# Patient Record
Sex: Female | Born: 1935
Health system: Southern US, Community
[De-identification: ages and names within clinical notes are randomized; demographics above are authoritative.]

## PROBLEM LIST (undated history)

## (undated) DIAGNOSIS — K219 Gastro-esophageal reflux disease without esophagitis: Secondary | ICD-10-CM

## (undated) DIAGNOSIS — E785 Hyperlipidemia, unspecified: Secondary | ICD-10-CM

## (undated) DIAGNOSIS — I10 Essential (primary) hypertension: Secondary | ICD-10-CM

## (undated) DIAGNOSIS — E119 Type 2 diabetes mellitus without complications: Secondary | ICD-10-CM

## (undated) DIAGNOSIS — I340 Nonrheumatic mitral (valve) insufficiency: Secondary | ICD-10-CM

## (undated) DIAGNOSIS — M1990 Unspecified osteoarthritis, unspecified site: Secondary | ICD-10-CM

## (undated) HISTORY — DX: Essential (primary) hypertension: I10

## (undated) HISTORY — PX: SPINE SURGERY: SHX786

## (undated) HISTORY — DX: Type 2 diabetes mellitus without complications: E11.9

## (undated) HISTORY — PX: ABDOMINAL HYSTERECTOMY: SHX81

## (undated) HISTORY — DX: Unspecified osteoarthritis, unspecified site: M19.90

## (undated) HISTORY — DX: Hyperlipidemia, unspecified: E78.5

## (undated) HISTORY — PX: TOTAL HIP ARTHROPLASTY: SHX124

## (undated) HISTORY — DX: Gastro-esophageal reflux disease without esophagitis: K21.9

## (undated) HISTORY — PX: BACK SURGERY: SHX140

## (undated) HISTORY — PX: JOINT REPLACEMENT: SHX530

## (undated) HISTORY — PX: FRACTURE SURGERY: SHX138

## (undated) HISTORY — PX: PARTIAL HYSTERECTOMY: SHX80

## (undated) HISTORY — DX: Nonrheumatic mitral (valve) insufficiency: I34.0

---

## 1998-10-26 ENCOUNTER — Ambulatory Visit (HOSPITAL_COMMUNITY): Admission: RE | Admit: 1998-10-26 | Discharge: 1998-10-26 | Payer: Self-pay | Admitting: Family Medicine

## 2000-03-19 ENCOUNTER — Encounter: Payer: Self-pay | Admitting: Family Medicine

## 2000-03-19 ENCOUNTER — Ambulatory Visit (HOSPITAL_COMMUNITY): Admission: RE | Admit: 2000-03-19 | Discharge: 2000-03-19 | Payer: Self-pay | Admitting: Family Medicine

## 2000-03-25 ENCOUNTER — Other Ambulatory Visit: Admission: RE | Admit: 2000-03-25 | Discharge: 2000-03-25 | Payer: Self-pay | Admitting: Obstetrics and Gynecology

## 2000-04-01 ENCOUNTER — Encounter (INDEPENDENT_AMBULATORY_CARE_PROVIDER_SITE_OTHER): Payer: Self-pay

## 2000-04-01 ENCOUNTER — Ambulatory Visit (HOSPITAL_COMMUNITY): Admission: RE | Admit: 2000-04-01 | Discharge: 2000-04-01 | Payer: Self-pay | Admitting: Obstetrics and Gynecology

## 2000-09-10 ENCOUNTER — Ambulatory Visit (HOSPITAL_COMMUNITY): Admission: RE | Admit: 2000-09-10 | Discharge: 2000-09-10 | Payer: Self-pay | Admitting: Neurosurgery

## 2000-09-10 ENCOUNTER — Encounter: Payer: Self-pay | Admitting: Neurosurgery

## 2000-10-02 ENCOUNTER — Encounter: Payer: Self-pay | Admitting: Neurosurgery

## 2000-10-02 ENCOUNTER — Ambulatory Visit (HOSPITAL_COMMUNITY): Admission: RE | Admit: 2000-10-02 | Discharge: 2000-10-02 | Payer: Self-pay | Admitting: Neurosurgery

## 2000-10-16 ENCOUNTER — Ambulatory Visit (HOSPITAL_COMMUNITY): Admission: RE | Admit: 2000-10-16 | Discharge: 2000-10-16 | Payer: Self-pay | Admitting: Neurosurgery

## 2000-10-16 ENCOUNTER — Encounter: Payer: Self-pay | Admitting: Neurosurgery

## 2000-10-30 ENCOUNTER — Encounter: Payer: Self-pay | Admitting: Neurosurgery

## 2000-10-30 ENCOUNTER — Encounter: Admission: RE | Admit: 2000-10-30 | Discharge: 2000-10-30 | Payer: Self-pay | Admitting: Neurosurgery

## 2000-11-30 ENCOUNTER — Encounter (HOSPITAL_COMMUNITY): Admission: RE | Admit: 2000-11-30 | Discharge: 2000-12-30 | Payer: Self-pay | Admitting: Neurosurgery

## 2001-01-01 ENCOUNTER — Encounter (HOSPITAL_COMMUNITY): Admission: RE | Admit: 2001-01-01 | Discharge: 2001-01-31 | Payer: Self-pay | Admitting: Neurosurgery

## 2001-02-04 ENCOUNTER — Encounter (HOSPITAL_COMMUNITY): Admission: RE | Admit: 2001-02-04 | Discharge: 2001-03-06 | Payer: Self-pay | Admitting: Neurosurgery

## 2001-03-09 ENCOUNTER — Encounter (HOSPITAL_COMMUNITY): Admission: RE | Admit: 2001-03-09 | Discharge: 2001-04-08 | Payer: Self-pay | Admitting: Neurosurgery

## 2001-07-16 ENCOUNTER — Ambulatory Visit (HOSPITAL_COMMUNITY): Admission: RE | Admit: 2001-07-16 | Discharge: 2001-07-16 | Payer: Self-pay | Admitting: Family Medicine

## 2001-07-16 ENCOUNTER — Encounter: Payer: Self-pay | Admitting: Family Medicine

## 2001-08-26 ENCOUNTER — Other Ambulatory Visit: Admission: RE | Admit: 2001-08-26 | Discharge: 2001-08-26 | Payer: Self-pay | Admitting: Specialist

## 2001-10-27 ENCOUNTER — Ambulatory Visit (HOSPITAL_COMMUNITY): Admission: RE | Admit: 2001-10-27 | Discharge: 2001-10-27 | Payer: Self-pay | Admitting: General Surgery

## 2001-11-15 ENCOUNTER — Encounter: Payer: Self-pay | Admitting: Neurosurgery

## 2001-11-15 ENCOUNTER — Ambulatory Visit (HOSPITAL_COMMUNITY): Admission: RE | Admit: 2001-11-15 | Discharge: 2001-11-15 | Payer: Self-pay | Admitting: Neurosurgery

## 2001-12-24 ENCOUNTER — Encounter: Payer: Self-pay | Admitting: Neurosurgery

## 2001-12-28 ENCOUNTER — Encounter: Payer: Self-pay | Admitting: Neurosurgery

## 2001-12-28 ENCOUNTER — Inpatient Hospital Stay (HOSPITAL_COMMUNITY): Admission: RE | Admit: 2001-12-28 | Discharge: 2002-01-03 | Payer: Self-pay | Admitting: Neurosurgery

## 2002-06-14 ENCOUNTER — Encounter (HOSPITAL_COMMUNITY): Admission: RE | Admit: 2002-06-14 | Discharge: 2002-07-14 | Payer: Self-pay | Admitting: Neurosurgery

## 2002-07-19 ENCOUNTER — Encounter (HOSPITAL_COMMUNITY): Admission: RE | Admit: 2002-07-19 | Discharge: 2002-08-18 | Payer: Self-pay | Admitting: Neurosurgery

## 2002-08-30 ENCOUNTER — Encounter (HOSPITAL_COMMUNITY): Admission: RE | Admit: 2002-08-30 | Discharge: 2002-09-29 | Payer: Self-pay | Admitting: Neurosurgery

## 2003-02-08 ENCOUNTER — Encounter: Payer: Self-pay | Admitting: Orthopedic Surgery

## 2003-02-14 ENCOUNTER — Inpatient Hospital Stay (HOSPITAL_COMMUNITY): Admission: RE | Admit: 2003-02-14 | Discharge: 2003-02-17 | Payer: Self-pay | Admitting: Orthopedic Surgery

## 2003-02-14 ENCOUNTER — Encounter: Payer: Self-pay | Admitting: Orthopedic Surgery

## 2003-02-16 ENCOUNTER — Encounter: Payer: Self-pay | Admitting: Orthopedic Surgery

## 2003-02-17 ENCOUNTER — Inpatient Hospital Stay (HOSPITAL_COMMUNITY)
Admission: RE | Admit: 2003-02-17 | Discharge: 2003-03-03 | Payer: Self-pay | Admitting: Physical Medicine & Rehabilitation

## 2003-05-22 ENCOUNTER — Encounter: Payer: Self-pay | Admitting: Orthopedic Surgery

## 2003-05-22 ENCOUNTER — Encounter: Admission: RE | Admit: 2003-05-22 | Discharge: 2003-05-22 | Payer: Self-pay | Admitting: Orthopedic Surgery

## 2003-06-13 ENCOUNTER — Inpatient Hospital Stay (HOSPITAL_COMMUNITY): Admission: RE | Admit: 2003-06-13 | Discharge: 2003-06-16 | Payer: Self-pay | Admitting: Orthopedic Surgery

## 2003-06-16 ENCOUNTER — Inpatient Hospital Stay (HOSPITAL_COMMUNITY)
Admission: RE | Admit: 2003-06-16 | Discharge: 2003-06-23 | Payer: Self-pay | Admitting: Physical Medicine & Rehabilitation

## 2003-09-04 ENCOUNTER — Encounter: Admission: RE | Admit: 2003-09-04 | Discharge: 2003-09-04 | Payer: Self-pay | Admitting: Family Medicine

## 2005-01-07 ENCOUNTER — Encounter: Admission: RE | Admit: 2005-01-07 | Discharge: 2005-01-07 | Payer: Self-pay | Admitting: Family Medicine

## 2005-10-14 ENCOUNTER — Ambulatory Visit (HOSPITAL_COMMUNITY): Admission: RE | Admit: 2005-10-14 | Discharge: 2005-10-14 | Payer: Self-pay | Admitting: Family Medicine

## 2006-03-17 ENCOUNTER — Ambulatory Visit (HOSPITAL_COMMUNITY): Admission: RE | Admit: 2006-03-17 | Discharge: 2006-03-17 | Payer: Self-pay | Admitting: Internal Medicine

## 2007-03-25 ENCOUNTER — Ambulatory Visit (HOSPITAL_COMMUNITY): Admission: RE | Admit: 2007-03-25 | Discharge: 2007-03-25 | Payer: Self-pay | Admitting: Internal Medicine

## 2007-10-01 ENCOUNTER — Ambulatory Visit (HOSPITAL_COMMUNITY): Admission: RE | Admit: 2007-10-01 | Discharge: 2007-10-01 | Payer: Self-pay | Admitting: Internal Medicine

## 2007-10-14 ENCOUNTER — Other Ambulatory Visit: Admission: RE | Admit: 2007-10-14 | Discharge: 2007-10-14 | Payer: Self-pay | Admitting: Obstetrics and Gynecology

## 2007-11-17 ENCOUNTER — Encounter (INDEPENDENT_AMBULATORY_CARE_PROVIDER_SITE_OTHER): Payer: Self-pay | Admitting: Internal Medicine

## 2007-11-17 ENCOUNTER — Ambulatory Visit (HOSPITAL_COMMUNITY): Admission: RE | Admit: 2007-11-17 | Discharge: 2007-11-17 | Payer: Self-pay | Admitting: Internal Medicine

## 2008-08-16 ENCOUNTER — Ambulatory Visit: Payer: Self-pay | Admitting: Cardiology

## 2008-08-16 DIAGNOSIS — R0602 Shortness of breath: Secondary | ICD-10-CM | POA: Insufficient documentation

## 2008-08-21 ENCOUNTER — Ambulatory Visit: Payer: Self-pay | Admitting: Cardiology

## 2008-08-21 ENCOUNTER — Encounter (HOSPITAL_COMMUNITY): Admission: RE | Admit: 2008-08-21 | Discharge: 2008-09-20 | Payer: Self-pay | Admitting: Cardiology

## 2008-08-22 ENCOUNTER — Encounter: Payer: Self-pay | Admitting: Cardiology

## 2008-09-04 ENCOUNTER — Ambulatory Visit: Payer: Self-pay | Admitting: Cardiology

## 2010-03-28 ENCOUNTER — Other Ambulatory Visit: Admission: RE | Admit: 2010-03-28 | Discharge: 2010-03-28 | Payer: Self-pay | Admitting: Obstetrics and Gynecology

## 2010-04-09 ENCOUNTER — Ambulatory Visit (HOSPITAL_COMMUNITY): Admission: RE | Admit: 2010-04-09 | Discharge: 2010-04-09 | Payer: Self-pay | Admitting: Ophthalmology

## 2010-05-21 ENCOUNTER — Emergency Department (HOSPITAL_COMMUNITY): Admission: EM | Admit: 2010-05-21 | Discharge: 2010-05-21 | Payer: Self-pay | Admitting: Emergency Medicine

## 2010-08-17 ENCOUNTER — Encounter: Payer: Self-pay | Admitting: Family Medicine

## 2010-08-18 ENCOUNTER — Encounter: Payer: Self-pay | Admitting: Nephrology

## 2010-08-18 ENCOUNTER — Encounter: Payer: Self-pay | Admitting: Internal Medicine

## 2010-09-19 ENCOUNTER — Other Ambulatory Visit: Payer: Self-pay | Admitting: Obstetrics and Gynecology

## 2010-09-19 ENCOUNTER — Encounter (HOSPITAL_COMMUNITY): Payer: Medicare Other | Attending: Obstetrics and Gynecology

## 2010-09-19 DIAGNOSIS — Z01812 Encounter for preprocedural laboratory examination: Secondary | ICD-10-CM | POA: Insufficient documentation

## 2010-09-19 LAB — CBC
MCH: 29.2 pg (ref 26.0–34.0)
MCHC: 34 g/dL (ref 30.0–36.0)
MCV: 86 fL (ref 78.0–100.0)
Platelets: 216 10*3/uL (ref 150–400)
RBC: 4.35 MIL/uL (ref 3.87–5.11)
RDW: 14.5 % (ref 11.5–15.5)

## 2010-09-19 LAB — DIFFERENTIAL
Eosinophils Absolute: 0.1 10*3/uL (ref 0.0–0.7)
Eosinophils Relative: 2 % (ref 0–5)
Lymphs Abs: 2.2 10*3/uL (ref 0.7–4.0)
Monocytes Absolute: 0.4 10*3/uL (ref 0.1–1.0)
Monocytes Relative: 7 % (ref 3–12)

## 2010-09-19 LAB — COMPREHENSIVE METABOLIC PANEL
ALT: 16 U/L (ref 0–35)
AST: 20 U/L (ref 0–37)
CO2: 27 mEq/L (ref 19–32)
Chloride: 103 mEq/L (ref 96–112)
Creatinine, Ser: 1.02 mg/dL (ref 0.4–1.2)
GFR calc Af Amer: >60 mL/min (ref >60)
GFR calc non Af Amer: 53 mL/min — ABNORMAL LOW (ref >60)
Total Bilirubin: 0.7 mg/dL (ref 0.3–1.2)

## 2010-09-24 ENCOUNTER — Ambulatory Visit (HOSPITAL_COMMUNITY)
Admission: RE | Admit: 2010-09-24 | Discharge: 2010-09-24 | Disposition: A | Discharge status: Home / Self Care | Payer: Medicare Other | Source: Ambulatory Visit | Attending: Obstetrics and Gynecology | Admitting: Obstetrics and Gynecology

## 2010-09-24 DIAGNOSIS — Z538 Procedure and treatment not carried out for other reasons: Secondary | ICD-10-CM | POA: Insufficient documentation

## 2010-09-24 DIAGNOSIS — N816 Rectocele: Secondary | ICD-10-CM | POA: Insufficient documentation

## 2010-09-24 DIAGNOSIS — N8110 Cystocele, unspecified: Secondary | ICD-10-CM | POA: Insufficient documentation

## 2010-10-04 ENCOUNTER — Encounter (HOSPITAL_COMMUNITY): Payer: Medicare Other | Attending: Obstetrics and Gynecology

## 2010-10-04 ENCOUNTER — Other Ambulatory Visit: Payer: Self-pay | Admitting: Obstetrics and Gynecology

## 2010-10-04 DIAGNOSIS — Z01812 Encounter for preprocedural laboratory examination: Secondary | ICD-10-CM | POA: Insufficient documentation

## 2010-10-06 NOTE — H&P (Signed)
  NAME:  Jasmine Bridges, Jasmine Bridges NO.:  [id number]  MEDICAL RECORD NO.:  [id number]           PATIENT TYPE:  LOCATION:                                 FACILITY:  PHYSICIAN:  Tilda Burrow, M.D. DATE OF BIRTH:  [birth year]  DATE OF ADMISSION: DATE OF DISCHARGE:  LH                             HISTORY & PHYSICAL   ADMITTING DIAGNOSIS:  Uterine descent, uterine prolapse with cystourethrocele and rectocele.  HISTORY OF PRESENT ILLNESS:  This 77 year old female, many years postmenopausal, patient of Dr. Catalina Pizza, with known cystocele and uterine descensus, has now been scheduled for vaginal hysterectomy, and anterior and posterior repair.  This was rescheduling of procedure originally planned for September 19, 2010, but cancelled due to inadequate bowel prep and due to miscommunications.  The patient has now been rescheduled and completed bowel prep.  Risks of procedure have been reviewed adequately with the patient, questions answered.  PAST MEDICAL HISTORY:  Medical history is positive for hypertension. Surgical history of D and C that was pregnancy related, premenopausal many years ago;  back operations in 2003 and two prior episodes of hip replacements bilaterally.  No GYN surgery.  She is a gravida 4, para 4-0- 0-3.  She has no allergies.  She does have some chronic medical history additionally identified as chronic renal insufficiency; diabetes mellitus, type 2; leg and back pain; hyperlipidemia; history of urinary tract infections; history of gout; history of intermittent problems with vertigo, currently stable.  PHYSICAL EXAMINATION:  Notable for the adequate introitus support but with uterine prolapse through the introitus with activity, spontaneously reducible when at rest.  PLAN:  Vaginal hysterectomy, anterior and posterior repair, possible removal of tubes and ovaries depending on accessibility.  Risks of procedure including injury to adjacent organs  reviewed with the patient who was comfortable with procedure.  Bowel prep performed.  Plan vaginal hysterectomy on October 08, 2010, at [facility].     Tilda Burrow, M.D.     JVF/MEDQ  D:  10/03/2010  T:  10/04/2010  Job:  [id number]  Electronically Signed by Christin Bach M.D. on 10/06/2010 12:30:57 PM

## 2010-10-08 ENCOUNTER — Inpatient Hospital Stay (HOSPITAL_COMMUNITY)
Admission: RE | Admit: 2010-10-08 | Discharge: 2010-10-09 | DRG: 743 | Disposition: A | Discharge status: Home / Self Care | Payer: Medicare Other | Source: Ambulatory Visit | Attending: Obstetrics and Gynecology | Admitting: Obstetrics and Gynecology

## 2010-10-08 ENCOUNTER — Other Ambulatory Visit: Payer: Self-pay | Admitting: Obstetrics and Gynecology

## 2010-10-08 DIAGNOSIS — N814 Uterovaginal prolapse, unspecified: Principal | ICD-10-CM | POA: Diagnosis present

## 2010-10-08 DIAGNOSIS — I10 Essential (primary) hypertension: Secondary | ICD-10-CM | POA: Diagnosis present

## 2010-10-09 LAB — BASIC METABOLIC PANEL
Calcium: 8.8 mg/dL (ref 8.4–10.5)
Calcium: 9.1 mg/dL (ref 8.4–10.5)
Creatinine, Ser: 0.99 mg/dL (ref 0.4–1.2)
GFR calc Af Amer: >60 mL/min (ref >60)
GFR calc Af Amer: 56 mL/min — ABNORMAL LOW (ref >60)
GFR calc non Af Amer: 46 mL/min — ABNORMAL LOW (ref >60)
Sodium: 135 mEq/L (ref 135–145)

## 2010-10-09 LAB — DIFFERENTIAL
Basophils Relative: 0 % (ref 0–1)
Basophils Relative: 0 % (ref 0–1)
Eosinophils Absolute: 0.1 10*3/uL (ref 0.0–0.7)
Lymphs Abs: 2.3 10*3/uL (ref 0.7–4.0)
Monocytes Absolute: 1.1 10*3/uL — ABNORMAL HIGH (ref 0.1–1.0)
Monocytes Relative: 12 % (ref 3–12)
Monocytes Relative: 5 % (ref 3–12)
Neutro Abs: 9.7 10*3/uL — ABNORMAL HIGH (ref 1.7–7.7)
Neutrophils Relative %: 67 % (ref 43–77)
Neutrophils Relative %: 75 % (ref 43–77)

## 2010-10-09 LAB — CBC
Hemoglobin: 12.6 g/dL (ref 12.0–15.0)
MCH: 29.8 pg (ref 26.0–34.0)
MCHC: 34.6 g/dL (ref 30.0–36.0)
MCHC: 33.4 g/dL (ref 30.0–36.0)
Platelets: 185 10*3/uL (ref 150–400)
RBC: 4.26 MIL/uL (ref 3.87–5.11)

## 2010-10-10 LAB — POCT I-STAT 4, (NA,K, GLUC, HGB,HCT): Potassium: 3.5 mEq/L (ref 3.5–5.1)

## 2010-10-10 LAB — CROSSMATCH
ABO/RH(D): A POS
Antibody Screen: NEGATIVE
Unit division: 0
Unit division: 0

## 2010-10-11 NOTE — Op Note (Signed)
NAME:  Jasmine Bridges, Jasmine Bridges                  ACCOUNT NO.:  [id number]  MEDICAL RECORD NO.:  [id number]           PATIENT TYPE:  I  LOCATION:  A339                          FACILITY:  [facility]  PHYSICIAN:  Tilda Burrow, M.D. DATE OF BIRTH:  03/13/1933  DATE OF PROCEDURE:  10/08/2010 DATE OF DISCHARGE:                              OPERATIVE REPORT   PREOPERATIVE DIAGNOSES:  Uterine prolapse, cystourethrocele, small rectocele.  PREOPERATIVE DIAGNOSES:  Uterine prolapse, cystourethrocele, small rectocele.  PROCEDURE:  Vaginal hysterectomy, bilateral salpingo-oophorectomy, anterior and posterior repair.  SURGEON:  Tilda Burrow, M.D.  ASSISTANTBirdie Riddle, CST  ANESTHESIA:  Spinal with Reuel Boom, CRNA  COMPLICATIONS:  None.  FINDINGS:  Second degree cystourethrocele and cervical protrusion through the introitus, small rectocele, tiny ovaries bilaterally.  DETAILS OF THE PROCEDURE:  The patient was taken to the operating room. Spinal anesthesia introduced.  The patient prepped and draped positioned properly in high lithotomy support and a time-out conducted and procedure confirmed by all involved parties.  A circumferential incision around the cervix was performed using sharp knife dissection after injection with Marcaine with epinephrine and then a posterior colpotomy incision performed.  The bladder could be sharply dissected off the anterior lower uterine segment and elevated easily.  The uterosacral ligaments could be identified on either side, clamped, cut, and suture ligated and tagged for future identification.  The lower cardinal ligaments were then clamped, cut, and suture ligated being maintaining particular care to stay close to the uterus and take small bites.  The upper cardinal ligament was serially taken down with small bites in similar fashion and then the broad ligament serially clamped, cut, and suture ligated.  Prior to working on the broad ligament, the  anterior vesicouterine peritoneum was opened by placing a finger around the uterine fundus identifying the peritoneum and opening it near to the uterus.  Upon reaching the level of the round ligaments, the pedicle was clamped, cut, ligated, and tied and then the tip of the utero-ovarian ligament and fallopian tube taken down as a separate specimen.  This was also tagged.  The uterus was removed and using Allis clamps for traction and counter traction, we could identify first the left tube and ovary. There was some thin filmy adhesions attaching the ovary to the sidewall, but these could be mobilized, so that the and ovary could be clearly visualized and then specimen crossclamped with Zeppelin clamp.  The pedicle was ligated with 0 chromic maintaining close proximity to the ovary.  The right tube and ovary was clearly visible without any adhesions and easily identified with the mesosalpinx taken down and tied with good hemostasis.  Specimens were passed off.  A small suture of 2-0 Prolene was then placed grasping the inner aspects of the uterosacral ligament on each side, going from lateral to medial and incorporating a small portion of the rectovaginal septum in order to reduce potential for subsequent postop enterocele.  These were tied down and cut shortly.  Anterior peritoneum was then closed to the posterior cul-de-sac peritoneum, and then sponge and needle counts complete, correct for peritoneal closure.  The anterior vaginal mucosa was then injected with an additional 10 mL of Marcaine solution.  The anterior epithelium dissected off of the underlying bladder.  Prior to beginning this dissection, the Foley catheterization was performed and 400 mL of clear urine obtained.  The redundant anterior vaginal epithelium was trimmed and then reapposed by pulling the remaining tissue to the vaginal cuff and suturing it with small interrupted bites of 2-0 chromic and then the midline  dissection was reapproximated with running 0 chromic.  This resulted in good anterior support.  Posterior repair.  Posterior repair consisted of infiltrating with another 10 mL of Marcaine with epinephrine in the rectovaginal septum, splitting the vaginal epithelium in a U-shaped incision just inside the introitus and bluntly dissecting the tissues laterally with small amount of sharp dissection to identify good tissue on either side.  With a double gloved right index finger inside the rectum for proper orientation, 2 Allis clamps could be placed on the lateral tissues that were felt to represent part of the levator plate that could be pulled into the midline with a series of interrupted sutures of 2-0 Vicryl, resulting in good, posterior perineal body support.  Total of 4 sutures were used.  The vaginal epithelium was trimmed slightly and pulled together in the midline with 2-0 chromic.  Sponge and needle counts were correct.  The patient tolerated the procedure well, went to recovery room in good condition with EBL 100 mL.  An additional 200 mL of urine was obtained during the duration of the case.     Tilda Burrow, M.D.     JVF/MEDQ  D:  10/08/2010  T:  10/09/2010  Job:  [id number]  cc:   [facility]  Electronically Signed by Christin Bach M.D. on 10/11/2010 01:45:31 PM

## 2010-11-08 NOTE — Discharge Summary (Signed)
  NAME:  Jasmine Bridges, Jasmine Bridges                  ACCOUNT NO.:  [id number]  MEDICAL RECORD NO.:  [id number]           PATIENT TYPE:  LOCATION:                                 FACILITY:  PHYSICIAN:  Tilda Burrow, M.D. DATE OF BIRTH:  [birth year]  DATE OF ADMISSION:  10/09/2010 DATE OF DISCHARGE:  03/15/2012LH                              DISCHARGE SUMMARY   ADMITTING DIAGNOSES: 1. Uterine prolapse. 2. Cystocele. 3. Rectocele. 4. History of Staphylococcus aureus carrier, status post Bactroban     treatment.  DISCHARGE DIAGNOSES: 1. Uterine prolapse. 2. Cystocele. 3. Rectocele. 4. History of Staphylococcus aureus carrier, status post Bactroban     treatment.  PROCEDURES:  Vaginal hysterectomy, bilateral salpingo-oophorectomy, anterior and posterior repair.  DISCHARGE MEDICATIONS:  New ones: 1. Ibuprofen 600 mg q.6 h. p.r.n. pain. 2. MiraLax 17 g by mouth daily for prevention of constipation. 3. Oxycodone/APAP 5/325 one p.o. q.6 h. p.r.n. pain  Continued medications: 1. Allopurinol 100 mg 1 p.o. daily. 2. Alprazolam 0.25 mg 1 p.o. at bedtime p.r.n. 3. Meclizine 25 mg p.o. daily as needed. 4. Multivitamins 1 daily. 5. Neurontin (gabapentin) 300 mg oral solution twice daily. 6. Spirolactone 25 mg p.o. daily. 7. Torsemide 1-2 tablets daily as needed for leg cramps.  HOSPITAL SUMMARY:  This 77 year old female was admitted for vaginal hysterectomy, bilateral salpingo-oophorectomy, A and P repair as described in the HPI.  The patient underwent hysterectomy in an uncomplicated fashion with 100 mL of EBL.  Postoperatively, the patient remained stable overnight, good clear urine obtained.  Pain control was quite satisfactory.  Following day the patient was discharged with postoperative hemoglobin 11, hematocrit 33, platelets 185,000, and white count 9700.  Admitting hemoglobin 12.7.  Glucose was 106, potassium 4.4.  Blood pressure 100/70, pulse 80.  Abdomen was soft . vaginal packing  was removed.  The patient was able to void spontaneously.  She will be followed up in 1 week, then 4 weeks at [facility].     Tilda Burrow, M.D.     JVF/MEDQ  D:  11/04/2010  T:  11/05/2010  Job:  [id number]  Electronically Signed by Christin Bach M.D. on 11/08/2010 02:41:13 PM

## 2010-12-10 NOTE — Assessment & Plan Note (Signed)
[facility]                       [place] [facility] OFFICE NOTE   NAME:Richens, AMARE KONTOS                         MRN:          [id number]  DATE:08/16/2008                            DOB:          [birth year]    REQUESTING PHYSICIAN:  Catalina Pizza, MD   REASON FOR CONSULTATION:  Progressive shortness of breath.   HISTORY OF PRESENT ILLNESS:  Ms. Kyte is a pleasant 77 year old woman  with a history of hypertension, hyperlipidemia, chronic intermittent  lower extremity edema, and chronic back pain with previous back surgery  several years ago.  She reports a 2 to 3-year history of progressive  shortness of breath, now NYHA class III in severity.  She has not had  any problems with exertional chest pain and denies orthopnea and PND.  She does have lower extremity edema that is dependent and usually better  when she is able to rest and put her feet up.  She has no personal  history of ischemic heart disease or myocardial infarction.  I was able  to review records and noted an echocardiogram done back in April of last  year demonstrating a left ventricular ejection fraction in the normal  range with no regional wall motion abnormalities.  Moderate mitral  regurgitation was described at that time.  Her electrocardiogram today  shows sinus rhythm with premature atrial complexes and nonspecific ST-T  wave changes.  She is referred for further evaluation.   ALLERGIES:  No known drug allergies.   PRESENT MEDICATIONS:  1. Potassium chloride 10 mEq p.o. daily.  2. Meloxicam 7.5 mg p.o. b.i.d.  3. Gabapentin 300 mg p.o. b.i.d.  4. Spirolactone 25 mg p.o. daily,  5. Torsemide 20 mg p.o. daily.  6. Simvastatin 20 mg p.o. daily.  7. Alprazolam 0.25 mg p.o. nightly.  8. Multivitamin 1 p.o. daily.  9. Calcium citrate with vitamin D 250 mg p.o. daily.  10.Alendronate 70 mg weekly.   PAST MEDICAL HISTORY:  As detailed above.  She has undergone prior hip  replacement and back operations in [place].  She has known  osteoarthritis and acid reflux.   SOCIAL HISTORY:  No active tobacco abuse or alcohol use.  She has not  been exercising regularly, but did do some water aerobics in last  summer.   FAMILY HISTORY:  Reviewed and significant for cardiovascular disease.  Her father died with myocardial infarction suddenly at age of 46 and she  also reports that she has a brother who suffered with cardiovascular  disease.  She states that her daughter was diagnosed with mitral valve  prolapse.   REVIEW OF SYSTEMS:  As outlined above.  She has had some trouble with  constipation and also leg heaviness since taking Zocor which she  apparently was recently placed back on.  She has occasional reflux  symptoms.  Reports her last mammogram in 2009.  No claudication  symptoms.  She does have a lot of trouble with her back complaining of  pain when she bends over and does any short of housework.  Otherwise  negative.   PHYSICAL EXAMINATION:  VITAL SIGNS:  Blood pressure is 136/79, heart  rate is 80-90 and regular, and weight is 201 pounds.  GENERAL:  This is an overweight woman in no acute distress.  HEENT:  Conjunctivae are normal.  Oropharynx is clear.  NECK:  Supple.  No elevated jugular venous pressure.  No loud bruits.  LUNGS:  Clear without labored breathing at rest.  CARDIAC:  Regular rate and rhythm with what sounds like a  physiologically split S2.  Soft apical systolic murmur.  No pericardial  rub.  ABDOMEN:  Nontender.  Normoactive bowel sounds.  No hepatomegaly.  EXTREMITIES:  Exhibit trace symmetrical edema.  Distal pulses are 1+.  SKIN:  Warm and dry.  MUSCULOSKELETAL:  No kyphosis noted.  NEUROPSYCHIATRIC:  The patient alert and oriented x3.  Affect is  appropriate   IMPRESSION AND RECOMMENDATIONS:  Progressive shortness of breath, now  described at [facility] class III.  No exertional chest  pain is reported,  otherwise only occasional palpitations, but no  syncope.  Electrocardiogram is nonspecific at this point with occasional  premature atrial complexes.  She does have a longstanding history of  hypertension and also hyperlipidemia.  No prolonged tobacco use history.  In reviewing her records, I find an echocardiogram from last April,  which did describe moderate mitral regurgitation by report.  I discussed  this with the patient today. Would further evaluate with a repeat  echocardiogram to assess both degree of mitral regurgitation and also  assessment of left and right ventricular function.  Ischemic evaluation  will be obtained via a Lexiscan  Myoview on medical therapy.  I will  bring her back to the office over the next few weeks to discuss the  results.     Jonelle Sidle, MD  Electronically Signed    SGM/MedQ  DD: 08/16/2008  DT: 08/17/2008  Job #: [id number]   cc:   Catalina Pizza, M.D.

## 2010-12-10 NOTE — Assessment & Plan Note (Signed)
[facility]                       [place] [facility] OFFICE NOTE   NAME:Jasmine Bridges, Jasmine Bridges                         MRN:          [id number]  DATE:09/04/2008                            DOB:          12-25-1932    PRIMARY CARE PHYSICIAN:  Catalina Pizza, MD.   REASON FOR VISIT:  Followup cardiac testing.   HISTORY OF PRESENT ILLNESS:  I saw Jasmine Bridges back in late January.  She  was referred at that time with progressive shortness of breath.  She had  had previously documented moderate mitral regurgitation by prior  echocardiography.  She also has a family history of cardiovascular  disease.  I referred her for ischemic testing via a Lexiscan Myoview.  This was performed on January 25 and was reassuring demonstrating no  frank evidence of ischemia with an ejection fraction of 68%.  Echocardiography was also performed confirming normal left ventricular  systolic function and revealing only trivial mitral regurgitation with  mild mitral annular calcification.  I reviewed these studies with Ms.  Bridges today and reassured her.  It may well be that some of her  shortness of breath is related to congenital conditioning and lack of  regular exercise.   ALLERGIES:  No known drug allergies.   MEDICATIONS:  1. Potassium chloride 10 mEq p.o. daily.  2. Meloxicam 7.5 mg p.o. b.i.d.  3. Gabapentin 300 mg p.o. b.i.d.  4. Spirolactone 25 mg p.o. daily.  5. Torsemide 20 mg p.o. daily.  6. Simvastatin 10 mg p.o. daily.  7. Alprazolam 0.25 mg p.o. at bedtime.  8. Multivitamin 1 p.o. daily.  9. Calcium citrate with vitamin D 250 mg p.o. daily.  10.Alendronate 70 mg weekly.   REVIEW OF SYSTEMS:  As outlined above.  She states that she has cut her  simvastatin back from 20 mg to 10 mg daily and has had problem with  spotting and also leg discomfort.  Otherwise negative.   PHYSICAL EXAMINATION:  VITAL SIGNS:  Blood pressure 128/70, heart rate  is 70, weight is 201  pounds.  GENERAL:  The patient is comfortable, in no acute distress.  NECK:  No elevated jugular venous pressure.  No bruits.  No thyromegaly.  LUNGS:  Clear without labored breathing at rest.  CARDIAC:  Regular rate and rhythm, physiologically split S2.  Soft  apical systolic murmur without change.  EXTREMITIES:  Exhibit trace edema, nonpitting, 1+ distal pulses are  noted.   IMPRESSION AND RECOMMENDATIONS:  Shortness of breath without clear  evidence of underlying ischemic heart disease or progressive valvular  heart disease based on recent testing.  The patient has normal left  ventricular systolic function and only trace mitral regurgitation at  this point.  I have recommended that she return to her prior exercise  regimen, which previously included water aerobics.  It may be that some  of her symptoms are related to general deconditioning.  Otherwise, basic  cardiac risk factor modification is recommended.  Since she has had some  complaints even with low-dose Zocor, I have asked her to hold this until  she is able  to see Dr. Margo Aye back in followup.  We can see her as needed.     Jonelle Sidle, MD  Electronically Signed    SGM/MedQ  DD: 09/04/2008  DT: 09/05/2008  Job #: [id number]   cc:   Catalina Pizza, M.D.

## 2010-12-13 NOTE — H&P (Signed)
NAME:  Jasmine Bridges, Jasmine Bridges                            ACCOUNT NO.:  [id number]   MEDICAL RECORD NO.:  [id number]                   PATIENT TYPE:  INP   LOCATION:  NA                                   FACILITY:  [facility]   PHYSICIAN:  Rodney A. Chaney Malling, M.D.           DATE OF BIRTH:  October 20, 1932   DATE OF ADMISSION:  02/14/2003  DATE OF DISCHARGE:                                HISTORY & PHYSICAL   CHIEF COMPLAINT:  Bilateral hip pain.   HISTORY OF PRESENT ILLNESS:  The patient is a 77 year old female who had  bilateral hip pain over the past four to five months.  She states that her  hip is more bothersome than her right. She does note some popping and  locking in her left hip and a giving way sensation.  The pain is aggravated  by walking and rising from a chair and it radiates into her buttocks.  She  states that she gets very tired whenever she walks and then her she becomes  short of breath. The patient uses a walker to get around since having back  surgery in June of 2003.  She currently takes Darvocet, Neurontin, Vioxx,  Duragesic patch, and propoxyphen for pain in her back, knees, and hips.  X-  rays of her hips show end-stage osteoarthritis bilaterally with femoral head  collapse bilaterally.  The patient will undergo left total hip replacement  on February 14, 2003, by Dr. Chaney Malling at [facility].   ALLERGIES:  No known drug allergies.   CURRENT MEDICATIONS:  1. Darvocet-N 100 tablets p.r.n.  2. Protoxin N/APAP 100/650 three times a day.  3. Neurontin 300 mg capsule b.i.d.  4. Duragesic patch 25 mcg one patch every three days.  5. Fosamax 80 mg one tablet a week.  6. Vioxx 25 mg two tablets daily.  7. Spironolactone 25 mg daily.  8. Chlorocon 10 mEq one tablet daily.  9. Potassium chloride 10 mEq one tablet daily.  10.      Iron plus vitamin C 200 mg/125 two tablets daily.  11.      Calcium 500 mg three tablets daily.  12.      Stool softener two capsules daily.   PAST MEDICAL HISTORY:  1. Osteoarthritis bilateral hips.  2. Anemia.  3. Osteoporosis.   PAST SURGICAL HISTORY:  Surgery for ruptured disk in back x2.  Fusion of  lower back.  Cyst removal from spine.  Spur removal from spine. All of the  last three were done in June of 2003.  The patient reports no complications  with the above surgeries and no blood transfusions were required.   SOCIAL HISTORY:  The patient denies any tobacco use, alcohol use.  She is  widowed and has three adult children. Family physician is Annia Friendly. Loleta Chance,  M.D. Phone number is [phone].  The patient lives in a [place] home with  two steps to the usual entrance. The patient lives alone and would like  rehab if necessary.  The patient is retired, Statistician.   FAMILY HISTORY:  Mother deceased at age 33 with CVA.  Father deceased at 70  with MI. He also had a PE five years prior to his MI.  The patient has one  brother who is living at age 15 and only known health problem is back issues  that have required five back surgeries.   REVIEW OF SYSTEMS:  Positive for glasses.  The patient denies any PND,  orthopnea.  She does experience some exertional dyspnea and feels that this  is mainly due to the pain of trying to walk.  She does state that she has  been told at times that she has an irregular heartbeat and murmur, however,  lately neither her internalist or her neurosurgeon have heard a murmur.  The  patient does have nocturia x2.  She reports having a colonoscopy in 2003  that was negative.  Stress test three years ago was reported by the patient  to also be negative. Otherwise review of systems is negative.   PHYSICAL EXAMINATION:  GENERAL: The patient is a well-developed, well-  nourished, slightly overweight, black female who walks with an antalgic  gait. She has mild valgus deformities of both knees.  The patient uses a  walker and moves slowly to the examination room. The patient's mood and  affect  are appropriate. The patient talks easily with examiner.  VITAL SIGNS:  Height 5 foot 4-1/4 inches.  Weight 180 pounds.  Temperature  96 degrees, pulse 64, respiratory rate 18, blood pressure 142/80.  HEART: Regular rate and rhythm, no murmurs, rubs, or gallops noted.  LUNGS:  Clear to auscultation bilaterally without wheezes or rhonchi.  ABDOMEN:  Soft, nontender, positive bowel sounds in four quadrants.  NECK:  Trachea midline, no lymphadenopathy. Carotids 2+ without bruits.  No  tenderness along C-spine.  Full range of motion of the cervical spine.  BACK:  Back is nontender to palpation.  No TNL signs.  BREASTS:  GENITOURINARY:  RECTAL:  All deferred at this time.  HEENT:  Normocephalic and atraumatic without frontal or maxillary sinus  tenderness to palpation. Conjunctivae are pink. Sclerae nonicteric. PERRLA.  Extraocular movements intact. No visible external deformities.  Left TM is  pearly and gray.  Right ear TM is obscured by heavy cerumen.  Nose and nasal  septum midline. Nasal mucosa pink and moist without polyps. Buccal mucosa  pink and moist.  Good dentition. Pharynx without erythema or exudate. Tongue  and uvula are midline.  MUSCULOSKELETAL:  Upper extremities full range of motion. Neurovascularly  intact.  Radial pulses are 2+ bilaterally and are equal and symmetric.  Lower extremities; left hip internal rotation is nonexistent.  Limited  external rotation.  Right hip limited to internal and external rotation.  Hip flexion to approximately 90 degrees.  Right and left knees medial  compartment tenderness to palpation. Mild crepitus in both legs. The patient  fully extends both knees and flexes to 120 degrees.  Varus stressing reveals  no instability.  Dorsal pedal pulses 2+ bilaterally.  NEUROLOGY:  Alert and oriented x3.  Cranial nerves II-XII grossly intact.  5/5 upper extremity strength.  Upper DTR's 2+ bilaterally.  Lower extremities bilaterally strength 5/5.  Lower  DTR's are 2+ bilaterally.   IMPRESSION:  1. Osteoarthritis bilateral hips.  2. Probable osteoarthritis of both knees.  3. Anemia.  4. Osteoporosis.  The patient is to undergo left total hip replacement on February 14, 2003.  The  patient is given one unit of autologous blood.  She lives alone and requests  rehab after surgery.     Richardean Canal, P.A.                       Rodney A. Chaney Malling, M.D.    GC/MEDQ  D:  02/08/2003  T:  02/08/2003  Job:  [id number]

## 2010-12-13 NOTE — Op Note (Signed)
NAME:  Jasmine Bridges, Jasmine Bridges                            ACCOUNT NO.:  [id number]   MEDICAL RECORD NO.:  [id number]                   PATIENT TYPE:  INP   LOCATION:  2550                                 FACILITY:  [facility]   PHYSICIAN:  Rodney A. Chaney Malling, M.D.           DATE OF BIRTH:  10-30-1932   DATE OF PROCEDURE:  06/13/2003  DATE OF DISCHARGE:                                 OPERATIVE REPORT   PREOPERATIVE DIAGNOSES:  Severe osteoarthritis, right hip.   POSTOPERATIVE DIAGNOSES:  Severe osteoarthritis, right hip.   OPERATION PERFORMED:  Total hip replacement on the right using a 12 mm  femoral small component with a 100 series acetabular Pinnacle cup, 54 mm  outside diameter with Apex hole eliminator and a Pinnacle Marathon  acetabular liner and a +5 32 mm femoral head.   SURGEON:  Lenard Galloway. Chaney Malling, M.D.   ANESTHESIA:  General.   ASSISTANT:  1. Claude Manges. Cleophas Dunker, M.D.  2. Jamelle Rushing, P.A.   DESCRIPTION OF PROCEDURE:  The patient was placed on the operating table in  the supine position.  After satisfactory general anesthesia, the patient was  placed on the table in full lateral position.  The right hip was prepped  with DuraPrep and draped out in the usual manner.  A Vi-drape was placed  over the operative site.  A posterior Southern incision was made.  Skin  edges were retracted.  Bleeders were coagulated.  The tensor fascia lata was  split and the external hip rotators were identified and isolated.  A tag was  placed through the piriformis and this was released.  An incision was made  through the capsule from superior to inferior and excellent access to the  femoral head and neck was achieved.  The hip was then dislocated.  Using a  power saw, the femoral neck was then amputated at what was felt to be the  appropriate length, one fingerbreadth above the lesser trochanter.  Excellent exposure was achieved.  A series of fully fluted reamers then  passed down the femoral  canal and this was reamed out to an 11.5. A series  of broaches then passed down the femoral canal and this finalized with a 12  mm femoral component of a small size and this seated very nicely in the  calcar.  Calcar reamer was then used to smooth off the calcar.  The broach  was then removed.   Attention was then turned to the acetabulum.  Several osteophytes on the  acetabulum were removed.  A series of cheese cut reamers was then used and  this was reamed out to a 53 mm outside diameter.  Good bleeding bone was  achieved and nice depth was achieved.  A 52 trial seated very nicely and a  54 trial hung up on the rim.  The hip was irrigated with copious amounts of  antibiotic solution.  The final  Pinnacle 100 series acetabular cup 54 mm  outside diameter was driven down into the acetabulum.  An excellent scratch  fit was achieved.  This was extremely stable.  A trial of acetabular linings  were inserted and were screwed in place.  A femoral broach was passed down  the femoral canal.  Various sized necks were used.  The +5 mm neck seated  very nicely.  This made the leg lengths equal.  There was excellent stiff  leg in all positions. This was felt to be the appropriate size.  The hip was  then dislocated.  All the trial components were removed.  The hip was  irrigated again with antibiotic solution.  The Apex hole eliminator was  inserted.  A final Pinnacle Marathon acetabular liner was snap fitted in  place.  Femoral component was then passed down the femoral canal and seated  nicely in the calcar.  The +5 head was placed over the trainer and cold-  welded in place.  The hip was again reduced and again put through a full  range of motion.  There is excellent stability throughout.  The posterior  hip capsule was closed with heavy Ethibond sutures.  Ethibond was used to  close the tensor fascia lata and heavy Vicryl was used to close the deep and  subcutaneous tissues in several layers.   Stainless steel staples were then  used to close the skin.  Sterile dressing was applied.  The patient was then  transferred to the recovery room in excellent condition.  Technically this  procedure went extremely well.   DRAINS:  None.   COMPLICATIONS:  None.                                               Rodney A. Chaney Malling, M.D.    RAM/MEDQ  D:  06/13/2003  T:  06/13/2003  Job:  [id number]

## 2010-12-13 NOTE — H&P (Signed)
[place]  Patient:    Jasmine Bridges, Jasmine Bridges Visit Number: [id number] MRN: [id number]          Service Type: SUR Location: 3000 3027 01 Attending Physician:  Emeterio Reeve Dictated by:   Payton Doughty, M.D. Admit Date:  12/28/2001                           History and Physical  ADMITTING DIAGNOSIS:  Spondylosis at L4-5, L5-S1.  SERVICE:  Neurosurgery.  HISTORY OF PRESENT ILLNESS:  [age]-year-old right-handed black lady who, for the past few months, had increasing discomfort in her hip laterally and got into her back with a heavy feeling when she has been up for a while.  She feels as if she has to sit down.  She has numbness in her left foot, which is a residual of an old diskectomy by Drs. Ames and [employer]. [place].  She has had progressive difficulty with walking and an MRI shows she has a slip at L4-5 with a large synovial cyst, effacement of the anterior epidural space at 4-5 and a large broad-based disk with spurring and compression of both 5 roots.  She is barely ambulatory and is therefore admitted for decompression and fusion.  MEDICAL HISTORY:  Remarkable for hypertension.  MEDICATIONS: 1. Maxzide once a day. 2. Alprazolam 0.25 mg a day. 3. Prempro 0.625 mg a day. 4. Vioxx 25 mg a day. 5. An aspirin a day. 6. Vitamin E. 7. Tylenol and Aleve on a p.r.n. basis.  ALLERGIES:  She has no allergies.  PAST SURGICAL HISTORY:  Lumbar diskectomy at 4-5 in 1987 and 1994 and a D&C last November.  SOCIAL HISTORY:  She does not smoke or drink, is now a part-time Lawyer in [place].  FAMILY HISTORY:  Mama died of a stroke, daddy died of a heart attack, ages are not given.  REVIEW OF SYSTEMS:  Remarkable for glasses, nasal congestion, ______, sinus problems, hypertension, heart murmur, swelling in her hands and feet, leg pain when she is walking, shortness of breath, arm weakness, leg weakness, back pain, leg pain  and arthritis.  PHYSICAL EXAMINATION:  HEENT:  Within normal limits.  NECK:  She has reasonable range of motion in her neck.  CHEST:  Clear.  CARDIAC:  Regular rate and rhythm.  ABDOMEN:  Abdomen is nontender with no hepatosplenomegaly, although slightly large.  EXTREMITIES:  Without clubbing or cyanosis.  GU:  Deferred.  PERIPHERAL PULSES:  Good.  NEUROLOGIC:  She is awake, alert and oriented.  Her cranial nerves are intact. Motor exam shows 5/5 strength throughout the upper and lower extremities. There is no current sensory deficit.  Reflexes are a flicker at the knees and absent at the ankles.  Straight leg raise is positive at this time.  Walking causes a marked increase in her pain in both L5 and S1 distributions bilaterally as well as a feeling that her knees are buckling.  LABORATORY AND ACCESSORY DATA:  MRI results have been reviewed above.  CLINICAL IMPRESSION:  Lumbar spondylosis with neurogenic claudication causing gait deficit.  PLAN:  The plan is for lumbar laminectomy, diskectomy and posterior lumbar interbody fusion with Ray threaded fusion cages at L4-5 and L5-S1.  This will be augmented with pedicle screws.  The risks and benefits of this approach have been discussed with her and she wishes to proceed. Dictated by:   Payton Doughty, M.D. Attending Physician:  Emeterio Reeve DD:  12/28/01 TD:  12/29/01 Job: [id number]

## 2010-12-13 NOTE — Op Note (Signed)
[facility]  Patient:    Jasmine Bridges, Jasmine Bridges                         MRN: [id number] Proc. Date: 01/30/00 Adm. Date:  [id number] Attending:  Morene Antu                           Operative Report  PREOPERATIVE DIAGNOSIS:       Postmenopausal bleeding.  POSTOPERATIVE DIAGNOSIS:      Postmenopausal bleeding.  OPERATION:                    D&C and hysteroscopy.  SURGEON:                      Sherry A. Rosalio Macadamia, M.D.  ANESTHESIA:                   General.  INDICATIONS:                  This is a 77 year old G4, P3-1-0-3 woman who has been on Prempro for over 11 years.  The patient had 1-1/2 weeks of vaginal bleeding that has become very light and only with brown spotting at this time. The patient was seen in the office.  However, she refused to have an endometrial biopsy performed in the office.  The patient has a morbid fear of this procedure and would not allow this.  Therefore, the patient is brought to the operating room for [facility] and hysteroscopy.  FINDINGS:                     Eight to nine weeks size uterus, anteflexed.  No adnexal mass.  No polyp or abnormal endometrial tissue seen.  DESCRIPTION OF PROCEDURE:     The patient was brought into the operating room and given an adequate general anesthesia.  She was placed in the dorsolithotomy position.  The perineum and vagina were washed with Betadine. The bladder was in and out catheterized.  The patient was draped in a sterile fashion.  A speculum was placed into the vagina.  The anterior lip of the cervix was grasped with a single tooth tenaculum.  The cervix was sewn in with Shawnie Pons dilators to a #21.  The hysteroscope was easily introduced into the endometrial cavity.  Pictures were obtained.  No significant polyps or abnormal tissue was seen.  The hysteroscope was removed.  The cervix needed to be dilated slightly more to about a #23 to allow for a D&C.  Sharp curettage was performed  with only scant tissue present.  The hysteroscope was reintroduced and no other abnormality was seen.  Adequate hemostasis was present.  All instruments were removed from the vagina.                                The patient was taken out of the dorsolithotomy position.  She was awakened and she was extubated.  She was moved from the operating table to a stretcher in stable condition.  Complications were none. Estimated blood loss less than 5 cc. DD:  04/01/00 TD:  04/02/00 Job: [id number]

## 2010-12-13 NOTE — Discharge Summary (Signed)
NAME:  Jasmine Bridges, Jasmine Bridges                            ACCOUNT NO.:  [id number]   MEDICAL RECORD NO.:  [id number]                   PATIENT TYPE:  INP   LOCATION:  5037                                 FACILITY:  [facility]   PHYSICIAN:  Rodney A. Chaney Malling, M.D.           DATE OF BIRTH:  Mar 20, 1933   DATE OF ADMISSION:  06/13/2003  DATE OF DISCHARGE:  06/16/2003                                 DISCHARGE SUMMARY   ADMISSION DIAGNOSES:  1. Osteoarthritis of right hip.  2. Status post left total hip arthroplasty July 2004.  3. History of anemia.  4. Osteoporosis.  5. Hypertension.   DISCHARGE DIAGNOSES:  1. Status post right total hip arthroplasty.  2. Left total hip arthroplasty July 2004.  3. Osteoporosis.  4. Anemia.  5. Hypertension.   HISTORY OF PRESENT ILLNESS:  Jasmine Bridges is a 77 year old African-American  female with an 8- to 47-month history of right hip pain.  She has pain when  ambulating or rising from a chair or going from a standing to a lying-down  position.  The pain radiates into her buttocks and down her right leg.  Mechanical symptoms including giving away, painful popping sensation.  The  patient underwent a total hip arthroplasty in July 2004, which is doing  well.  The patient uses a walker to ambulate.  She walks with a limp on the  right side and has pain that wakes her at night.  X-rays of the right hip  showed complete loss of all articular cartilage with  a proximal migration  of the hip.  The patient was admitted to [facility] on June 13, 2003, and underwent a right hip replacement.   ALLERGIES:  No known drug allergies.   MEDICATIONS:  1. Fosamax 70 mg once each week.  2. Demadex 20 mg, 1 tablet daily.  3. Klor-Con 10 mEq, 1 tablet daily.  4. Neurontin 300 mg, 1 q.8h.  5. Darvocet-N 100, 1 tablet q.4-6h.  6. Aleve 220 mg, 1 q.8h.  7. Calcium + D 1800 mg daily.  8. Vitamin E 400 international units, 1 daily.   SURGICAL PROCEDURE:  The patient  was taken to the operating room on June 13, 2003, by Dr. Rinaldo Ratel, assisted by Dr. Norlene Campbell and  Arlyn Leak, P.A.C.  The patient was placed under general anesthesia, and a  right total hip replacement was performed.  The patient tolerated the  procedure well and returned to recovery in good and stable condition.   CONSULTATIONS:  The following consults were obtained while the patient was  hospitalized:  PT, OT, rehabilitation, case management.   HOSPITAL COURSE:  The patient developed postoperative anemia secondary to  surgery.  The patient remained asymptomatic throughout hospital stay, and  vital signs remained stable.  The patient remained afebrile throughout the  hospital stay.  Due to the patient's acute blood loss anemia with her  H&H on  the day of discharge at 8.6/25.2.  Stool was ordered on the day of discharge  to rehabilitation.  The patient's H&H and the results of the guaiac test  will be followed up on by rehabilitation.  The patient was in good and  stable condition on discharge to rehabilitation.   LABORATORIES:  CBC on admission revealed white blood count 7.3, hemoglobin  13.1, hematocrit 38.5, platelets 263.  Coagulations:  PT 13.2, INR 1, PTT  38.  Routine chemistries:  Sodium of 139, potassium 4.6, chloride 109,  bicarbonate 27, glucose 95, BUN 22, creatinine 1.1.   Hepatic enzymes on admission:  AST 20, ALT 17, ALP 69, total bilirubin 0.5.   EKG dated February 08, 2003, showed normal sinus rhythm with a heart rate of 61  beats per minute.  PR interval 150 msec.  PRT axis 61, negative 26, negative  12.   X-rays of right hip status post total hip arthroplasty dated June 13, 2003, showed right hip prosthesis in appropriate position.  No evidence of  fracture or dislocation.   DISCHARGE INSTRUCTIONS:   DISCHARGE MEDICATIONS:  1. Trinsicon 1 capsule p.o. three times daily.  2. Lovenox 30 mg subcutaneous q.12h.  3. Demadex 20 mg p.o. daily.   4. Spironolactone 25 mg p.o. daily.  5. Potassium chloride 10 mEq p.o. daily.  6. Neurontin 300 mg p.o. q.8h.  7. Calcium carbonate 2 tablets p.o. daily.  8. Colace 100 mg p.o. b.i.d.  9. Restoril 15 mg p.o. q.h.s.  10.      Oxycodone 10 mg, 1 sustained-release p.o. b.i.d.  11.      Percocet 5 mg, 1-2 tablets p.o. q.4-6h. p.r.n. breakthrough pain.  12.      Acetaminophen 325-650 mg p.o. p.r.n. temperature greater than     101.5.  13.      Robaxin 500 mg p.o. q.6h. p.r.n. muscle spasm.  14.      Phenergan 25 mg, 1 tablet p.o. q.6h. p.r.n. nausea.   ACTIVITY:  Weightbearing as tolerated.   DIET:  Normal diet, advance as tolerated.   CONDITION ON DISCHARGE:  The patient was discharged to rehabilitation in  good and stable condition.   FOLLOW-UP:  The patient will need a follow-up with Dr. Chaney Malling in the  office in 12-14 days from surgery for staple removal and follow-up exam.  The patient is to call the office at [phone] for appointment.   WOUND CARE:  Daily dressing changes.  May shower after two days of no  drainage from the wound site.  The patient is to call the office if she  develops any of the following:  Temperature greater than 100.5, chills,  swelling, foul-smelling drainage from the wound site, or pain not controlled  by pain medication.      Richardean Canal, P.A.                       Rodney A. Chaney Malling, M.D.    GC/MEDQ  D:  06/16/2003  T:  06/17/2003  Job:  [id number]

## 2010-12-13 NOTE — Procedures (Signed)
[name]. [facility]  Patient:    Jasmine Bridges, Jasmine Bridges Visit Number: [id number] MRN: [id number]          Service Type: SUR Location: 3000 3027 01 Attending Physician:  Emeterio Reeve Dictated by:   Payton Doughty, M.D. Proc. Date: 12/28/01 Admit Date:  12/28/2001                             Procedure Report  PREOPERATIVE DIAGNOSIS:  Severe spondylosis, L4-5, L5-S1.  POSTOPERATIVE DIAGNOSIS:  Arthritic spondylosis at L4-5, L5-S1.  PROCEDURES:  L4-5, L5-S1 laminectomy, diskectomy, posterior lumbar interbody fusion with Ray Threaded Fusion Cages, posterolateral arthrodesis with 90D pedicle screws.  SURGEON:  Payton Doughty, M.D.  NURSE ASSISTANT:  [facility].  DOCTOR ASSISTANT: Kyle L. Franky Macho, M.D.  ANESTHESIA:  General endotracheal.  PREPARATION:  Sterile Betadine prep and scrub with alcohol wipe.  DESCRIPTION OF PROCEDURE:  This is a 77 year old right-handed black lady with severe lumbar spondylosis and difficulty walking.  She was taken to the operating room and smoothly anesthetized and intubated, placed prone on the operating table.  Pressure points were padded.  Following shave, prep, and drape in the usual sterile fashion, the skin was infiltrated with 1% lidocaine and 1:400,000 epinephrine.  Her old skin incision from her operation at L5-S1 was reopened and extended approximately 4 cm so that the incision extended from mid-L3 to mid-S1.  The laminae of L3, L4, L5, and S1 were exposed bilaterally in the subperiosteal plane, and the transverse process at 4-5 and the sacral ala were exposed bilaterally.  Intraoperative x-ray confirmed correctness of the level.  The pars interarticularis, remaining lamina, and inferior facet of L4 and L5 and the superior facet of L5 and S1 were removed bilaterally.  Careful dissection at each level dissected free the L4, L5, and S1 nerve roots as they rounded their respective pedicles.  Interestingly, at L4-5 on the  right side there was a large whitish sac of material contiguous with the disk space that extended into the epidural space and in the axilla of the right L4 root.  This was removed and resulted in immediate decompression of the L4 root.  Diskectomy was then carried out at each level.  Ray Threaded Fusion Cages were placed, 12 x 21 mm, by carefully retracting the nerve roots and thecal sac.  Intraoperative x-ray showed good placement of the cages. They were packed with bone graft harvested from the facet joints.  Pedicle screws were then placed at L4, L5, and S1 using standard landmarks. Intraoperative x-ray showed good placement of the screws.  They were linked with 70 mm rods and capped.  The caps were tightened, and bone was placed in the transverse processes at L4 and L5 after decorticating the bone with a high-speed drill.  Intraoperative x-ray showed good placement of pedicle screws, rods, and Ray cages.  The fascia was reapproximated with 0 Vicryl in interrupted fashion, subcutaneous tissue was reapproximated with 0 Vicryl in interrupted fashion.  Subcuticular tissue was reapproximated with 3-0 Vicryl in interrupted fashion.  The skin was closed with 3-0 nylon in a running locked fashion.  A Bacitracin-Telfa dressing was applied and made occlusive with OpSite.  The patient then returned to the recovery room in good condition. Dictated by:   Payton Doughty, M.D. Attending Physician:  Emeterio Reeve DD:  12/28/01 TD:  12/30/01 Job: [id number]

## 2010-12-13 NOTE — Discharge Summary (Signed)
[place]. [facility]  Patient:    Jasmine Bridges, Jasmine Bridges Visit Number: [id number] MRN: [id number]          Service Type: SUR Location: 3000 3027 01 Attending Physician:  Emeterio Reeve Dictated by:   Payton Doughty, M.D. Admit Date:  12/28/2001 Discharge Date: 01/03/2002                             Discharge Summary  ADMITTING DIAGNOSIS: Spondylosis at L4-5 and L5-S1.  DISCHARGE DIAGNOSIS: Same.  OPERATIVE PROCEDURE: L4-5 and L5-S1 laminectomy, diskectomy and posterior lumbar body interfusion with right sided fusion cage and posterolateral arthrodesis with 90D system.  COMPLICATIONS: None.  DISCHARGE STATUS:  Well.  BODY OF TEXT: The patient is a 77 year old right handed black lady whose history and physical is recounted on the chart.  She  has had increasing difficulty with gait related to spondylitic change and was thought to have a synovial cyst at 4-5 and spondylosis at 5-1. She was admitted for decompression and fusion.  PAST MEDICAL HISTORY:  Hypertension.  MEDICATIONS: 1. Maxzide. 2. Alprazolam. 3. Prempro. 4. Vioxx. 5. Vitamin E. 6. Tylenol.  ALLERGIES:  No allergies.  GENERAL EXAMINATION:  Unremarkable.  NEUROLOGIC EXAMINATION: Diminished ambulation with inability to extend her back. She had positive straight leg raise bilaterally.  She could be motioned to full strength.  HOSPITAL COURSE: The patient was taken to the operating room after ascertaining normal laboratory values.  She underwent a L4-5 and L5-S1 laminectomy, diskectomy and posterior lumbar body interfusion. Intraoperatively, it was found that she had a large arthritic exudate associated with the right 5-1 joint that was significantly compressing the right L4-5 neural foraminal area.  Once this was removed, the nerve root was greatly decompressed. Fusion was uneventful. Postoperatively she has done extremely well.  Her gait is improved. She had some difficulty getting in  and out of bed because of back pain.  She did well in physical therapy.  She is being sent home with a home health aide because she is by herself.  She is also going to have a hospital bed to facilitate getting in and out of bed. Strength is full.  Incisions are dry.  She is going home on Percocet for pain.  FOLLOW-UP: Her follow-up will be in the [facility] office on Monday for suture removal. Dictated by:   Payton Doughty, M.D. Attending Physician:  Emeterio Reeve DD:  01/03/02 TD:  01/04/02 Job: [id number]

## 2010-12-13 NOTE — Op Note (Signed)
NAME:  Jasmine Bridges, Jasmine Bridges                            ACCOUNT NO.:  [id number]   MEDICAL RECORD NO.:  [id number]                   PATIENT TYPE:  INP   LOCATION:  2550                                 FACILITY:  [facility]   PHYSICIAN:  Rodney A. Chaney Malling, M.D.           DATE OF BIRTH:  [birth year]   DATE OF PROCEDURE:  06/13/2003  DATE OF DISCHARGE:                                 OPERATIVE REPORT   PREOPERATIVE DIAGNOSES:  Osteoarthritis, right hip.   POSTOPERATIVE DIAGNOSES:  Osteoarthritis, right hip.   OPERATION PERFORMED:  Total knee replacement on the right using a 100 series  Pinnacle acetabular press-fit cup with Apex hole eliminator and a 32 mm  acetabular marathon liner and a 15 small femoral component with a +5 32 mm  head.   SURGEON:  Lenard Galloway. Chaney Malling, M.D.   ASSISTANT:  1. Claude Manges. Cleophas Dunker, M.D.  2. Jamelle Rushing, P.A.   ANESTHESIA:  General.   DESCRIPTION OF PROCEDURE:  The patient was placed on the operating table in  the supine position.  After satisfactory oral endotracheal anesthesia, the  patient was placed in full lateral position with the right hip up.  The  right hip and thigh was prepped with DuraPrep and draped out in the usual  manner.  A posterior Southern incision was made over the posterior aspect of  the hip.  Skin edges were retracted.  Bleeders were coagulated.  The tensor  fascia lata was split.  Piriformis was released and tagged and brought to  the midline.  The soft tissue was stripped off the posterior capsule.  The  posterior capsule was then incised from superior to inferior and the femoral  head was exposed.  The head was then dislocated.  Excellent exposure to the  head and neck was achieved.  Using a power saw, the head was cut at the  appropriate length and angle and the head was removed.  This gave excellent  access to the hip.  The neck was exposed and a canal finder was passed down  the femoral canal.  The femoral canal was reamed out  with a series of fully  fluted reamers up the appropriate size.  The broach was then passed down the  femoral canal.   Cancel this.  I am going to redictate.                                               Rodney A. Chaney Malling, M.D.    RAM/MEDQ  D:  06/13/2003  T:  06/13/2003  Job:  [id number]

## 2010-12-13 NOTE — H&P (Signed)
NAME:  Jasmine Bridges, Jasmine Bridges                            ACCOUNT NO.:  [id number]   MEDICAL RECORD NO.:  [id number]                   PATIENT TYPE:  INP   LOCATION:  NA                                   FACILITY:  [facility]   PHYSICIAN:  Rodney A. Chaney Malling, M.D.           DATE OF BIRTH:  11-27-1932   DATE OF ADMISSION:  DATE OF DISCHARGE:                                HISTORY & PHYSICAL   CHIEF COMPLAINT:  Painful right hip.   HISTORY OF PRESENT ILLNESS:  Ms. Hendrie is a 77 year old African-American  female with an 8-9 month history of right hip pain.  She has pain with  ambulation, arising from a chair or going from a standing to a lying down  position.  The pain radiates into her buttock and down her right leg.  Mechanical symptoms include a giving way sensation and a painful popping  sensation.   The patient underwent a left total hip replacement in July 2004 and her left  hip, at the present time, is doing very well.  She is using a walker to  ambulate due to the right hip pain and she walks with a limp and has pain  that wakes her at night.  She takes Darvocet, Neurontin and Aleve for pain.  X-rays of her right hip showed complete loss of all articular cartilage with  proximal migration of the right hip.  Therefore, the patient is scheduled to  undergo a right hip replacement to be done at [facility] on  June 13, 2003.   ALLERGIES:  No known drug allergies.   MEDICATIONS:  1. Fosamax 70 mg 1 every week.  2. Demodex 20 mg 1 tab daily.  3. Aldactone 25 mg 1 tab daily.  4. Klor-Con 10 mEq 1 tab daily.  5. Neurontin 300 mg 1 q.8h.  6. Darvocet-N 100, one tab q.4-6h.  7. Aleve 220 mg 1 q.8h.  8. Calcium with D 1800 mg daily.  9. Vitamin E 400 International Units daily.   PAST MEDICAL HISTORY:  1. Past medical history is positive for hypertension.  2. Diverticulosis, recently diagnosed.  3. Arthritis.  4. Osteoporosis.  5. History of postoperative anemia.   PAST  SURGICAL HISTORY:  1. Back surgery for herniated disk in 1979.  2. A back surgery for herniated disk in 1987.  3. Back surgery, fusion removal of a cyst and spur in 2000.  4. Left hip surgery in July 2004 by Dr. Chaney Malling.  The patient states that     she had no complications and received a blood transfusion with the last     hip surgery.   SOCIAL HISTORY:  Denies any tobacco or alcohol use.  She is widowed and has  3 adult children.  She is seen by Dr. Loleta Chance (phone number [phone]).  She  lives in a 1-story home with 2 steps to the usual entrance.  She  is retired.   FAMILY HISTORY:  Mother is deceased at age 24 due to an aneurysm. Father  deceased at age 62 with an MI.  History of a blood clot.  She has 1 older  brother age 64 with history of multiple back surgeries and now recently  diagnosed with Joseph's disease.   REVIEW OF SYSTEMS:  Positive for glasses.  Positive for dyspnea with  exertion.  Denies any PND or orthopnea.  She has occasional bouts of  constipation.  Nocturia x1.  She reports a history of tachycardia  approximately 7-8 years ago and underwent a stress test and echocardiogram  that she reports as negative.  She also reports that she feels really tired  with no energy, mostly due to deconditioning and the pain that she is  experiencing in her right hip.  She states that she has a good appetite.  Review of systems otherwise is negative.  The patient has had a weight loss  over the last 1-1/2 years from 185 pounds to 168 pounds.  She feels that  this is due to being on the 2 diuretics.  She was sent for CT scan which  found a 10 mm nodule on her right breast and also diagnosed her with  scattered descending colonic diverticula.   PHYSICAL EXAMINATION:  GENERAL:  A well-developed, well-nourished, female  who walks with an antalgic gait.  She uses a walker and moves very slowly in  the exam room.  MENTAL STATUS:  The patient's mood and affect are appropriate although  she  does seem rather tired.  The patient talks easily with the examiner.  VITAL SIGNS:  The patient's weight is 168 pounds.  Approximate height is 5  feet 4-1/4 inches.  Blood pressure is 110/70; pulse 72; respiratory rate is  16; temperature is 96.8.  HEART:  Regular rate and rhythm.  No other additional heart sounds are  noted.  LUNGS:  Lungs are clear to auscultation bilaterally without wheezing or  rhonchi.  ABDOMEN:  Soft, nontender.  Positive bowel sounds in 4 quadrants.  NECK:  Trachea is midline with no lymphadenopathy.  Carotids are 2+ without  bruits.  No tenderness along the cervical spine.  She has full range of  motion of the cervical spine.  HEENT:  Normocephalic, atraumatic without frontal or maxillary sinus  tenderness to palpation.  Sclerae are nonicteric bilaterally.  PERRLA.  Conjunctivae are pink.  EOMs are intact.  There are no visible external  deformities of the ears.  TMs are pearly and gray bilaterally.  Nose and  nasal septum are midline.  Nasal mucosa is pink and moist without polyps.  Buccal mucosa is pink and moist.  Pharynx without erythema or exudate.  Tongue and uvula are midline.  BREASTS, GENITOURINARY, AND RECTAL:  Exams are all deferred at this time.  BACK:  Thoracic and lumbar spine are not tender to palpation.  NEUROLOGIC:  The patient is alert and oriented x3.  Cranial nerves II-XII  are grossly intact.  Strength testing of the upper and upper and lower  extremities reveals strength to be 5/5 against resistance.  Deep tendon  reflexes are 2+ bilaterally throughout the upper and lower extremities.  MUSCULOSKELETAL:  Upper extremities full range of motion.  Otherwise the  upper extremities neuro, motor, and vascular are intact.  Radial pulses are  2+ bilaterally and are equal and symmetric.  EXTREMITIES:  Left hip range of motion is excellent.  Flexion of the hip  causes pain.  Right hip has very limited internal and external rotation of the hip  causes pain in the right knee.  Bilateral knees positive for  crepitus.  She has pain in the medial and lateral compartments of both  knees.  She has full extension of both knees and flexes both knees to  approximately 120 degrees. Varus stressing revealed no instability.  Lower  extremities are not edematous.  Dorsal pedal pulses are 2+ bilaterally.   IMPRESSION:  1. Osteoarthritis of the right hip.  2. Status post left total hip replacement in July 2004.  3. Recently diagnosed diverticulosis.  4. Recently diagnosed 10 mm nodule right breast.  5. Arthritis.  6. History of anemia.  7. Osteoporosis.   PLAN:  The patient will be admitted to [facility] on June 13, 2003 and undergo right total hip replacement.  Preoperatively we will try to  have the patient seen by her primary care doctor, Dr. Loleta Chance, to follow up on  her diverticulosis and the right breast nodule.  I will contact Dr. Adaline Sill  office and see if any steps are needed prior to surgery, or if this can be  taken care of after surgery.      Richardean Canal, P.A.                       Rodney A. Chaney Malling, M.D.    GC/MEDQ  D:  06/07/2003  T:  06/07/2003  Job:  [id number]

## 2010-12-13 NOTE — Op Note (Signed)
NAME:  Jasmine Bridges, BREIT                            ACCOUNT NO.:  [id number]   MEDICAL RECORD NO.:  [id number]                   PATIENT TYPE:  INP   LOCATION:  2874                                 FACILITY:  [facility]   PHYSICIAN:  Thereasa Distance A. Chaney Malling, M.D.           DATE OF BIRTH:  1932-12-02   DATE OF PROCEDURE:  02/14/2003  DATE OF DISCHARGE:                                 OPERATIVE REPORT   PREOPERATIVE DIAGNOSIS:  Severe osteoarthritis of the left hip.   POSTOPERATIVE DIAGNOSIS:  Severe osteoarthritis of the left hip.   OPERATION PERFORMED:  Left total hip replacement using an AML small stature,  pore coated femoral component with a 100 Pinnacle acetabular cup 52 mm in  outside diameter with Apex hole eliminator and a Pinnacle Marathon  acetabular liner 52 mm outside diameter and a 32 mm femoral head with a +1  neck.   SURGEON:  Lenard Galloway. Chaney Malling, M.D.   ASSISTANT:  1. Claude Manges. Cleophas Dunker, M.D.  2. Jamelle Rushing, P.A.   ANESTHESIA:  General.   DESCRIPTION OF PROCEDURE:  After satisfactory oral endotracheal anesthesia,  the patient was placed on the table with the left hip up.  The left hip was  then prepped with DuraPrep and draped out in the usual manner.  A posterior  Southern incision was made.  Skin edges were retracted.  Bleeders were  coagulated.  The tensor fascia lata was split and the posterior external hip  rotators were identified and isolated.  A tag was placed through the  piriformis and the piriformis was released.  Incision made through the  posterior hip capsule and the hip dislocated.  Excellent exposure was  achieved.  Self-retaining retractor was put in place.  Bleeders were  coagulated.  Using a power saw, the neck was amputated at the appropriate  level.  A canal finder was passed down the femoral canal and a series of  fully fluted reamers was passed down the femoral canal up to an 11.5  diameter.  Marney Doctor was then placed on the femoral canal and a 12  mm small  stature broach fitted very nicely.  The calcar was then reamed with a calcar  reamer. Excellent fit of the femoral component was achieved.  The femoral  component was then removed.   Attention was turned to the acetabulum.  A series of cheese cutter reamers  was then placed in the acetabulum.  This was reamed up to a 51 mm outside  diameter. Trials were tested and a 52 mm seated proud.  A 52 mm pore coated  100 Pinnacle cup was then impacted with excellent scratch fit.  This seated  out very nicely at the bottom.  A trial acetabular liner was inserted.  Femoral broach was passed down the femoral canal and a +1 neck length was  placed on the femoral head.  The hip was then reduced and  put through a full  range of motion.  Excellent stability was achieved in all positions.  There  was full extension, full flexion.  There was some leg length that was gained  with this procedure because the acetabulum had progressed proximally due to  wear.  The hip was irrigated with copious amounts of antibiotic solution.  All the trial components were then removed.  The Apex hole eliminator  inserted.  Final poly was snap fitted in place with excellent capture.  The  AML pore coated stem was then passed down the femoral canal and seated very  nicely on the calcar.  The final 32 mm head was placed over the __________  and tapped in position.  It was a good cold weld.  The hip was then reduced  and once again put through a full range of motion.  Excellent stability in  all positions was achieved.  The posterior hip capsule was then closed.  The  piriformis was reattached posteriorly.  The tensor fascia lata was closed  with Ethibond. Subcutaneous tissue closed with Vicryl and skin closed with  stainless steel staples.   DRAINS:  None.   COMPLICATIONS:  None.   Technically, this procedure went extremely well.                                               Rodney A. Chaney Malling, M.D.     RAM/MEDQ  D:  02/14/2003  T:  02/14/2003  Job:  [id number]

## 2010-12-13 NOTE — Discharge Summary (Signed)
NAME:  Jasmine Bridges, Jasmine Bridges                            ACCOUNT NO.:  [id number]   MEDICAL RECORD NO.:  [id number]                   PATIENT TYPE:  INP   LOCATION:  4148                                 FACILITY:  [facility]   PHYSICIAN:  Mariam Dollar, P.A.               DATE OF BIRTH:  April 22, 1933   DATE OF ADMISSION:  06/16/2003  DATE OF DISCHARGE:                                 DISCHARGE SUMMARY   DISCHARGE DIAGNOSES:  1. Right total hip arthroplasty secondary to osteoarthritis November 16.  2. Pain management.  3. Anemia.  4. Subcutaneous Lovenox for deep vein thrombosis prophylaxis.  5. Hypertension.  6. History of left total hip arthroplasty July, 2004.   HISTORY OF PRESENT ILLNESS:  The patient is a 77 year old black female with  a history of left total-hip arthroplasty July, 2004, with inpatient  rehabilitation services, now admitted June 13, 2003, with advanced  osteoarthritis of the right hip and no relief with conservative care.  She  underwent right total hip arthroplasty on November 16, per Dr. Thereasa Distance A.  Mortenson.  Placed on subcutaneous Lovenox for deep vein thrombosis  prophylaxis, 50% partial weightbearing, postoperative pain management,  anemia of 8.6.  No chest pain or shortness of breath.  Moderate assistance  for ambulation.   The patient was admitted for comprehensive rehabilitation program.   PAST MEDICAL HISTORY:  1. Hypertension.  2. Gout.   PAST SURGICAL HISTORY:  1. Left total hip arthroplasty, July 2004.  2. Back surgery.  3. Dilatation and curettage.   ALLERGIES:  None.   SOCIAL HISTORY:  Denies alcohol or tobacco.   PRIMARY CARE PHYSICIAN:  Dr. Mirna Mires, [phone].   MEDICATIONS PRIOR TO ADMISSION:  1. Fosamax.  2. Demadex.  3. Aldactone.  4. Klor-Con.  5. Neurontin.  6. Darvocet.   SOCIAL HISTORY:  Lives alone in [place], independent with a walker prior  to admission.  One level home, two steps to entry.  Daughter in [place].   HOSPITAL COURSE:  The patient did well on Rehabilitation Services with  therapies initiated on a b.i.d. basis.  The following issues are followed in  the patient rehabilitation course.   Pertaining to the patient's right total hip arthroplasty, surgical site is  healing nicely, no signs of infection.  She was ambulating with a walker,  partial weightbearing, hip precautions noted.  Surgical site was healing  nicely with plans to follow up with Dr. Lenard Galloway. [place], [phone].  Pain  management control with the use of Oxycodone as needed, as well as Neurontin  of which he was on 300 mg every eight hours prior to admission.  Postoperative anemia with the latest hemoglobin of 9.2.  She will continue  on iron supplements.  She had no orthostatic changes.  No chest pain, no  shortness of breath.  Blood pressures remained well controlled with Demadex  as well as Aldactone.  Overall,  for her functional mobility, she was close  supervision in all active mobility, minimal assist lower body dressing.  Home Health, physical and occupational therapy has been arranged.   DISCHARGE MEDICATIONS:  1. Trinsicon, one capsule twice daily.  2. Demadex 20 mg daily.  3. Aldactone 25 mg daily.  4. Potassium chloride 10 mEq daily.  5. Neurontin 300 mg every eight hours.  6. Oscal 1000 mg daily.  7. Oxycodone as needed for pain.  8. She was on subcutaneous Lovenox for deep vein thrombosis prophylaxis.     This was discontinued at the time of discharge.   DISCHARGE INSTRUCTIONS:  1. Activity was 50% partial weightbearing.  2. Diet was regular.  3. Wound care, cleanse incision daily with warm soap and water.   SPECIAL INSTRUCTIONS:  Home Health and Physical Occupation Therapy.  The  patient should follow up with Dr. Lenard Galloway. Chaney Malling, [phone], medical or  orthopedic services.                                                Mariam Dollar, P.A.    DA/MEDQ  D:  06/21/2003  T:  06/22/2003  Job:   [id number]   cc:   Erick Colace, M.D.  [address] [place]  [place] [address]  Fax: [phone]   Lenard Galloway. Chaney Malling, M.D.  [address] [place]  [place] [address]  Fax: [phone]   Annia Friendly. Loleta Chance, M.D.  [address]  [place]  [place] [address]  Fax: [phone]

## 2010-12-13 NOTE — Discharge Summary (Signed)
NAME:  Jasmine Bridges, Jasmine Bridges                            ACCOUNT NO.:  [id number]   MEDICAL RECORD NO.:  [id number]                   PATIENT TYPE:  IPS   LOCATION:  4144                                 FACILITY:  [facility]   PHYSICIAN:  Ellwood Dense, M.D.                DATE OF BIRTH:  08/31/1932   DATE OF ADMISSION:  02/17/2003  DATE OF DISCHARGE:  03/03/2003                                 DISCHARGE SUMMARY   DISCHARGE DIAGNOSES:  1. Left total hip arthroplasty, July 20, secondary to osteoarthritis.  2. Anemia.  3. Pain management.  4. Subcutaneous Lovenox for deep vein thrombosis prophylaxis.  5. Hypertension.  6. History of gout.  7. Osteoporosis.  8. Constipation.   HISTORY OF PRESENT ILLNESS:  77 year old black female admitted July 20 with  end-stage osteoarthritis of both hips, left greater than right, and no  relief with conservative care. Underwent elective left total hip  arthroplasty July 20 per Dr. Chaney Malling. Placed on subcutaneous Lovenox for  deep vein thrombosis prophylaxis and 50% partial weightbearing.  Postoperative anemia 7.9 and transfused 2 units of packed red blood cells  July 21.  Patient with complaint of left great toe pain with swelling. X-  rays with soft tissue swelling. No acute fractures. Suspect gout. Placed on  Colchicine. Check of uric acid level 8.1.  Chronic pain syndrome, maintained  with OxyContin continued release. Her PCA morphine pump was discontinued on  July 22.  She was minimal assist for ambulation. She was admitted for  comprehensive rehab program.   PAST MEDICAL HISTORY:  See discharge diagnoses.   PAST SURGICAL HISTORY:  Back surgery x2.   ALLERGIES:  None.   SOCIAL HISTORY:  Remote smoker. No alcohol. Lives alone in [place]. Used  a walker prior to admission. One level home, 2 steps to entry. No local  family.   PRIMARY CARE PHYSICIAN:  Annia Friendly. Loleta Chance, M.D., [phone].   MEDICATIONS PRIOR TO ADMISSION:  1. Darvocet as needed.  2. Duragesic patch 25 mcg every 72 hours.  3. Neurontin 300 mg 2 tablets daily.  4. Fosamax weekly.  5. Vioxx 25 mg 2 tablets daily.  6. Aldactone 25 mg daily.  7. Klor-Con 10 mEq daily  8. Os-Cal 3 times daily.  9. Zantac as needed.  10.      Demadex 20 mg daily.   HOSPITAL COURSE:  The patient did well while on rehab services, with therapy  as initiated on a b.i.d. basis. The following issues were following during  patient's rehab course. Pertaining to Jasmine Bridges's left total hip  arthroplasty July 20 secondary to osteoarthritis, surgical site healing  nicely. Staples have been removed. She is 50% partial weightbearing. Would  follow with Dr. Chaney Malling, Orthopedic Services, [phone], as advised. She  remained on subcutaneous Lovenox for deep vein thrombosis prophylaxis. This  was discontinued at time of discharge. Neurovascular sensation remained  intact.  Postoperative anemia with latest hemoglobin 10.8, hematocrit 31.3.  Blood pressures controlled with home regimens of Aldactone and Demadex.  Chronic pain syndrome with the use of Duragesic patch, Neurontin, OxyContin  continued release, and Tylox. It was the patient's request that her Tylox be  discontinued and maintained simply with a Duragesic patch. She remained on  her Fosamax weekly for history of osteoporosis as well as Os-Cal. Her left  great toe gout had resolved. Her Colchicine was discontinued. She had no  other bowel or bladder disturbances.  Overall for her functional mobility,  she was ambulating 80 feet min assist with supervision, min assist for  stairs, modified independence for activities of daily living. Due to the  fact that she lived alone, it was patient's request that skilled nursing  facility be needed for a short time until her weightbearing advanced and she  could return home at that time. Bed became available at extended care  facility, March 03, 2003, discharge taking place at that time without   complications.   LATEST LABS:  Sodium 137, potassium 3.9, BUN 18, creatinine 1.1, hemoglobin  10.8, hematocrit 31.3.   DISCHARGE MEDICATIONS:  1. Neurontin 600 mg daily.  2. OxyContin continued release 10 mg every 12 hours.  3. Potassium chloride 10 mEq daily.  4. Aldactone 25 mg p.o. daily.  5. Demadex 20 mg p.o. daily.  6. Duragesic patch 25 mcg, change every 72 hours.  7. Os-Cal 500 mg three times daily.  8. Fosamax 70 mg p.o. every 7 days.  9. Claritin 10 mg daily.  10.      Tylenol as needed.   ACTIVITY:  Partial weightbearing.   DIET:  Regular.   WOUND CARE:  Clean incision daily with warm soap and water.   SPECIAL INSTRUCTIONS:  Continue therapies to promote overall mobility and  well being.   FOLLOW UP:  The patient should follow up with Dr. Chaney Malling, Orthopedic  Services, [phone]. Call for appointment.      Mariam Dollar, P.A.                     Ellwood Dense, M.D.    DA/MEDQ  D:  03/02/2003  T:  03/02/2003  Job:  [id number]   cc:   Ellwood Dense, M.D.  [address] [place]  [place] [address]  Fax: [phone]   Lenard Galloway. Chaney Malling, M.D.  [address] [place]  [place] [address]  Fax: [phone]   Annia Friendly. Loleta Chance, M.D.  [address]  [place]  [place] [address]  Fax: [phone]

## 2010-12-13 NOTE — Discharge Summary (Signed)
NAME:  Jasmine Bridges, Jasmine Bridges                            ACCOUNT NO.:  [id number]   MEDICAL RECORD NO.:  [id number]                   PATIENT TYPE:  INP   LOCATION:  5035                                 FACILITY:  [facility]   PHYSICIAN:  Rodney A. Chaney Malling, M.D.           DATE OF BIRTH:  Dec 04, 1932   DATE OF ADMISSION:  02/14/2003  DATE OF DISCHARGE:  02/17/2003                                 DISCHARGE SUMMARY   ADMISSION DIAGNOSES:  1. Osteoarthritis bilateral hips.  2. Osteoarthritis bilateral knees.  3. Anemia.  4. Osteoporosis.   DISCHARGE DIAGNOSES:  1. Osteoarthritis bilateral hips, status post left total hip arthroplasty.  2. Acute blood loss anemia secondary to surgery.  3. Gout left great toe.  4. Osteoporosis.  5. Osteoarthritis bilateral knees.   SURGICAL PROCEDURE:  On February 14, 2003, Ms. Franzen underwent a left total hip  arthroplasty by Dr. Lenard Galloway. Mortenson assisted by Claude Manges. [place] and  Jamelle Rushing, P.A.-C.  She had a Pinnacle 100 Series Acetabular Cuff size  52 mm with an [employer].  An AML Small Stature 155 mm length  40 mm Offset size 12 with a Pinnacle Marathon Acetabular Liner, which was  lipped 52 mm outer diameter, 32 mm inner diameter.  ________ femoral heads  32 mm, +1 on a 1214 cone.   COMPLICATIONS:  None.   CONSULTATIONS:  1. Case management consult and physical therapy on February 15, 2003.  2. Rehabilitation Medicine consult February 15, 2003.  3. Occupational therapy consult February 16, 2003.   HISTORY OF PRESENT ILLNESS:  This 77 year old female presented to Dr. Lenard Galloway. Mortenson with a history of bilateral hip pain over the last 4-5 months,  the left is worse than the right.  The hip seems to pop and lock, at times  it also gives way.  Pain is aggravated with walking and rising from a chair  and decreases with some medications.  X-rays have shown end-stage  osteoarthritis in both hips with femoral collapse and that is why she is  presenting for a left hip replacement.   HOSPITAL COURSE:  Ms. Jasmine Bridges tolerated her surgical procedure well and  without immediate postoperative complications.  She was transferred to 5000.  On postoperative day one T-max was 100.8, vitals were stable.  She had some  tenderness in the right calf, but this was present preoperatively and was  monitored.  Hemoglobin was 7.9 with hematocrit 22.8 and she was subsequently  transfused with one unit of autologous blood.  She tolerated that well.  She  also because her hemoglobin was low at 7.9, after her transfusion she was  transfused with two units of packed red blood cells.   On postoperative day #2 she complained of some left great toe pain.  X-rays  showed possible osteomyelitis versus vasculitis.  She had a history of gout  in the past and so  she was started on colchicine.  She was switched to p.o.  pain medications.  Her hemoglobin had improved to 11.6 with hematocrit of  33.6.   On February 17, 2003, toe pain had been relieved.  The pain was well-controlled  with medications.  She was afebrile, vital signs stable.  Uric acid level  was 8.1.  Hemoglobin was 11, hematocrit 31.4.  There was a moderate amount  of serosanguinous drainage from her hip wound and that was monitored.  She  was switched to Vioxx for her gout.  A bed became available in rehab and she  was subsequently transferred to rehab at that time.   DISCHARGE DIET:  She can continue her current hospitalization diet per  rehab.   DISCHARGE MEDICATIONS:  Continue current hospitalization medications with  adjustments to be made per the rehab physicians.   DISCHARGE ACTIVITIES:  She can be out of bed, partial weightbearing 50% on  the left hip with the use of a walker.  Continue physical therapy and  occupational therapy per rehab protocols.   WOUND CARE:  Please clean left hip incision with Betadine daily and apply  dry dressing.  Staples can be removed with steri strips with  Benzoin applied  on postoperative day #14.  If she is still in rehab at that time it can be  done there, otherwise she needs to follow up with Dr. Lenard Galloway. Mortenson at  that time for removal of staples in the office.   FOLLOWUP:  She is to follow up with Dr. Lenard Galloway. Mortenson in our office on  about postoperative day #14 if her staples are still in place, or about a  week or so after discharge from rehab.  She needs to call [phone] for that  appointment.   LABORATORY DATA:  X-ray taken of the left hip on February 14, 2003, showed left  hip replacement in good position with anatomic alignment of the osseous and  prosthetic structures.  No evidence of breakage or loosening.  She had  advanced degenerative changes of the right hip with deformity of the right  femoral head.  X-ray taken of her left great toe on February 16, 2003, showed  soft tissue swelling, but no radiographic changes of osteomyelitis.   On February 15, 2003, hemoglobin 7.9, hematocrit 22.8.  Later in the day  hemoglobin was 8.2, hematocrit 24.2.  On February 16, 2003, hemoglobin 11.6,  hematocrit 33.6 and platelets 129.   On February 14, 2003, BUN 42, creatinine 1.9.  On February 15, 2003, glucose 114,  calcium 8.  On February 16, 2003, calcium 8.2, uric acid 8.1.  All other  laboratory studies were within normal limits.        Legrand Pitts Duffy, P.A.                      Rodney A. Chaney Malling, M.D.    KED/MEDQ  D:  03/07/2003  T:  03/08/2003  Job:  [id number]   cc:   Annia Friendly. Loleta Chance, M.D.  [address]  [place]  [place] [address]  Fax: [phone]

## 2011-07-23 ENCOUNTER — Encounter: Payer: Self-pay | Admitting: Cardiology

## 2013-10-19 ENCOUNTER — Encounter: Payer: Self-pay | Admitting: Podiatry

## 2013-10-19 ENCOUNTER — Ambulatory Visit (INDEPENDENT_AMBULATORY_CARE_PROVIDER_SITE_OTHER): Payer: Commercial Managed Care - HMO | Admitting: Podiatry

## 2013-10-19 VITALS — BP 116/63 | HR 81 | Resp 16

## 2013-10-19 DIAGNOSIS — M2040 Other hammer toe(s) (acquired), unspecified foot: Secondary | ICD-10-CM

## 2013-10-19 DIAGNOSIS — B351 Tinea unguium: Secondary | ICD-10-CM

## 2013-10-19 DIAGNOSIS — L600 Ingrowing nail: Secondary | ICD-10-CM

## 2013-10-19 NOTE — Progress Notes (Signed)
   Subjective:    Patient ID: Jasmine Bridges, female    DOB: 1933-05-26, 80 y.o.   MRN: [id number]  HPI Comments: "I need my toenails"  Patient states that she is unable to clip the toenails. Some are long, thick and discolored. Not painful.     Review of Systems  Constitutional: Positive for activity change and fatigue.  HENT: Positive for ear pain, hearing loss, sinus pressure and tinnitus.   Eyes: Positive for redness and visual disturbance.  Respiratory: Positive for chest tightness and shortness of breath.   Cardiovascular: Positive for palpitations and leg swelling.  Gastrointestinal: Positive for constipation.  Endocrine: Positive for cold intolerance and polyuria.  Musculoskeletal: Positive for arthralgias, back pain, gait problem and myalgias.  Skin: Positive for color change and rash.  Neurological: Positive for dizziness and weakness.  Psychiatric/Behavioral: The patient is nervous/anxious.   All other systems reviewed and are negative.       Objective:   Physical Exam        Assessment & Plan:

## 2013-10-20 NOTE — Progress Notes (Signed)
Subjective:     Patient ID: Jasmine Bridges, female   DOB: 1932-11-27, 80 y.o.   MRN: [id number]  HPI patient presents stating she is unable to cut her toenails as they are long thick and are painful and he do grow in an abnormal fashion   Review of Systems  All other systems reviewed and are negative.       Objective:   Physical Exam  Nursing note and vitals reviewed. Constitutional: She is oriented to person, place, and time.  Cardiovascular: Intact distal pulses.   Musculoskeletal: Normal range of motion.  Neurological: She is oriented to person, place, and time.  Skin: Skin is dry.   neurovascular status is diminished but intact both feet with digits well perfused and normal range of motion of the subtalar midtarsal joint. I noted there to be thickness of the underlying nailbeds 1-5 both feet that are painful when pressed with an inability to wear shoe with any degree of comfort     Assessment:     Chronic mycotic nail infections with discomfort 1-5 both feet    Plan:     Debridement painful nailbeds 1-5 both feet with no iatrogenic bleeding noted

## 2013-12-27 ENCOUNTER — Encounter: Payer: Self-pay | Admitting: *Deleted

## 2013-12-27 DIAGNOSIS — E119 Type 2 diabetes mellitus without complications: Secondary | ICD-10-CM

## 2013-12-27 DIAGNOSIS — R0602 Shortness of breath: Secondary | ICD-10-CM | POA: Insufficient documentation

## 2013-12-27 DIAGNOSIS — Q794 Prune belly syndrome: Secondary | ICD-10-CM | POA: Insufficient documentation

## 2013-12-27 DIAGNOSIS — N289 Disorder of kidney and ureter, unspecified: Secondary | ICD-10-CM | POA: Insufficient documentation

## 2013-12-27 DIAGNOSIS — M549 Dorsalgia, unspecified: Secondary | ICD-10-CM | POA: Insufficient documentation

## 2013-12-27 DIAGNOSIS — F411 Generalized anxiety disorder: Secondary | ICD-10-CM | POA: Insufficient documentation

## 2013-12-27 DIAGNOSIS — M7150 Other bursitis, not elsewhere classified, unspecified site: Secondary | ICD-10-CM | POA: Insufficient documentation

## 2013-12-27 DIAGNOSIS — N189 Chronic kidney disease, unspecified: Secondary | ICD-10-CM

## 2013-12-27 DIAGNOSIS — E559 Vitamin D deficiency, unspecified: Secondary | ICD-10-CM | POA: Insufficient documentation

## 2013-12-27 DIAGNOSIS — K219 Gastro-esophageal reflux disease without esophagitis: Secondary | ICD-10-CM | POA: Insufficient documentation

## 2013-12-27 DIAGNOSIS — E785 Hyperlipidemia, unspecified: Secondary | ICD-10-CM | POA: Insufficient documentation

## 2013-12-27 DIAGNOSIS — R609 Edema, unspecified: Secondary | ICD-10-CM | POA: Insufficient documentation

## 2013-12-27 DIAGNOSIS — M79609 Pain in unspecified limb: Secondary | ICD-10-CM | POA: Insufficient documentation

## 2013-12-27 DIAGNOSIS — R21 Rash and other nonspecific skin eruption: Secondary | ICD-10-CM

## 2013-12-27 DIAGNOSIS — M810 Age-related osteoporosis without current pathological fracture: Secondary | ICD-10-CM

## 2013-12-27 DIAGNOSIS — R252 Cramp and spasm: Secondary | ICD-10-CM

## 2014-01-04 ENCOUNTER — Ambulatory Visit (INDEPENDENT_AMBULATORY_CARE_PROVIDER_SITE_OTHER): Payer: Commercial Managed Care - HMO | Admitting: Obstetrics and Gynecology

## 2014-01-04 ENCOUNTER — Telehealth: Payer: Self-pay | Admitting: *Deleted

## 2014-01-04 ENCOUNTER — Encounter: Payer: Self-pay | Admitting: Obstetrics and Gynecology

## 2014-01-04 VITALS — BP 142/80 | Wt 171.0 lb

## 2014-01-04 DIAGNOSIS — Z01419 Encounter for gynecological examination (general) (routine) without abnormal findings: Secondary | ICD-10-CM | POA: Insufficient documentation

## 2014-01-04 DIAGNOSIS — Z124 Encounter for screening for malignant neoplasm of cervix: Secondary | ICD-10-CM

## 2014-01-04 NOTE — Progress Notes (Signed)
This chart was scribed by Ludger Nutting, Medical Scribe, for Dr. Mallory Shirk on 01/04/14 at 2:09 PM. This chart was reviewed by Dr. Mallory Shirk for accuracy.  Assessment:  Annual Gyn Exam   Plan:  1. Return in 1 week to apply steri-strips 2.   Annual mammogram advised Subjective:  Jasmine Bridges is a 80 y.o. female No obstetric history on file. who presents for annual exam. No LMP recorded. The patient has complaints today of itching and irritation to the vulva. She has been applying an unspecified cream as well as Vaseline to the area without relief.   The following portions of the patient's history were reviewed and updated as appropriate: allergies, current medications, past family history, past medical history, past social history, past surgical history and problem list.  Review of Systems Constitutional: negative Gastrointestinal: negative Genitourinary: +itching to the groin  Objective:  There were no vitals taken for this visit.   BMI: There is no height or weight on file to calculate BMI.  General Appearance: Alert, appropriate appearance for age. No acute distress HEENT: Grossly normal Gastrointestinal: Soft, non-tender, no masses or organomegaly Pelvic Exam: Pelvic -  VULVA: atrophic tissues in both inguinal creases and inner skin folds around clitoris and labia minora. Cracking along inguinal creases VAGINA: normal appearing vagina with normal color and discharge, no lesions, atrophic  CERVIX: surgically absent,  UTERUS: surgically absent, vaginal cuff well healed, cystocele present  ADNEXA: normal adnexa in size, nontender and no masses  Rectovaginal: not indicated Lymphatic Exam: Non-palpable nodes in neck, clavicular, axillary, or inguinal regions Skin: no rash or abnormalities Neurologic: Normal gait and speech, no tremor  Psychiatric: Alert and oriented, appropriate affect.  Urinalysis:Not done  Mallory Shirk. MD Pgr [phone] 2:09 PM

## 2014-01-04 NOTE — Telephone Encounter (Signed)
Pt advised that Dr. Glo Herring tried to send in Rx for nystatin cream but her insurance does not want to cover it. Pt was advised that Dr. Glo Herring was going to look and try to find something that her insurance will cover. Pt was also advised that if she hasn't heard anything by Friday to call the office back. Pt verbalized understanding.

## 2014-01-04 NOTE — Patient Instructions (Signed)
Call back with the name of your

## 2014-01-06 ENCOUNTER — Telehealth: Payer: Self-pay | Admitting: Obstetrics and Gynecology

## 2014-01-06 NOTE — Telephone Encounter (Signed)
Spoke with pt. She has been on Nystatin cream in the past, but insurance don't want to cover this. Pt is using a cream that she got from the dollar store. It's an anti-itch cream that is like generic Benadryl cream. Pt uses the cream in the crease of her belly and on the outer lip of her vagina, back to her rectum. Pt states the cream is helping. I spoke with Dr. Glo Herring. He advised if it's helping, it's no harm in using it. Pt also mentioned bumps under her right arm, but states it's not bothering her. I advised it would be good to have the Dr take a look at it. Also, in the last note, the Dr wanted her to return in 1 week. Pt voiced understanding.  Call transferred to front desk for appt. [place]

## 2014-01-11 ENCOUNTER — Ambulatory Visit (INDEPENDENT_AMBULATORY_CARE_PROVIDER_SITE_OTHER): Payer: Commercial Managed Care - HMO | Admitting: Obstetrics and Gynecology

## 2014-01-11 ENCOUNTER — Encounter: Payer: Self-pay | Admitting: Obstetrics and Gynecology

## 2014-01-11 VITALS — BP 130/86 | Ht 63.0 in | Wt 171.0 lb

## 2014-01-11 DIAGNOSIS — R21 Rash and other nonspecific skin eruption: Secondary | ICD-10-CM

## 2014-01-11 MED ORDER — NYSTATIN 100000 UNIT/GM EX CREA: 1.0000 "application " | TOPICAL_CREAM | Freq: Two times a day (BID) | CUTANEOUS | Status: DC

## 2014-01-11 MED ORDER — TRIAMCINOLONE ACETONIDE 0.025 % EX OINT: 21.0000 "application " | TOPICAL_OINTMENT | Freq: Two times a day (BID) | CUTANEOUS | Status: DC

## 2014-01-11 NOTE — Progress Notes (Signed)
This chart was scribed by Ludger Nutting, Medical Scribe, for Dr. Mallory Shirk on 01/11/14 at 11:17 AM. This chart was reviewed by Dr. Mallory Shirk for accuracy.   [facility] Visit  Patient name: Jasmine Bridges MRN [id number]  Date of birth: Nov 17, 1932  CC & HPI:  Jasmine Bridges is a 80 y.o. female presenting today for follow up today. Patient was seen on 6/10 for a routine gyn visit and needs Rx for nystatin and 1 % HC as sseparate Rx's as medicare will not cover Mycolog. For inguinal skin breakdown.  ROS:  Pt has concern over tiny recurrent knot of rt axilla.  Pertinent History Reviewed:   Reviewed: Significant for  Medical            Surgical Hx:    Medications: Reviewed & Updated - see associated section Social History: Reviewed -  reports that she has quit smoking. She has never used smokeless tobacco.  Objective Findings:  Vitals: Blood pressure 130/86, height 5\' 3"  (1.6 m), weight 171 lb (77.565 kg).  Physical Examination: General appearance - alert, well appearing, and in no distress Abdomen: steri-strips applied to chronic skin breakdown on abdominal skin crease on right side, near iliac crest. Pelvic - examination not indicated, Axilla:-firm nodule in skin of mid-rt axilla, c/w folliculitis.  Assessment & Plan:   A: 1. Under arm acne stable 2. Skin crease irritation 3.   P:  1. Rx tiamcinolone  2. Rx nystatin 3. Steri-strips applied to chronic breakdown on abdominal skin crease

## 2014-01-11 NOTE — Patient Instructions (Addendum)
Use meds 2x/day to irritated skin

## 2014-05-18 ENCOUNTER — Ambulatory Visit (INDEPENDENT_AMBULATORY_CARE_PROVIDER_SITE_OTHER): Payer: Commercial Managed Care - HMO | Admitting: Otolaryngology

## 2014-05-18 DIAGNOSIS — J310 Chronic rhinitis: Secondary | ICD-10-CM

## 2014-05-18 DIAGNOSIS — R430 Anosmia: Secondary | ICD-10-CM

## 2014-05-18 DIAGNOSIS — H903 Sensorineural hearing loss, bilateral: Secondary | ICD-10-CM

## 2014-05-18 DIAGNOSIS — J342 Deviated nasal septum: Secondary | ICD-10-CM

## 2014-06-15 ENCOUNTER — Ambulatory Visit (INDEPENDENT_AMBULATORY_CARE_PROVIDER_SITE_OTHER): Payer: Commercial Managed Care - HMO | Admitting: Otolaryngology

## 2014-06-15 DIAGNOSIS — J342 Deviated nasal septum: Secondary | ICD-10-CM

## 2014-06-15 DIAGNOSIS — J310 Chronic rhinitis: Secondary | ICD-10-CM

## 2014-06-15 DIAGNOSIS — J343 Hypertrophy of nasal turbinates: Secondary | ICD-10-CM

## 2015-01-09 DIAGNOSIS — N184 Chronic kidney disease, stage 4 (severe): Secondary | ICD-10-CM | POA: Diagnosis not present

## 2015-01-09 DIAGNOSIS — E782 Mixed hyperlipidemia: Secondary | ICD-10-CM | POA: Diagnosis not present

## 2015-01-09 DIAGNOSIS — R5383 Other fatigue: Secondary | ICD-10-CM | POA: Diagnosis not present

## 2015-01-09 DIAGNOSIS — E1122 Type 2 diabetes mellitus with diabetic chronic kidney disease: Secondary | ICD-10-CM | POA: Diagnosis not present

## 2015-01-15 DIAGNOSIS — N182 Chronic kidney disease, stage 2 (mild): Secondary | ICD-10-CM | POA: Diagnosis not present

## 2015-01-15 DIAGNOSIS — G3184 Mild cognitive impairment, so stated: Secondary | ICD-10-CM | POA: Diagnosis not present

## 2015-01-15 DIAGNOSIS — E1142 Type 2 diabetes mellitus with diabetic polyneuropathy: Secondary | ICD-10-CM | POA: Diagnosis not present

## 2015-01-15 DIAGNOSIS — E782 Mixed hyperlipidemia: Secondary | ICD-10-CM | POA: Diagnosis not present

## 2015-01-15 DIAGNOSIS — M545 Low back pain: Secondary | ICD-10-CM | POA: Diagnosis not present

## 2015-01-15 DIAGNOSIS — G589 Mononeuropathy, unspecified: Secondary | ICD-10-CM | POA: Diagnosis not present

## 2015-01-15 DIAGNOSIS — M25559 Pain in unspecified hip: Secondary | ICD-10-CM | POA: Diagnosis not present

## 2015-01-15 DIAGNOSIS — M1000 Idiopathic gout, unspecified site: Secondary | ICD-10-CM | POA: Diagnosis not present

## 2015-01-19 ENCOUNTER — Other Ambulatory Visit: Payer: Self-pay | Admitting: Obstetrics and Gynecology

## 2015-02-13 DIAGNOSIS — M545 Low back pain: Secondary | ICD-10-CM | POA: Diagnosis not present

## 2015-02-13 DIAGNOSIS — G3184 Mild cognitive impairment, so stated: Secondary | ICD-10-CM | POA: Diagnosis not present

## 2015-02-13 DIAGNOSIS — F5101 Primary insomnia: Secondary | ICD-10-CM | POA: Diagnosis not present

## 2015-02-13 DIAGNOSIS — M25559 Pain in unspecified hip: Secondary | ICD-10-CM | POA: Diagnosis not present

## 2015-02-13 DIAGNOSIS — G4700 Insomnia, unspecified: Secondary | ICD-10-CM | POA: Diagnosis not present

## 2015-03-06 ENCOUNTER — Ambulatory Visit (HOSPITAL_COMMUNITY): Payer: Commercial Managed Care - HMO | Attending: Internal Medicine | Admitting: Physical Therapy

## 2015-03-06 DIAGNOSIS — M5416 Radiculopathy, lumbar region: Secondary | ICD-10-CM | POA: Insufficient documentation

## 2015-03-19 ENCOUNTER — Ambulatory Visit (HOSPITAL_COMMUNITY): Payer: Commercial Managed Care - HMO | Admitting: Physical Therapy

## 2015-03-19 DIAGNOSIS — M5416 Radiculopathy, lumbar region: Secondary | ICD-10-CM

## 2015-03-19 NOTE — Patient Instructions (Addendum)
Heel Raise: Bilateral (Standing)   Rise on balls of feet. Repeat ___10_ times per set. Do ____1 sets per session. Do __1__ sessions per day.  http://orth.exer.us/38   Copyright  VHI. All rights reserved.  Functional Quadriceps: Chair Squat  At the kitchen counter Keeping feet flat on floor, shoulder width apart, squat as low as is comfortable. Use support as necessary. Repeat _10___ times per set. Do _1___ sets per session. Do __2__ sessions per day.  http://orth.exer.us/736   Copyright  VHI. All rights reserved.  Balance: Unilateral  Standing at the Kitchen counter with one hand on the counter  Attempt to balance on left leg, eyes open. Hold _10___ seconds. Repeat __5__ times per set. Do _1___ sets per session. Do __2__ sessions per day. Perform exercise on right leg   http://orth.exer.us/28   Copyright  VHI. All rights reserved.  Strengthening: Straight Leg Raise (Phase 1)   Tighten muscles on front of right thigh, then lift leg ___18_ inches from surface, keeping knee locked.  Repeat _10___ times per set. Do ___1_ sets per session. Do ____2 sessions per day.  http://orth.exer.us/614   Copyright  VHI. All rights reserved.  Strengthening: Straight Leg Raise (Phase 1)   Tighten muscles on front of right thigh, then lift leg __18__ inches from surface, keeping knee locked.  Repeat __10__ times per set. Do 1____ sets per session. Do __2__ sessions per day.  http://orth.exer.us/614   Copyright  VHI. All rights reserved.  Bridging   Slowly raise buttocks from floor, keeping stomach tight. Repeat _10___ times per set. Do _1___ sets per session. Do __2__ sessions per day.  http://orth.exer.us/1096   Copyright  VHI. All rights reserved.  Hip Abduction / Adduction: with Extended Knee (Supine)   Bring left leg out to side and return. Keep knee straight. Repeat to the right  Repeat __10__ times per set. Do __1__ sets per session. Do ___2_ sessions per  day.  http://orth.exer.us/680   Copyright  VHI. All rights reserved.  Scapular Retraction (Standing)   With arms at sides, pinch shoulder blades together. Repeat __10__ times per set. Do __1__ sets per session. Do _2___ sessions per day.  http://orth.exer.us/944   Copyright  VHI. All rights reserved.  Isometric Abdominal   Lying on back with knees bent, tighten stomach by pressing elbows down. Hold __3__ seconds. Repeat __10__ times per set. Do ___1_ sets per session. Do __2__ sessions per day.  http://orth.exer.us/1086   Copyright  VHI. All rights reserved.  Backward Bend (Standing)   Arch backward to make hollow of back deeper. Hold ___2_ seconds. Repeat __10__ times per set. Do __1__ sets per session. Do _2___ sessions per day.  http://orth.exer.us/178   Copyright  VHI. All rights reserved.

## 2015-03-19 NOTE — Therapy (Signed)
[place], [place], [address] Phone: [phone]   Fax:  [phone]  Physical Therapy Evaluation  Patient Details  Name: Jasmine Bridges MRN: [id number] Date of Birth: Aug 20, 1932 Referring Provider:  Delphina Cahill, MD  Encounter Date: 03/19/2015      PT End of Session - 03/19/15 1549    Visit Number 1   Number of Visits 1   PT Start Time 1510   PT Stop Time 1600   PT Time Calculation (min) 50 min   Activity Tolerance Patient tolerated treatment well      Past Medical History  Diagnosis Date  . Arthritis   . Diabetes mellitus type II   . GERD (gastroesophageal reflux disease)   . Hyperlipidemia   . Hypertension   . Mitral insufficiency     Past Surgical History  Procedure Laterality Date  . Back surgery  1979, 1987, 2000  . Total hip arthroplasty    . Partial hysterectomy    . Abdominal hysterectomy      partial     There were no vitals filed for this visit.  Visit Diagnosis:  Lumbar radiculopathy      Subjective Assessment - 03/19/15 1509    Subjective Jasmine Bridges states that she is having progressive pain in her low back radiating into her right hip which is affecting her ability to complete her housework .  She went to her MD who has referred her to physical therapy.  Jasmine Bridges states she can not afford the co-pay but would like to be seen once to be given a HEP.     Pertinent History B THR; back surgery;DM, osteoporosis,    How long can you sit comfortably? no problem   How long can you stand comfortably? Pt states I am always trying tio find a chair.  Therapist tested Pt wanted to sit down after 5 minutes    How long can you walk comfortably? Pt walks very slowly with a cane  for less than five minutes.     Patient Stated Goals To be able to do more.     Currently in Pain? No/denies  able to get as high as a 7/10    Pain Location Hip   Pain Orientation Right            [facility] PT  Assessment - 03/19/15 0001    Assessment   Medical Diagnosis lumbar radiculopathy   Prior Therapy none   Precautions   Precautions Posterior Hip   Restrictions   Weight Bearing Restrictions No   Balance Screen   Has the patient fallen in the past 6 months No   Has the patient had a decrease in activity level because of a fear of falling?  Yes   Is the patient reluctant to leave their home because of a fear of falling?  No   Prior Function   Vocation Retired   Leisure none   Cognition   Overall Cognitive Status Within Functional Limits for tasks assessed   Observation/Other Assessments   Focus on Therapeutic Outcomes (FOTO)  35   Functional Tests   Functional tests Single leg stance   Single Leg Stance   Comments Rt:     ROM / Strength   AROM / PROM / Strength AROM;Strength   AROM   AROM Assessment Site Lumbar   Lumbar Flexion fingers 7" from floor    Lumbar Extension decreasd 50%    Strength  Strength Assessment Site Hip;Knee;Ankle   Right/Left Hip Right;Left   Right Hip Flexion 3+/5   Right Hip Extension 2/5   Right Hip ABduction 2/5   Left Hip Flexion 4-/5   Left Hip Extension 3-/5   Left Hip ABduction 2/5   Right/Left Knee Right;Left   Right/Left Ankle Right;Left   Right Ankle Dorsiflexion 5/5   Right Ankle Plantar Flexion 4/5   Left Ankle Dorsiflexion 5/5   Left Ankle Plantar Flexion 4/5                   [facility] Adult PT Treatment/Exercise - 2015-03-30 0001    Exercises   Exercises Lumbar   Lumbar Exercises: Stretches   Standing Extension 2 reps   Lumbar Exercises: Standing   Heel Raises 10 reps   Functional Squats 10 reps   Lumbar Exercises: Supine   Bridge 10 reps   Straight Leg Raise 10 reps   Other Supine Lumbar Exercises hip abduction B 10 ; scapular retraciton 10 ; decompression exercises 1-5                PT Education - 03/30/15 1548    Education provided Yes   Education Details strengthening for LE, decompression and the  importance of walking    Person(s) Educated Patient   Methods Explanation;Handout;Demonstration   Comprehension Verbalized understanding;Returned demonstration          PT Short Term Goals - 03-30-2015 1603    PT SHORT TERM GOAL #1   Title I in HEP                  Plan - March 30, 2015 1558    Clinical Impression Statement Jasmine Bridges is a 81 yo female who has been referred to physical therapy for radicular back pain.  She states she wants a HEP due to not being able to afford the co-pay.  She was seen for evaluation with the following findings:  Decreased core and B LE strength,decreased balance, poor posture  and gait instabillty.  She was given a comprehensive HEP to address the above deficits.   Rehab Potential Good   PT Next Visit Plan N/A  one time visit.    PT Home Exercise Plan given          G-Codes - 03-30-2015 1604    Functional Limitation Mobility: Walking and moving around   Mobility: Walking and Moving Around Current Status [phone]) At least 60 percent but less than 80 percent impaired, limited or restricted   Mobility: Walking and Moving Around Goal Status [phone]) At least 60 percent but less than 80 percent impaired, limited or restricted   Mobility: Walking and Moving Around Discharge Status [phone]) At least 60 percent but less than 80 percent impaired, limited or restricted       Problem List Patient Active Problem List   Diagnosis Date Noted  . Routine gynecological examination 01/04/2014  . Unspecified disorder of kidney and ureter 12/27/2013  . Type II or unspecified type diabetes mellitus without mention of complication, not stated as uncontrolled 12/27/2013  . Pain in limb 12/27/2013  . Unspecified vitamin D deficiency 12/27/2013  . Edema 12/27/2013  . Other and unspecified hyperlipidemia 12/27/2013  . Cramp of limb 12/27/2013  . Esophageal reflux 12/27/2013  . Rash and other nonspecific skin eruption 12/27/2013  . Backache, unspecified 12/27/2013   . Anxiety state, unspecified 12/27/2013  . Shortness of breath 12/27/2013  . Osteoporosis, unspecified 12/27/2013  . 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  Chronic kidney disease, unspecified 12/27/2013  . Other bursitis disorders 12/27/2013  . DYSPNEA 08/16/2008    Rayetta Humphrey, PT CLT [phone] 03/19/2015, 4:05 PM  [place] [address] [place], [place], [address] Phone: [phone]   Fax:  [phone]

## 2015-06-29 DIAGNOSIS — E782 Mixed hyperlipidemia: Secondary | ICD-10-CM | POA: Diagnosis not present

## 2015-06-29 DIAGNOSIS — D510 Vitamin B12 deficiency anemia due to intrinsic factor deficiency: Secondary | ICD-10-CM | POA: Diagnosis not present

## 2015-06-29 DIAGNOSIS — N390 Urinary tract infection, site not specified: Secondary | ICD-10-CM | POA: Diagnosis not present

## 2015-06-29 DIAGNOSIS — E1122 Type 2 diabetes mellitus with diabetic chronic kidney disease: Secondary | ICD-10-CM | POA: Diagnosis not present

## 2015-06-29 DIAGNOSIS — E559 Vitamin D deficiency, unspecified: Secondary | ICD-10-CM | POA: Diagnosis not present

## 2015-07-03 DIAGNOSIS — H524 Presbyopia: Secondary | ICD-10-CM | POA: Diagnosis not present

## 2015-07-03 DIAGNOSIS — H5210 Myopia, unspecified eye: Secondary | ICD-10-CM | POA: Diagnosis not present

## 2015-07-10 ENCOUNTER — Other Ambulatory Visit (HOSPITAL_COMMUNITY): Payer: Self-pay | Admitting: Internal Medicine

## 2015-07-10 DIAGNOSIS — G3184 Mild cognitive impairment, so stated: Secondary | ICD-10-CM | POA: Diagnosis not present

## 2015-07-10 DIAGNOSIS — M545 Low back pain: Secondary | ICD-10-CM | POA: Diagnosis not present

## 2015-07-10 DIAGNOSIS — M1000 Idiopathic gout, unspecified site: Secondary | ICD-10-CM | POA: Diagnosis not present

## 2015-07-10 DIAGNOSIS — M25559 Pain in unspecified hip: Secondary | ICD-10-CM | POA: Diagnosis not present

## 2015-07-10 DIAGNOSIS — N182 Chronic kidney disease, stage 2 (mild): Secondary | ICD-10-CM | POA: Diagnosis not present

## 2015-07-10 DIAGNOSIS — G589 Mononeuropathy, unspecified: Secondary | ICD-10-CM | POA: Diagnosis not present

## 2015-07-10 DIAGNOSIS — E1141 Type 2 diabetes mellitus with diabetic mononeuropathy: Secondary | ICD-10-CM | POA: Diagnosis not present

## 2015-07-10 DIAGNOSIS — E782 Mixed hyperlipidemia: Secondary | ICD-10-CM | POA: Diagnosis not present

## 2015-07-10 DIAGNOSIS — R0989 Other specified symptoms and signs involving the circulatory and respiratory systems: Secondary | ICD-10-CM

## 2015-07-13 ENCOUNTER — Ambulatory Visit (HOSPITAL_COMMUNITY)
Admission: RE | Admit: 2015-07-13 | Discharge: 2015-07-13 | Disposition: A | Discharge status: Home / Self Care | Payer: Commercial Managed Care - HMO | Source: Ambulatory Visit | Attending: Internal Medicine | Admitting: Internal Medicine

## 2015-07-13 DIAGNOSIS — I6523 Occlusion and stenosis of bilateral carotid arteries: Secondary | ICD-10-CM | POA: Insufficient documentation

## 2015-07-13 DIAGNOSIS — M542 Cervicalgia: Secondary | ICD-10-CM | POA: Insufficient documentation

## 2015-07-13 DIAGNOSIS — R0989 Other specified symptoms and signs involving the circulatory and respiratory systems: Secondary | ICD-10-CM

## 2015-07-20 ENCOUNTER — Other Ambulatory Visit: Payer: Self-pay

## 2015-07-20 DIAGNOSIS — I6521 Occlusion and stenosis of right carotid artery: Secondary | ICD-10-CM

## 2015-07-24 ENCOUNTER — Other Ambulatory Visit: Payer: Self-pay | Admitting: *Deleted

## 2015-07-24 NOTE — Patient Outreach (Signed)
[place] [facility]) Care Management  07/24/2015  Jasmine Bridges 1933-07-22 [id number]   Referral from [place]/MD: Telephone call to patient; left message on voice mail requesting call back.  Plan:  Will follow up. Sherrin Daisy, RN BSN [place] Management Coordinator [facility] Care Management  [phone]

## 2015-07-25 ENCOUNTER — Encounter: Payer: Self-pay | Admitting: *Deleted

## 2015-07-25 ENCOUNTER — Other Ambulatory Visit: Payer: Self-pay | Admitting: *Deleted

## 2015-07-25 NOTE — Patient Outreach (Signed)
[place] [facility]) Care Management  07/25/2015  Jasmine Bridges 06-07-1933 [id number]  Telephone call to patient who was advised of reason for call & [facility] care management services. HIPPA verification received from patient.  Patient wishes to go through screening assessment but at another time.  Plan: Appointment set for screening assessment. Patient in agreement with set time.  Sherrin Daisy, RN BSN [place] Management Coordinator [facility] Care Management  [phone]

## 2015-07-26 ENCOUNTER — Other Ambulatory Visit: Payer: Self-pay | Admitting: *Deleted

## 2015-07-26 NOTE — Patient Outreach (Signed)
[place] [facility]) Care Management  07/26/2015  GENIVA LOOTENS July 05, 1933 [id number]  Telephone call to patient; left message on voice mail requesting call back.  Plan: will follow up. Sherrin Daisy, RN BSN [place] Management Coordinator [facility] Care Management  [phone]

## 2015-07-27 ENCOUNTER — Ambulatory Visit: Payer: Self-pay | Admitting: *Deleted

## 2015-07-31 ENCOUNTER — Encounter: Payer: Self-pay | Admitting: *Deleted

## 2015-07-31 ENCOUNTER — Other Ambulatory Visit: Payer: Self-pay | Admitting: *Deleted

## 2015-07-31 NOTE — Patient Outreach (Addendum)
[place] [facility]) Care Management  07/31/2015  QUINITA HOOK [birth year] [id number]   Telephone call to patient; left message on voice mail requesting return call.  Plan: will send outreach letter then follow up in 10 business days.  Sherrin Daisy, RN BSN [place] Management Coordinator [facility] Care Management  [phone]

## 2015-08-29 ENCOUNTER — Encounter: Payer: Self-pay | Admitting: Vascular Surgery

## 2015-09-04 ENCOUNTER — Ambulatory Visit (HOSPITAL_COMMUNITY)
Admission: RE | Admit: 2015-09-04 | Discharge: 2015-09-04 | Disposition: A | Discharge status: Home / Self Care | Payer: Commercial Managed Care - HMO | Source: Ambulatory Visit | Attending: Vascular Surgery | Admitting: Vascular Surgery

## 2015-09-04 ENCOUNTER — Encounter: Payer: Self-pay | Admitting: Vascular Surgery

## 2015-09-04 ENCOUNTER — Other Ambulatory Visit: Payer: Self-pay | Admitting: *Deleted

## 2015-09-04 ENCOUNTER — Ambulatory Visit (INDEPENDENT_AMBULATORY_CARE_PROVIDER_SITE_OTHER): Payer: Commercial Managed Care - HMO | Admitting: Vascular Surgery

## 2015-09-04 VITALS — BP 154/88 | HR 79 | Ht 63.0 in | Wt 168.8 lb

## 2015-09-04 DIAGNOSIS — E785 Hyperlipidemia, unspecified: Secondary | ICD-10-CM | POA: Diagnosis not present

## 2015-09-04 DIAGNOSIS — I6521 Occlusion and stenosis of right carotid artery: Secondary | ICD-10-CM

## 2015-09-04 DIAGNOSIS — I6523 Occlusion and stenosis of bilateral carotid arteries: Secondary | ICD-10-CM | POA: Insufficient documentation

## 2015-09-04 DIAGNOSIS — I10 Essential (primary) hypertension: Secondary | ICD-10-CM | POA: Insufficient documentation

## 2015-09-04 DIAGNOSIS — E119 Type 2 diabetes mellitus without complications: Secondary | ICD-10-CM | POA: Diagnosis not present

## 2015-09-04 NOTE — Progress Notes (Signed)
Vascular and Vein Specialist of [place]  Patient name: Jasmine Bridges MRN: [id number] DOB: [birth year] Sex: female  REASON FOR CONSULT: Evaluation of carotid disease  HPI: Jasmine Bridges is a 82 y.o. female, who is in today for discussion of recent carotid duplex outpatient facility. She is a very pleasant 82 year old female who was noted to have pain behind her right ear. She reports that this could occur every several days but has not had it several in the past several days. She denies any prior history of amaurosis fugax transient ischemic attack or stroke. She denies any prior history of cardiac disease. She does have a history of stroke in her mother at an advanced age and also myocardial infarction and her husband and also myocardial infarction and her son at an Jasmine Bridges age. Does have multiple risk factors for atherosclerotic disease listed below  Past Medical History  Diagnosis Date  . Arthritis   . Diabetes mellitus type II   . GERD (gastroesophageal reflux disease)   . Hyperlipidemia   . Hypertension   . Mitral insufficiency     Family History  Problem Relation Age of Onset  . Heart attack Father 75  . Heart disease Father     before age 45  . Mitral valve prolapse Daughter   . Coronary artery disease Other   . Stroke Mother   . ALS Brother   . Heart attack Son   . Heart disease Son     before age 68    SOCIAL HISTORY: Social History   Social History  . Marital Status: Widowed    Spouse Name: N/A  . Number of Children: N/A  . Years of Education: N/A   Occupational History  . Not on file.   Social History Main Topics  . Smoking status: Former Research scientist (life sciences)  . Smokeless tobacco: Never Used  . Alcohol Use: No  . Drug Use: No  . Sexual Activity: Not Currently    Birth Control/ Protection: Post-menopausal   Other Topics Concern  . Not on file   Social History Narrative    Allergies  Allergen Reactions  . Adhesive [Tape]   . Latex     Current Outpatient  Prescriptions  Medication Sig Dispense Refill  . allopurinol (ZYLOPRIM) 100 MG tablet Take 100 mg by mouth daily. Reported on 09/04/2015    . ALPRAZolam (XANAX) 0.25 MG tablet Take 0.25 mg by mouth at bedtime as needed for anxiety. Reported on 09/04/2015    . Besifloxacin HCl (BESIVANCE) 0.6 % SUSP Apply to eye.    . Bromfenac Sodium (BROMDAY) 0.09 % SOLN Apply to eye.    . gabapentin (NEURONTIN) 300 MG capsule Take 300 mg by mouth 2 (two) times daily. Reported on 09/04/2015    . meclizine (ANTIVERT) 25 MG tablet Take 25 mg by mouth 3 (three) times daily as needed for dizziness.    Marland Kitchen spironolactone (ALDACTONE) 25 MG tablet Take 25 mg by mouth daily. Reported on 09/04/2015    . torsemide (DEMADEX) 10 MG tablet Take 10 mg by mouth daily. Reported on 09/04/2015    . traMADol (ULTRAM) 50 MG tablet Take by mouth every 6 (six) hours as needed. Reported on 09/04/2015    . nystatin cream (MYCOSTATIN) APPLY TO AFFECTD AREA TWICE DAILY. (Patient not taking: Reported on 09/04/2015) 30 g prn  . POTASSIUM CHLORIDE ER PO Take by mouth. Reported on 09/04/2015    . triamcinolone (KENALOG) 0.025 % ointment APPLY TO AFFECTED AREA TWICE DAILY. (Patient  not taking: Reported on 09/04/2015) 80 g prn   No current facility-administered medications for this visit.    REVIEW OF SYSTEMS:  [X]  denotes positive finding, [ ]  denotes negative finding Cardiac  Comments:  Chest pain or chest pressure:    Shortness of breath upon exertion: x   Short of breath when lying flat:    Irregular heart rhythm: x       Vascular    Pain in calf, thigh, or hip brought on by ambulation:    Pain in feet at night that wakes you up from your sleep:     Blood clot in your veins:    Leg swelling:  x       Pulmonary    Oxygen at home:    Productive cough:     Wheezing:         Neurologic    Sudden weakness in arms or legs:  x  l related to orthopedic issues   Sudden numbness in arms or legs:     Sudden onset of difficulty speaking or slurred  speech:    Temporary loss of vision in one eye:     Problems with dizziness:  x       Gastrointestinal    Blood in stool:     Vomited blood:         Genitourinary    Burning when urinating:     Blood in urine:        Psychiatric    Major depression:         Hematologic    Bleeding problems:    Problems with blood clotting too easily:        Skin    Rashes or ulcers:        Constitutional    Fever or chills:      PHYSICAL EXAM: Filed Vitals:   09/04/15 1424 09/04/15 1433  BP: 158/87 154/88  Pulse: 79   Height: 5\' 3"  (1.6 m)   Weight: 168 lb 12.8 oz (76.567 kg)   SpO2: 99%     GENERAL: The patient is a well-nourished female, in no acute distress. The vital signs are documented above. CARDIAC: There is a regular rate and rhythm.  VASCULAR: 2+ radial, 2+ dorsalis pedis pulses bilaterally.  Carotid arteries without bruits bilaterally PULMONARY: There is good air exchange bilaterally without wheezing or rales. ABDOMEN: Soft and non-tender with normal pitched bowel sounds. No bruits noted MUSCULOSKELETAL: There are no major deformities or cyanosis. NEUROLOGIC: No focal weakness or paresthesias are detected. SKIN: There are no ulcers or rashes noted. PSYCHIATRIC: The patient has a normal affect.  DATA:  She underwent repeat carotid duplex in our office compared to this to the duplex at [facility]. She does have elevated velocities in her distal right common carotid artery extending into her internal carotid artery. Elevated velocities at this level in our lab put her in the 50-69% stenosis range. Her other studies suggested greater than 70% stenosis   MEDICAL ISSUES: Asymptomatic moderate to severe carotid stenosis. I discussed symptoms of carotid disease with patient she knows to notify us should this occur. Otherwise he would recommend yearly carotid duplex follow-up. If she does have progression to critical greater than 80% stenosis, would discuss elective  repair. She is quite resistant to any consideration for surgery most particularly due to her concern regarding general anesthesia. Explained there is no indication to proceed with any plan surgery at this time   Curt Jews [facility]  and Vein Specialists of [place]: [phone]

## 2015-09-07 ENCOUNTER — Encounter: Payer: Self-pay | Admitting: *Deleted

## 2015-09-07 ENCOUNTER — Other Ambulatory Visit: Payer: Self-pay | Admitting: *Deleted

## 2015-09-07 NOTE — Patient Outreach (Signed)
[facility] [facility]) Care Management  09/07/2015  Jasmine Bridges [birth year] [id number]  Patient has not responded to outreach calls since 1st attempt after patient advised RN CM to call at another time. No response to outreach letter.   Telephone to primary care office to advise of no response from patient; Spoke with Angie who voices last office visit for patient Dec 2016 and next appointment in April 2017. States she will advise of patient of [facility] services.  Advised will send MD closure letter.  Advised that case can be opened if patient wishes and is appropriate for [facility] services.   Plan: Send MD closure letter. Advise care management assistant to close out.   Sherrin Daisy, RN BSN [place] Management Coordinator [facility] Care Management  [phone]

## 2015-10-26 ENCOUNTER — Encounter (HOSPITAL_COMMUNITY): Payer: Self-pay

## 2015-11-06 DIAGNOSIS — G589 Mononeuropathy, unspecified: Secondary | ICD-10-CM | POA: Diagnosis not present

## 2015-11-06 DIAGNOSIS — E1122 Type 2 diabetes mellitus with diabetic chronic kidney disease: Secondary | ICD-10-CM | POA: Diagnosis not present

## 2015-11-06 DIAGNOSIS — M1000 Idiopathic gout, unspecified site: Secondary | ICD-10-CM | POA: Diagnosis not present

## 2015-11-06 DIAGNOSIS — G3184 Mild cognitive impairment, so stated: Secondary | ICD-10-CM | POA: Diagnosis not present

## 2015-11-26 DIAGNOSIS — E782 Mixed hyperlipidemia: Secondary | ICD-10-CM | POA: Diagnosis not present

## 2015-11-26 DIAGNOSIS — R030 Elevated blood-pressure reading, without diagnosis of hypertension: Secondary | ICD-10-CM | POA: Diagnosis not present

## 2015-11-26 DIAGNOSIS — N182 Chronic kidney disease, stage 2 (mild): Secondary | ICD-10-CM | POA: Diagnosis not present

## 2015-11-26 DIAGNOSIS — E1121 Type 2 diabetes mellitus with diabetic nephropathy: Secondary | ICD-10-CM | POA: Diagnosis not present

## 2016-04-01 DIAGNOSIS — E1122 Type 2 diabetes mellitus with diabetic chronic kidney disease: Secondary | ICD-10-CM | POA: Diagnosis not present

## 2016-04-01 DIAGNOSIS — E559 Vitamin D deficiency, unspecified: Secondary | ICD-10-CM | POA: Diagnosis not present

## 2016-04-01 DIAGNOSIS — E782 Mixed hyperlipidemia: Secondary | ICD-10-CM | POA: Diagnosis not present

## 2016-04-01 DIAGNOSIS — D519 Vitamin B12 deficiency anemia, unspecified: Secondary | ICD-10-CM | POA: Diagnosis not present

## 2016-04-04 DIAGNOSIS — R42 Dizziness and giddiness: Secondary | ICD-10-CM | POA: Diagnosis not present

## 2016-04-04 DIAGNOSIS — M1000 Idiopathic gout, unspecified site: Secondary | ICD-10-CM | POA: Diagnosis not present

## 2016-04-04 DIAGNOSIS — G3184 Mild cognitive impairment, so stated: Secondary | ICD-10-CM | POA: Diagnosis not present

## 2016-04-04 DIAGNOSIS — Z23 Encounter for immunization: Secondary | ICD-10-CM | POA: Diagnosis not present

## 2016-04-04 DIAGNOSIS — Z0001 Encounter for general adult medical examination with abnormal findings: Secondary | ICD-10-CM | POA: Diagnosis not present

## 2016-04-04 DIAGNOSIS — N182 Chronic kidney disease, stage 2 (mild): Secondary | ICD-10-CM | POA: Diagnosis not present

## 2016-04-04 DIAGNOSIS — E559 Vitamin D deficiency, unspecified: Secondary | ICD-10-CM | POA: Diagnosis not present

## 2016-04-04 DIAGNOSIS — E1122 Type 2 diabetes mellitus with diabetic chronic kidney disease: Secondary | ICD-10-CM | POA: Diagnosis not present

## 2016-04-04 DIAGNOSIS — B3789 Other sites of candidiasis: Secondary | ICD-10-CM | POA: Diagnosis not present

## 2016-04-14 DIAGNOSIS — E1122 Type 2 diabetes mellitus with diabetic chronic kidney disease: Secondary | ICD-10-CM | POA: Diagnosis not present

## 2016-04-14 DIAGNOSIS — G3184 Mild cognitive impairment, so stated: Secondary | ICD-10-CM | POA: Diagnosis not present

## 2016-04-14 DIAGNOSIS — R42 Dizziness and giddiness: Secondary | ICD-10-CM | POA: Diagnosis not present

## 2016-04-14 DIAGNOSIS — N182 Chronic kidney disease, stage 2 (mild): Secondary | ICD-10-CM | POA: Diagnosis not present

## 2016-04-14 DIAGNOSIS — B3789 Other sites of candidiasis: Secondary | ICD-10-CM | POA: Diagnosis not present

## 2016-04-14 DIAGNOSIS — M1000 Idiopathic gout, unspecified site: Secondary | ICD-10-CM | POA: Diagnosis not present

## 2016-04-14 DIAGNOSIS — E559 Vitamin D deficiency, unspecified: Secondary | ICD-10-CM | POA: Diagnosis not present

## 2016-04-14 DIAGNOSIS — Z23 Encounter for immunization: Secondary | ICD-10-CM | POA: Diagnosis not present

## 2016-08-12 DIAGNOSIS — E1122 Type 2 diabetes mellitus with diabetic chronic kidney disease: Secondary | ICD-10-CM | POA: Diagnosis not present

## 2016-08-12 DIAGNOSIS — R42 Dizziness and giddiness: Secondary | ICD-10-CM | POA: Diagnosis not present

## 2016-08-12 DIAGNOSIS — M1000 Idiopathic gout, unspecified site: Secondary | ICD-10-CM | POA: Diagnosis not present

## 2016-08-12 DIAGNOSIS — G3184 Mild cognitive impairment, so stated: Secondary | ICD-10-CM | POA: Diagnosis not present

## 2016-08-22 ENCOUNTER — Other Ambulatory Visit: Payer: Self-pay | Admitting: *Deleted

## 2016-08-22 NOTE — Patient Outreach (Signed)
[facility]) Care Management  08/22/2016  Jasmine Bridges 1933/04/27 [id number]  MD referral; reason-check medication adherence due to cognitive impairment & possible home needs.  Telephone call to patient who is listed as contact person; phone rings then transfers to voice mail which is not accepting voice mail. Attempts x 2 ; unable to leave message.  Plan: Will follow up.  Sherrin Daisy, RN BSN [place] Management Coordinator [facility] Care Management  [phone]

## 2016-08-26 ENCOUNTER — Other Ambulatory Visit: Payer: Self-pay | Admitting: *Deleted

## 2016-08-26 NOTE — Patient Outreach (Signed)
[place]) Care Management  08/26/2016  Jasmine Bridges 19-Apr-1933 [id number]   Referral from MD office; requesting -check medication adherence due to cognitive impairment & assessment of needs in home.  Telephone call to patient who was advised of reason for call and of [facility] care management services. HIPPA verification received from patient.   Patient voices that she does live alone. States she is currently not driving due to car problems but friend takes her to MD appointments. States independent at home regarding her own care but does have issues with managing her home due to her physical limitation. States she has history of hip replacement & back fusion; with arthritis. Using cane to steady herself. States she does have weakness in her body and has list of exercises that she should be doing but is not consistent in doing them.  Patient voices that she is managing her own medications and gets medications delivered to her home. Patient admits to having" memory  problems and does forget things". States she was recently restarted on Namenda XR after she did not get prescription refilled.   Patient states major concerns are memory problems & mobility problems. States she has high blood pressure which is under control and also has "clogged arteries"..   Other listed problems on office notes include mononeuropathy, gout, DM 2, Chronic kidney disease 2,  Vit. D deficiency,  Vit B 12 deficiency anemia, GERD, General anxiety disorder, Localized osteoporosis, Dizziness & giddiness.  Patient has memory deficit, is falls risk, lives alone, needs medication management.   Patient consents to [facility] care management services.  Plan: Refer to care management assistant to assign to [facility] care coordinator for complex case management.  Telephonic signing off.

## 2016-08-27 DIAGNOSIS — R42 Dizziness and giddiness: Secondary | ICD-10-CM | POA: Diagnosis not present

## 2016-08-27 DIAGNOSIS — B3789 Other sites of candidiasis: Secondary | ICD-10-CM | POA: Diagnosis not present

## 2016-08-27 DIAGNOSIS — H8110 Benign paroxysmal vertigo, unspecified ear: Secondary | ICD-10-CM | POA: Diagnosis not present

## 2016-08-27 DIAGNOSIS — G3184 Mild cognitive impairment, so stated: Secondary | ICD-10-CM | POA: Diagnosis not present

## 2016-08-28 ENCOUNTER — Other Ambulatory Visit: Payer: Self-pay | Admitting: *Deleted

## 2016-08-28 NOTE — Patient Outreach (Signed)
[facility]) Care Management  08/28/2016  RANDEL NETTLE [birth year] [id number]   [name] is an 83 year old female living alone in [place]. She has medical history which includes diabetes mellitus type II, Gastroesophageal reflux disease, hyperlipidemia, hypertension, mitral insufficiency, mononeuropathy, gout, Chronic kidney disease 2,  Vit. D deficiency,  Vit B 12 deficiency anemia, generalized anxiety disorder, dizziness & giddiness, localized osteoporosis, arthritis, and is s/p total hip replacement and back fusion.  [name] was referred to [place] Management by her primary care provider who requested assistance with medication adherence due to recent issues concerning for cognitive impairment and level of care/home care needs. [name] was contacted by our telephonic case management team this week and agreed to engagement with [place] Management services.   [name] lives alone, has a friend who takes her to doctor appointments, uses a cane to move about the home but is increasingly limited in her ability to manage her home and independently execute prescribed physical therapy and home exercises. By her own admission, [name] indicates that she is having memory problems and related that she was recently given a prescription for Namenda XR but has not had it filled.    I reached out to [name] by phone today and we had a lengthy conversation about her health concerns and needs. She wants to be able to live in her own home for as long as possible but realizes she has changing needs related to her health. Her memory is failing, she is having more difficulty managing her medications, and she has transportation needs with limited support and resources. Her 2 daughters are marginally supportive per her report and she has supportive friends but they are "older and have their own problems".   [name] agreed to allow me to visit with her at home so we could  discuss her needs in greater detail.   Plan: I will see [name] at home on Monday 09/08/16 @ 10:30.    [place] Management  [phone]

## 2016-08-30 ENCOUNTER — Encounter: Payer: Self-pay | Admitting: *Deleted

## 2016-09-08 ENCOUNTER — Other Ambulatory Visit: Payer: Self-pay | Admitting: *Deleted

## 2016-09-08 ENCOUNTER — Encounter: Payer: Self-pay | Admitting: *Deleted

## 2016-09-08 NOTE — Patient Outreach (Signed)
[facility]) Care Management   09/08/2016  LEONETTE TISCHER 01-15-1933 [id number]  ROYLENE HEATON is an 83 y.o. female living alone in [place]. She has medical history which includes diabetes mellitus type II, Gastroesophageal reflux disease, hyperlipidemia, hypertension, mitral insufficiency, mononeuropathy, gout, Chronic kidney disease 2, Vit. D deficiency, Vit B 12 deficiency anemia, generalized anxiety disorder, dizziness &giddiness, localized osteoporosis, arthritis, and is s/p total hip replacement and back fusion.  Mrs. Porrata was referred to [place] Management by her primary care provider who requested assistance with medication adherence due to recent issues concerning for cognitive impairment and level of care/home care needs.    I met with Mrs. Miner in her home today for an initial home visit to assess needs around medication management and level of care concerns.   Subjective: "I need some help in my house..I'm not remembering like I used to..I'm concerned about my blood pressure"  Objective:  BP 124/76   Pulse 67   Review of Systems  Constitutional:       Patient reports she is weaker than usual and has lower exercise tolerance  HENT: Negative.   Eyes: Negative.   Respiratory: Negative.  Negative for wheezing.   Cardiovascular: Negative for chest pain, palpitations and leg swelling.       No leg swelling appreciated today; patient reports intermittent leg swelling needing prn diuretic  Gastrointestinal: Negative.        Patient reports regular BM at least 1 time/day  Genitourinary: Negative.        Patient reports no difficulty emptying bladder  Musculoskeletal: Negative.  Negative for falls.       Patient reports no falls  Skin: Negative.   Neurological: Positive for weakness.       Patient reports intermittent dizziness which she treats with prescribed Antivert  Psychiatric/Behavioral: Positive for memory loss.    Physical Exam   Constitutional: She is oriented to person, place, and time. Vital signs are normal. She appears well-developed and well-nourished. She is active. She does not have a sickly appearance.  Cardiovascular: Normal rate, regular rhythm and normal heart sounds.  Exam reveals no friction rub.   No murmur heard. Respiratory: Effort normal and breath sounds normal. She has no wheezes. She has no rhonchi. She has no rales.  GI: Soft. Bowel sounds are normal. There is no tenderness.  Musculoskeletal:  Shortened gait; ambulating in the home without assistance or device  Neurological: She is alert and oriented to person, place, and time. Gait abnormal.  Patient's gait is shortened; mild forward head; ambulates without assistance or device but balance is questionable  Skin: Skin is warm, dry and intact.  Psychiatric: She has a normal mood and affect. Her speech is normal and behavior is normal. Judgment and thought content normal. She exhibits abnormal recent memory.  Patient's remote memory is exceptional and quite detailed; patient herself reports noticing that her short term memory is worsening   Outpatient Encounter Prescriptions as of 09/08/2016  Medication Sig  . allopurinol (ZYLOPRIM) 100 MG tablet Take 100 mg by mouth daily. Reported on 09/04/2015  . gabapentin (NEURONTIN) 300 MG capsule Take 300 mg by mouth 2 (two) times daily. Reported on 09/04/2015  . Memantine HCl ER (NAMENDA XR TITRATION PACK) 7 & 14 & 21 &28 MG CP24 Take by mouth.  . nystatin cream (MYCOSTATIN) APPLY TO AFFECTD AREA TWICE DAILY.  Marland Kitchen POTASSIUM CHLORIDE ER PO Take 10 mEq by mouth daily. Reported on 09/04/2015  . spironolactone (  ALDACTONE) 25 MG tablet Take 25 mg by mouth daily. Reported on 09/04/2015  . torsemide (DEMADEX) 10 MG tablet Take 10 mg by mouth daily. Reported on 09/04/2015  . traMADol (ULTRAM) 50 MG tablet Take by mouth every 6 (six) hours as needed. Reported on 09/04/2015  . ALPRAZolam (XANAX) 0.25 MG tablet Take 0.25 mg by  mouth at bedtime as needed for anxiety. Reported on 09/04/2015  . Besifloxacin HCl (BESIVANCE) 0.6 % SUSP Apply to eye.  . Bromfenac Sodium (BROMDAY) 0.09 % SOLN Apply to eye.  . meclizine (ANTIVERT) 25 MG tablet Take 25 mg by mouth 3 (three) times daily as needed for dizziness.  . triamcinolone (KENALOG) 0.025 % ointment APPLY TO AFFECTED AREA TWICE DAILY. (Patient not taking: Reported on 09/04/2015)    Functional Status:   In your present state of health, do you have any difficulty performing the following activities: 08/29/2016  Hearing? N  Vision? N  Difficulty concentrating or making decisions? Y  Walking or climbing stairs? N  Dressing or bathing? N  Doing errands, shopping? Y  Preparing Food and eating ? N  Using the Toilet? N  In the past six months, have you accidently leaked urine? N  Do you have problems with loss of bowel control? N  Managing your Medications? Y  Managing your Finances? Y  Housekeeping or managing your Housekeeping? Y  Some recent data might be hidden    Fall/Depression Screening:    PHQ 2/9 Scores 08/28/2016  PHQ - 2 Score 0    Assessment:    Level of Care Considerations related to worsening short term memory and self care concerns - Mrs. Parslow has a 4 year degree in biology which she earned in 1957 at [employer] 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 Her recollection of the details surrounding her education/training/and professional life is astoundingly detailed. She was superb long term memory.   Unfortunately, Mrs. Ballinas reports that she knows she has declining short term memory and that this is beginning to present problems and concerns related to her independence and safety.   Mrs. Werden lives alone in her home in [place]. She has 2  daughters, one of whom lives in [place] and one of whom lives in [place]. She has a friend who is supportive but unable to provide assistance. She also has a neighbor whom she refers to as her "play daughter" who comes by or calls daily and checks in on her. Mrs. Zadrozny also has a god-daughter whom she helped raise who apparently is very supportive and helpful to her.  Despite the support group in Mrs. Connery's life, it is concerning that she is struggling with short term memory and worsening/limited mobility. She asked about having someone come in to her home to help with ADL's/IADL's and I told her I would bring information and we would have further discussion about this on my return later in the week. She says she makes too much money for Medicaid. As such, there are a few programs in the area to which she can pay out of pocket for in home care services. She is also aware of the [facility]. We discussed this briefly and I informed her that she might be eligible to go  to the [facility] programs 2 days/week at no cost.   While these additional supportive measures may make it possible for Mrs. Sobalvarro to live alone for a while longer, I am concerned that it may not be a realistic long term plan. When we speak again on Thursday, I will ask her permission to speak with her daughter re: my concerns.   No Advanced Directives In Place - I presented Mrs. Dial with an advanced directives packet today and discussed with her at length health care power of attorney and how to proceed with conversation with her family regarding her wishes. Mrs. Riggan will review the materials provided. I encouraged her to discuss this with her daughters. Will follow up with her to address any questions she may have.   Medication Management Concerns - I reviewed medications with Mrs. Reiger today. She has a good working knowledge of her medications but says she would like to have a pill box to help her keep  organized and to avoid missing doses. I plan to bring a pill box (or 2) to her later this week, review medications again, and fill her pill box. For the long term, it might be a good idea to consider pre-filled blister packs. I will discuss this with her further on our next visit.   Chronic Health Condition Management Concerns (HTN) - Mrs. Ron knows she has hypertension and knows it is important for her to monitor it and take her medications. She has a wrist model BP monitor at home but it didn't have batteries. We put batteries in it and showed her how to use it but she isn't confident about measuring it. I will review with her again later this week when I return to help her with pill boxes. Mrs. Bednarski's exercise has been limited because of cold weather. She doesn't participate in regular exercise but does walk around her home and does light housekeeping.    Plan: I will see Mrs. Sligar at home for medication management and follow up on Thursday 09/11/16.   THN CM Care Plan Problem One   Flowsheet Row Most Recent Value  Care Plan Problem One  Medication Adherence/Management needs  Role Documenting the Problem One  Care Management Coordinator  Care Plan for Problem One  Active  THN Long Term Goal (31-90 days)  Over the next 60 days, patient will verbalize understanding of plan institued to promote medication adherence  THN Long Term Goal Start Date  08/28/16  Interventions for Problem One Long Term Goal  Medications reviewed by phone,  discussed options for adherence with patient,  planned home/ face to face visit  THN CM Short Term Goal #1 (0-30 days)  Over the next 14 days, patient will meet with RNCM to review medicaitons and devise plan for adherence  THN CM Short Term Goal #1 Start Date  08/28/16  [facility] CM Short Term Goal #1 Met Date  09/08/16  THN CM Short Term Goal #2 (0-30 days)  Over the next 30 days, patient will take medications as prescribed as evidenced by pill count/medication review by  [facility]  THN CM Short Term Goal #2 Start Date  08/28/16  Interventions for Short Term Goal #2  Medications reviewed,  scheduled home visit    [facility] CM Care Plan Problem Two   Flowsheet Row Most Recent Value  Care Plan Problem Two  Fall Risk/Home Safety Concerns  Role Documenting the Problem Two  Care Management [place] for Problem Two  Active  Interventions for Problem Two Long Term Goal   Discussed fall risk/home safety concerns with patient,  planned home visit for full fall assessment and establishement of plan of care  THN Long Term Goal (31-90) days  Over the next 31 days, patient will verbalize understanding of plan to minimize home safety/fall risk concerns  THN Long Term Goal Start Date  08/28/16  THN CM Short Term Goal #1 (0-30 days)  Over the next 14 days, patient will participate in fall risk assessment in person with RNCM  THN CM Short Term Goal #1 Start Date  08/28/16  [facility] CM Short Term Goal #1 Met Date   09/08/16    [facility] CM Care Plan Problem Three   Flowsheet Row Most Recent Value  Care Plan Problem Three  Level of care concerns  Role Documenting the Problem Three  Care Management Coordinator  Care Plan for Problem Three  Active  THN Long Term Goal (31-90) days  Over the next 31 days, patient will verbalize understanding of plan of care to address level of care concerns  THN Long Term Goal Start Date  08/28/16  Interventions for Problem Three Long Term Goal  discussed level of care needs with patient,  scheduled home visit to discuss in detail  THN CM Short Term Goal #1 (0-30 days)  Over the next 30 days, patient will consider options for in home care assitance  [facility] CM Short Term Goal #1 Start Date  09/08/16  Interventions for Short Term Goal #1  discussed with patient options for in home care assistance  THN CM Short Term Goal #2 (0-30 days)  Over the next 30 days, patient will discuss long term planning with her daughters, to include Advanced Directives and long  term care options   THN CM Short Term Goal #2 Start Date  09/08/16  Interventions for Short Term Goal #2  Discussed Advanced Directives with patient and provided AD Packet,  discussed care options and need to discuss with children      [place] Management  [phone]

## 2016-09-09 ENCOUNTER — Encounter (HOSPITAL_COMMUNITY): Payer: Commercial Managed Care - HMO

## 2016-09-09 ENCOUNTER — Ambulatory Visit: Payer: Commercial Managed Care - HMO | Admitting: Family

## 2016-09-11 ENCOUNTER — Other Ambulatory Visit: Payer: Self-pay | Admitting: *Deleted

## 2016-09-11 NOTE — Patient Outreach (Signed)
[facility]) Care Management  09/11/2016  Jasmine Bridges [birth year] [id number]  Jasmine Bridges is an 83 y.o. female living alone in [place]. She has medical history which includes diabetes mellitus type II, Gastroesophageal reflux disease, hyperlipidemia, hypertension, mitral insufficiency, mononeuropathy, gout, Chronic kidney disease 2, Vit. D deficiency, Vit B 12 deficiency anemia, generalizedanxiety disorder, dizziness &giddiness, localized osteoporosis, arthritis, and is s/p total hip replacement and back fusion.  I am seeing Jasmine Bridges at home today to review medications and fill pill boxes for her and to continue our discussion about advance directives and level of care needs.   Medication Management - Jasmine Bridges had medications in a canvas bag, medications in a grocery bag, and medications in her bedroom. She had difficulty finding some of her medications. And she needed help with a refill on her potassium. Jasmine Bridges also has a bottle of colchicine last filled in 2004 which she says she uses periodically for flare ups of gout.   Tramadol - taking every am and pm rather than prn   Xanax - taking every am and pm rather than prn   Vit D 5,000IU  - not on prescribed medication list; taking daily; buys over the counter   Namenda - Jasmine Bridges has 3 days left in her Namenda taper pack and said that while she was taking 7mg  and 14mg  doses, she did not experience side effects. However, when she began the 21mg  and 28mg  capsules, she began having dizziness and lightheadedness.   I asked Jasmine Bridges's permission to reach out to her daughter to discuss her medication management and general health needs. She granted me permission. I believe Jasmine Bridges could likely continue to live successfully in her home alone if she has more involvement and supervision from her daughter and family friends for medication management and assistance with routine daily home maintenance and activities.    Plan: I notified Dr. Nevada Crane via Alene Mires, Practice Administrator of Jasmine Bridges's medication management concerns and will follow up with Jasmine Bridges with any changes or orders.   I will see Jasmine Bridges at home again next week on Tuesday to monitor her progress with medications and to further discuss needs around in home care and advanced directives.    [place] [facility]  [phone]

## 2016-09-12 ENCOUNTER — Other Ambulatory Visit: Payer: Self-pay | Admitting: *Deleted

## 2016-09-12 NOTE — Patient Outreach (Signed)
[facility]) Care Management  09/12/2016  Jasmine Bridges 05/06/1933 [id number]  Called received from [name] today.  She inquired about directions for continued administration of her namenda and if CM had heard an update from Dr Nevada Crane.  Voiced concern about continuing the 28 mg dose and not wanting to run out of medicatoin. Pt informed CM was able to discuss her Namenda concerns with Dr Juel Burrow assistant. Pending a response. Pt discussed having increase difficulty with walking today but denied pain when CM assessed for s/s.  [name] also discussed she was asked to go "chair shopping with my friend" today. [name] states she was concern with her friend difficulty in walking also.  [name] encouraged to use her cane if she decides on going shopping with her friend Attempt to contact Simone Curia, Daughter as per [name] request.   Left Voice message for return call.  Barbaraann Faster, RN, CCM, [facility] CM   [facility] [name] [facility]  [phone]

## 2016-09-16 ENCOUNTER — Other Ambulatory Visit: Payer: Self-pay | Admitting: *Deleted

## 2016-09-16 NOTE — Patient Outreach (Signed)
[facility]) Care Management  09/16/2016  Jasmine Bridges 1933-01-08 [id number]  Brief follow up visit today with Jasmine Bridges to follow up on medication management and address ongoing questions regarding advanced directives and level of care concerns. I was able to speak with Jasmine Bridges by phone during my visit and explained our involvement with Jasmine Bridges and the current care plan.   Medication Management - Jasmine Bridges recently completed a titrated dose pack of Namenda (7mg /14mg /21mg /28mg ). Jasmine Bridges says she felt well on 7mg /14mg  but noted dizziness/lightheadedness on 21mg /28mg . I inquired with Dr. Nevada Crane re: his orders for Namenda dose ongoing. He is going to send in prescriptions for Namenda 14mg  to be taken daily and Aricept 5mg  to be taken daily. I explained these instructions to Jasmine Bridges and to her daughter Jasmine Bridges by phone.   Jasmine Bridges would benefit from the involvement of a family member or friend for oversight of medication management. She likes her pill boxes but I am not convinced that she could fill them herself each week. I asked her if she thought one of her family members would be willing to become involved. Her daughter Jasmine Bridges liked the idea of the pill boxes and felt her sister Jasmine Bridges who lives locally might be able to help.  Level of Care Concerns - Jasmine Bridges's income exceeds limits to allow her to qualify for Medicaid which would afford her a limited level of in home care at no cost to her. We have discussed private pay services through organizations in [facility] like [facility] and she has been considering this. Jasmine Bridges is not interested in moving from her home and wishes to remain there and be as independent as possible for as long as possible. I discussed this with Jasmine Bridges (dtr) by phone today and Jasmine Bridges and Jasmine Bridges agree to a visit from [facility] to provide information about services.   Advanced Directives - I provided Jasmine Bridges with Presenter, broadcasting and reviewed it with her on our first visit, asking her to discuss it further with her daughters. She told me last week that she had not had an opportunity to discuss it but would look over the materials and talk to her daughters about it. Today, we did not discuss Advanced Directives as our time was spent on medications and in home care services. We will come back to this need at a later time.  Plan: I will refer Jasmine Bridges to [facility]. I will follow up with Jasmine Bridges. I will see Jasmine Bridges at home briefly on Thursday to be sure her new Namenda and Aricept doses are correctly placed in her box.    [place] Management  [phone]

## 2016-09-18 ENCOUNTER — Ambulatory Visit: Payer: Self-pay | Admitting: *Deleted

## 2016-09-19 ENCOUNTER — Ambulatory Visit: Payer: Self-pay | Admitting: *Deleted

## 2016-09-24 ENCOUNTER — Other Ambulatory Visit: Payer: Self-pay | Admitting: *Deleted

## 2016-09-24 NOTE — Patient Outreach (Signed)
[facility]) Care Management   09/24/2016  [name] 1932-10-21 [id number]  [name] is an 83 y.o. female living alone in [place]. She has medical history which includes diabetes mellitus type II, Gastroesophageal reflux disease, hyperlipidemia, hypertension, mitral insufficiency, mononeuropathy, gout, Chronic kidney disease 2, Vit. D deficiency, Vit B 12 deficiency anemia, generalizedanxiety disorder, dizziness &giddiness, localized osteoporosis, arthritis, and is s/p total hip replacement and back fusion.  I am seeing Jasmine Bridges at home today to review medications and fill pill boxes for her and to continue our discussion about advance directives and level of care needs.   Subjective: "I'm feeling pretty good. Just need to get somebody who can come in and help me a little around here."  Objective:  BP 118/74   Pulse 74   SpO2 98%   Review of Systems  Constitutional: Negative.   HENT: Negative.   Eyes: Negative.   Respiratory: Negative for cough and shortness of breath.   Cardiovascular: Negative for chest pain and leg swelling.  Gastrointestinal: Negative.   Genitourinary: Negative.   Musculoskeletal: Positive for myalgias. Negative for falls.  Skin: Negative.   Neurological: Negative.   Psychiatric/Behavioral: Positive for memory loss.    Physical Exam  Constitutional: She is oriented to person, place, and time. Vital signs are normal. She appears well-developed and well-nourished. She is active. She does not have a sickly appearance. She does not appear ill.  Cardiovascular: Normal rate and regular rhythm.   Respiratory: Effort normal and breath sounds normal. She has no wheezes. She has no rhonchi. She has no rales.  GI: Soft. Bowel sounds are normal. There is no tenderness.  Neurological: She is alert and oriented to person, place, and time.  Skin: Skin is warm, dry and intact.  Psychiatric: She has a normal mood and affect. Her speech is  normal and behavior is normal. Thought content normal. She exhibits abnormal recent memory.    Encounter Medications:   Outpatient Encounter Prescriptions as of 09/24/2016  Medication Sig Note  . allopurinol (ZYLOPRIM) 100 MG tablet Take 100 mg by mouth daily. Reported on 09/04/2015   . ALPRAZolam (XANAX) 0.25 MG tablet Take 0.25 mg by mouth at bedtime as needed for anxiety. Reported on 09/04/2015 09/08/2016: On hand; has refill  . gabapentin (NEURONTIN) 300 MG capsule Take 300 mg by mouth 2 (two) times daily. Reported on 09/04/2015   . memantine (NAMENDA XR) 14 MG CP24 24 hr capsule Take 14 mg by mouth.   . nystatin cream (MYCOSTATIN) APPLY TO AFFECTD AREA TWICE DAILY. 09/08/2016: Has on hand; uses prn  . POTASSIUM CHLORIDE ER PO Take 10 mEq by mouth daily. Reported on 09/04/2015 09/11/2016: Assisted with refill call in  . spironolactone (ALDACTONE) 25 MG tablet Take 25 mg by mouth daily. Reported on 09/04/2015   . torsemide (DEMADEX) 10 MG tablet Take 10 mg by mouth daily. Reported on 09/04/2015 09/08/2016: On hand; no refill since 11/2015; takes "when I see swelling"  . traMADol (ULTRAM) 50 MG tablet Take by mouth every 6 (six) hours as needed. Reported on 09/04/2015   . Besifloxacin HCl (BESIVANCE) 0.6 % SUSP Apply to eye. 09/08/2016: completed  . Bromfenac Sodium (BROMDAY) 0.09 % SOLN Apply to eye. 09/08/2016: completed  . meclizine (ANTIVERT) 25 MG tablet Take 25 mg by mouth 3 (three) times daily as needed for dizziness. 09/08/2016: On hand; has not taken "in a while"  . Memantine HCl ER (NAMENDA XR TITRATION PACK) 7 & 14 &  21 &28 MG CP24 Take by mouth. 09/24/2016: Completed; now taking 14mg  qhs  . triamcinolone (KENALOG) 0.025 % ointment APPLY TO AFFECTED AREA TWICE DAILY. (Patient not taking: Reported on 09/24/2016) 09/24/2016: On hand   Assessment:  83 year old female patient living in [place] alone in her home. She has family nearby and a supportive daughter who lives in the [place] area and keeps close  contact by phone. Patient has been showing signs of worsening memory which is affecting medication management. She is becoming somewhat socially isolated and also has some difficulty with home management.   Medication Management - Jasmine Bridges recently completed a titrated dose pack of Namenda (7mg /14mg /21mg /28mg ). Jasmine Bridges says she felt well on 7mg /14mg  but noted dizziness/lightheadedness on 21mg /28mg . Dr.Hall prescribed Namenda 14mg  to be taken daily. Jasmine Bridges received this delivery and I filled her pill boxes to include the [place]. I understood per my last conversation with the office staff that he may also prescribe Aricept 5mg  but Mrs. Maina does not have Aricept. I will reach out to Dr. Nevada Crane to inquire about this.    Jasmine Bridges would benefit from the involvement of a family member or friend for oversight of medication management. She likes her pill boxes but I am not convinced that she could fill them herself each week. I asked her if she thought one of her family members would be willing to become involved. Her daughter Jasmine Bridges liked the idea of the pill boxes and felt her sister Jasmine Bridges who lives locally might be able to help. I called Jasmine Bridges and left a HIPPA compliant voice message requesting a return call so we could discuss this idea further.   Level of Care Concerns - [name]'s income exceeds limits to allow her to qualify for Medicaid which would afford her a limited level of in home care at no cost to her. We have discussed private pay services through organizations in [facility] like [facility] and she has been considering this. Jasmine Bridges is not interested in moving from her home and wishes to remain there and be as independent as possible for as long as possible. I discussed this with Jasmine Bridges (dtr) by phone and Jasmine Bridges and Jasmine Bridges agree to a visit from [facility] to provide information about services. Jasmine Bridges has not heard from [facility] after my outreach last visit so I called again  today to ensure the referral was received.   Advanced Directives - I provided Jasmine Bridges with Electronics engineer and reviewed it with her on our first visit, asking her to discuss it further with her daughters. I reminded Mrs. Lavalley that she has the materials and told her I would also speak with her daughter about it when she returned a call to me.   Plan:    Outreach to [facility] to follow up on referral.   Outreach to Dr. Nevada Crane regarding medication management.   I will follow up with Mrs. Morrisette by phone and will schedule another home visit with her for ongoing assistance with health care concerns as outlined above.    [place] Management  [phone]

## 2016-10-06 ENCOUNTER — Other Ambulatory Visit: Payer: Self-pay | Admitting: *Deleted

## 2016-10-06 NOTE — Patient Outreach (Signed)
[facility]) Care Management  10/06/2016  Jasmine Bridges Jul 17, 1933 [id number]  Call received from Jasmine Bridges today who said she was running out of Allopurinol and couldn't remember if it had been called in or if it had been delivered and she was just not able to find it. I reached out to [place] on her behalf but they are closed due to inclement weather. I notified Jasmine Bridges of the same and told her we could try again tomorrow.   Plan: I will follow up with Jasmine Bridges by phone tomorrow after outreach to [employer].    [place] Management  [phone]

## 2016-10-07 ENCOUNTER — Other Ambulatory Visit: Payer: Self-pay | Admitting: *Deleted

## 2016-10-07 NOTE — Patient Outreach (Signed)
[facility]) Care Management  10/07/2016  DANALY BARI 1932-10-27 [id number]   Unsuccessful call to Mrs Melaragno after consulting with [facility] at [facility] [phone]) about pt Allopurinol prescription. Mrs Winslow's telephone voice mailbox is full and unable to receive further messages at this time  Discussed with [facility] Mrs Guarino concern that she is "running out of" allopurinol 100 mg that she takes daily   Vikki Ports able to find that the medication shows not due until October 10 2016 and a Prescription available for further refills.  Vikki Ports confirms the concern may be that there were only 28 days in February 2018.  Chelsea confirms that the allopurinol just needs to be called in for filling from pt.  THN CM assisted by calling prescription for pt. Pharmacy will attempt to deliver medicine today   Plan: I will make another call to Mrs Paradiso today to update her on results of call to pharmacy, discuss how to call in her medications to pharmacy and to re assess for further needs.  Cadynce Garrette L. Lavina Hamman, RN, BSN, [place] Care Management [phone]

## 2016-10-07 NOTE — Patient Outreach (Signed)
[facility]) Care Management  10/07/2016  ERISA MEHLMAN 30-Jan-1933 [id number]  Transition of primary community case manager to Jackelyn Poling RN, BSN.    [place] Management  [phone]

## 2016-10-07 NOTE — Patient Outreach (Signed)
[facility]) Care Management  10/07/2016  SHANQUITA RONNING [birth year] [id number]  Second attempt to reach Mrs Span was successful.  Pt appreciative of call. Stated "I have a problem."    Medication Management:  Mrs Priced discussed that [facility] had delivered her Allopurinol to her home today after Cm called in this medication.  THN CM updated Mrs Lanese about Cm speaking with Vikki Ports at [facility] and [place] confirmed pt medicine would be delivered today.  Mrs Hernan thanked Cm for assistance but states she has noted that her "allopurinol and aldactone have the same numbers on them".   Plan: THN CM and Mrs Faivre agreed for CM to visit her home on 10/08/16 when available to review her medications  Kimberly L. Lavina Hamman, RN, BSN, [place] Care Management [phone]

## 2016-10-08 ENCOUNTER — Other Ambulatory Visit: Payer: Self-pay | Admitting: *Deleted

## 2016-10-08 NOTE — Patient Outreach (Addendum)
[facility] [facility]) Care Management   10/08/2016  Jasmine Bridges 06-04-1933 [id number]  Jasmine Bridges is an 83 y.o. female  Subjective:   Objective:   Review of Systems  Constitutional: Negative.   HENT: Negative.   Eyes: Negative.   Respiratory: Negative.   Cardiovascular: Negative.   Gastrointestinal: Negative.   Musculoskeletal: Negative.   Skin: Negative.   Neurological: Negative.   Endo/Heme/Allergies: Negative.   Psychiatric/Behavioral: Positive for memory loss.    Physical Exam  Constitutional: She is oriented to person, place, and time. She appears well-developed and well-nourished.  HENT:  Head: Normocephalic and atraumatic.  Neck: Normal range of motion. Neck supple.  Cardiovascular: Normal rate, regular rhythm and normal heart sounds.   Respiratory: Effort normal.  GI: Soft.  Musculoskeletal: Normal range of motion.  Neurological: She is alert and oriented to person, place, and time.  Skin: Skin is warm and dry.  Psychiatric:  Memory issues noted. Instructions on allopurinol and spironolactone needed repeating x 3 within 10 minutes apart. She would voice understanding after asked to verbalize color, shape and use of allopurinol and spironolactone then 10 minutes later forget.    Encounter Medications:   Outpatient Encounter Prescriptions as of 10/08/2016  Medication Sig Note  . allopurinol (ZYLOPRIM) 100 MG tablet Take 100 mg by mouth daily. Reported on 09/04/2015 10/06/2016: Needs refill; can't remember if refill was called in; RNCM with [facility] called [facility] for refill request  . ALPRAZolam (XANAX) 0.25 MG tablet Take 0.25 mg by mouth at bedtime as needed for anxiety. Reported on 09/04/2015 09/08/2016: On hand; has refill  . Besifloxacin HCl (BESIVANCE) 0.6 % SUSP Apply to eye. 09/08/2016: completed  . Bromfenac Sodium (BROMDAY) 0.09 % SOLN Apply to eye. 09/08/2016: completed  . gabapentin (NEURONTIN) 300 MG capsule Take 300 mg by mouth 2 (two)  times daily. Reported on 09/04/2015   . meclizine (ANTIVERT) 25 MG tablet Take 25 mg by mouth 3 (three) times daily as needed for dizziness. 09/08/2016: On hand; has not taken "in a while"  . memantine (NAMENDA XR) 14 MG CP24 24 hr capsule Take 14 mg by mouth.   . Memantine HCl ER (NAMENDA XR TITRATION PACK) 7 & 14 & 21 &28 MG CP24 Take by mouth. 09/24/2016: Completed; now taking 14mg  qhs  . nystatin cream (MYCOSTATIN) APPLY TO AFFECTD AREA TWICE DAILY. 09/08/2016: Has on hand; uses prn  . POTASSIUM CHLORIDE ER PO Take 10 mEq by mouth daily. Reported on 09/04/2015 09/11/2016: Assisted with refill call in  . spironolactone (ALDACTONE) 25 MG tablet Take 25 mg by mouth daily. Reported on 09/04/2015   . torsemide (DEMADEX) 10 MG tablet Take 10 mg by mouth daily. Reported on 09/04/2015 09/08/2016: On hand; no refill since 11/2015; takes "when I see swelling"  . traMADol (ULTRAM) 50 MG tablet Take by mouth every 6 (six) hours as needed. Reported on 09/04/2015   . triamcinolone (KENALOG) 0.025 % ointment APPLY TO AFFECTED AREA TWICE DAILY. (Patient not taking: Reported on 09/24/2016) 09/24/2016: On hand   No facility-administered encounter medications on file as of 10/08/2016.    Home visit today with Jasmine Bridges to follow up on medication management, concerns with her noting allopurinol tablets in a spironolactone bottle and inquire about call from [facility].   Medication Management - [name] informed CM during a follow up call to her on 10/07/16 that she "had a problem", noted tabletsl looking like allopurinol tablets in a spironolactone bottle.  THN CM arrived to  pt home to view her medication bottles.  CM noted on 10/08/16 that there were indeed 7 allopurinol tablets in a spironolactone bottle dated 07/23/17. The delivered Allopurinol and 7 tablets were compared and determined to all be allopurinol tablets. THN CM spoke with Kayla and April (pharmacist) at [facility] at [phone] on speaker phone and it was determined pt  had not obtained spironolactone since 08/25/16 so her spironolactone bottle should have been empty. April will call to Dr Nevada Crane to see if Mrs Bridges can get further refills on spironolactone.  Kayla and April confirms that pt Spironolactone was changed previous to a small orange tab with TL 216 vs a white tab and believes pt may have confused the two tablets. The seven tablets were identified as allopurinol and placed in the bottle of 30 tablets allopurinol delivered by the pharmacy on 10/07/16.  The pt voiced understanding  CM filled pt's pill box with medications for the next two weeks.  Cm noted xanax in pill box. Pt confirms she keeps xanax at bedside but takes Xanax prn minimally.  Removed Xanax ans placed in her xanax pill bottle so pt would not be confused about why xanax in pill box.  Cm again took all pt's prn medications and placed them in a separate bag and tied bag off.  CM took all Active medications and placed them in a separate sandwich bag labelled active medicines. Removed lables from all empty bottles and disposed of bottles in trash and placed in trash bin outside of home. Left Colace bottle on table for pt to get further at her store of choice.  After completing this CM noted pt needed to further medications from pharmacy and spoke with Kayla at 1641 about further filling prescriptions for Namenda and potassium (for delivery).   CM printed color copies of each her allopurinol, spironolactone, namenda, neurontin, potassium and xanax. Reviewed these in detail with the pt. Discussed that most medications can come in different colors and shapes.  Encouraged her to call pharmacy when she questions medications in her pill box and to refer to print outs prn.    Memory issues noted. Instructions on allopurinol and spironolactone needed repeating x 3 within 10 minutes apart. She voiced understanding after asked to verbalize color, shape and use of allopurinol and spironolactone after instructions  provided after the call to the pharmacy. Then 10 minutes later she forget and asked what the spironolactone tablets were. Prior to [facility] CM leaving the home she had to be re instructed again on the color, shape and dose of spironolactone.  She verbalizes that she knows it is important to take it for her diuretic and BP.  Mrs. Ramos would benefit from the involvement of a family member or friend for oversight of medication management. She confirms at this time that her daughter who lives locally nor her "play daughter in a home across the street" from her initiated this involvement. CM encouraged pt to speak with her daughter and "play daughter" about assisting her with her medications.  Home care services - Mrs. Pafford's income indicates the need for private pay services through organizations in [place] like [place]. She informed CM she previous attempted to get services for meals on wheels but without success "about two years ago.  I called then a year later called again and she told me I was last on the list" [facility] to provide information about services.  Mrs Warzecha informs CM she has not had a home visit from [facility]  and does not feel that they will respond to her need. She did agree for CM to check on the status of [facility] referral  Advanced Directives - Mrs Hogen has been provided with Advanced Directives materials reviewed on the first home visit, a but declines further assistance with advanced directives today.   Plan:   I will call Mrs Bonaventura this week to confirm she has obtained the medications called in to [place] on 10/08/16 and scheduled to be delivered to her.  Plus update on any update on [facility] status

## 2016-10-10 ENCOUNTER — Other Ambulatory Visit: Payer: Self-pay | Admitting: *Deleted

## 2016-10-10 NOTE — Patient Outreach (Addendum)
[facility]) Care Management  10/10/2016  Jasmine Bridges 1933-06-28 [id number]   THN CM followed up with ADTS of [facility] on pt referral to ADTS for Personal care services [facility]).    Personal care services  Spoke with Eather Colas. at [phone] and confirmed there was not a call for Jasmine Bridges for [facility] service in the past month noted. Jasmine Bridges confirmed that patients generally receive a letter ("PCS Request for Assessment/Attestation Form" and send to [place] designated by [facility])) in the mail for the patient to take to their doctor. Jasmine Bridges states she has not received any calls or mail for ADTS services on 10/08/16. Reviewed Jasmine Bridges's memory concerns, needs for assistance with medication management and household tasks (laundry & cleaning), shopping and errands with [place].  Cm provided referral information in order to get an ADTS home visit from [facility].  Jasmine Bridges discussed that related to pt insurance coverage she would be considered as private pay, PACE option and LEAF option related to pt memory concerns plus would need a CNA not companion for medication assistance. This cost would be $17/hour with a minimal of 2 hour visits, totally minimally at $34/day  [facility] CM shared that Jasmine Bridges has voiced not wanting to leave her home to go to local senior facilities and programs and that private pay services was discussed with Jasmine Bridges during previous visits.  THN CM left her THN mobile to be contacted in order to try to assist as needed with a future ADTS assessment visit.  Plan: CM will follow up with Jasmine Bridges today about this call to ADTS in detail and confirm she has received her delivery of medications called in on 10/08/16 Sent email to Anell Barr at [employer] office for an update on [employer]. Lavina Hamman, RN, BSN, [facility] [phone]

## 2016-10-10 NOTE — Patient Outreach (Signed)
[facility]) Care Management  10/10/2016  Jasmine Bridges Jul 18, 1933 [id number]   [facility] CM spoke with Jasmine Bridges to review the call to ADTS.  Reviewed referral completed with Juliann Pulse, the options and costs for services provided.    Jasmine Bridges initially not willing to continue with ADTS assessment process related to "I don't know about the seventeen dollars and hour"  until CM discussed the options that she does not have to have ADTS personal care services [facility]) staff in every day for more than 2 hours.  CM inquired about other options like her daughter that lives locally, [place], the "play daughter across the street" and local church or family members.  Pt confirms that [place] "has asthma. I spoke with her yesterday. She was home two days sick with a virus".  Jasmine Bridges states the "play daughter across the street has a business of her own to run."  She reports there are no local church or family members that is able to assist at this time.  CM informed her that medicare does not pay for part of the $17/hr cost when she inquired.  Discussed medicaid may provide some assistance but the pt does not have medicaid.  THN CM discussed PACE and LEAF but pt confirmed again that she preferred not to leave her home to go to local facilities.    Jasmine Bridges finally agreed to allow ADTS staff to complete an assessment to see what possible other options and services are available. She was able to calculate the cost for 2 hrs at $34 and 3 hours for $51 at $17/hour.  She agrees that she would consider having ADTS staff if needed in her home to assist her at small intervals.  Pt confirms she did receive her delivery of potassium and namenda from [place] and able to place the medicines in her pill box without problems.     Plan: will return a call to Jasmine Bridges next week after CM has heard from ADTS staff.

## 2016-10-14 ENCOUNTER — Telehealth: Payer: Self-pay | Admitting: *Deleted

## 2016-10-16 ENCOUNTER — Other Ambulatory Visit: Payer: Self-pay | Admitting: *Deleted

## 2016-10-16 NOTE — Patient Outreach (Signed)
[facility]) Care Management  10/16/2016  DURENE DODGE [birth year] [id number]   Home services Resources  [facility] CM received a call from Tora Perches recently about ADTS services for Mrs Petrey  She reviewed Mrs Waltermire referral needs with [facility] CM, CM shared concerns voiced by Mrs Sak related to services(cost, frequency, needs) and Belenda Cruise reviewed the cost and minimal requirements ($17 per hour with minimal of 2 hours every week, no prn services ) of services. Agree to meet Mrs Mcmiller on 10/22/16 at 2 pm   Plan Updated Mrs Sen on call from ADTS and complete a home visit to Mrs Alfred with ADTS on 10/22/16 if pt continues to be agreeable to services

## 2016-10-16 NOTE — Patient Outreach (Signed)
[facility]) Care Management  10/16/2016  Jasmine Bridges 1932/10/09 [id number]     Personal care services [facility] Bridges called Jasmine. Bridges to updated her on call from Tora Perches and ADTS service minimal requirements for $17/hr, minimal of 2 hrs/day and minimal of 1 day per week.  Discussed scheduled appointment on 10/22/16 at 2 pm.  Jasmine Bridges stated "Um I think I will hold off on that" When [facility] Bridges inquired for clarity, Jasmine Bridges confirmed she wanted to decline the October 22 2016 2  to have an al ssessment from ADTS staff.  Jasmine Bridges requested [facility] Bridges to cancel the scheduled appointment.  Again [facility] Bridges inquired about family and friends assistance.  Jasmine Bridges informed Bridges she has a grandson (millicent's son) "but he is a workaholic" and in regards to [employer], " she would not come". THN Bridges discussed local maid services, located [employer] in [place] and called to leave a message for [employer] staff to call [facility] Bridges or pt with cost information.  Left Jasmine Bridges and [facility] Bridges contact numbers.  Pt voiced appreciation for [facility] Bridges assistance in attempting to find her local resource services.    Jasmine Bridges has reported to have reviewed her pill box this week and is able to tell Jasmine Bridges that she would be calling her pharmacy for delivery of medications soon.    Plans:  To follow up with Jasmine Bridges in the next week

## 2016-11-12 ENCOUNTER — Other Ambulatory Visit: Payer: Self-pay | Admitting: *Deleted
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
## 2016-11-13 ENCOUNTER — Other Ambulatory Visit: Payer: Self-pay | Admitting: *Deleted

## 2016-11-13 NOTE — Patient Outreach (Addendum)
[employer]) Care Management   11/13/2016  AMAN BATLEY Aug 05, 1932 [id number]  Jasmine Bridges is an 83 y.o. female living alone in [place]. She has medical history which includes diabetes mellitus type II, Gastroesophageal reflux disease, hyperlipidemia, hypertension, mitral insufficiency, mononeuropathy, gout, Chronic kidney disease 2, Vit. D deficiency, Vit B 12 deficiency anemia, generalizedanxiety disorder, dizziness &giddiness, localized osteoporosis, arthritis, and is s/p total hip replacement and back fusion.  Subjective:  "I want to get someone to clean my house but I can't afford to pay someone the [place] you said they want" [facility] CM encouraged Mrs Lichtenberg to inquire of her Doristine Bosworth if there is an individual who may be able to assist her from her church.    Objective:   BP 130/74 (BP Location: Left Arm, Patient Position: Sitting, Cuff Size: Normal)   Pulse 64   Resp 20   SpO2 97%    Review of Systems  Constitutional: Positive for malaise/fatigue. Negative for chills, diaphoresis, fever and weight loss.  HENT: Negative.  Negative for congestion, ear discharge, ear pain, hearing loss, nosebleeds, sinus pain, sore throat and tinnitus.   Eyes: Negative.  Negative for blurred vision, double vision, photophobia, pain, discharge and redness.  Respiratory: Positive for shortness of breath. Negative for cough, hemoptysis, sputum production, wheezing and stridor.   Cardiovascular: Positive for leg swelling. Negative for chest pain, palpitations, orthopnea, claudication and PND.  Gastrointestinal: Positive for constipation. Negative for abdominal pain, blood in stool, diarrhea, heartburn, melena, nausea and vomiting.       Take an over the counter stool softener LBM 11/12/16  Genitourinary: Positive for urgency. Negative for dysuria, flank pain, frequency and hematuria.  Musculoskeletal: Negative for back pain, joint pain, myalgias and neck pain.  Skin: Negative.  Negative  for itching and rash.  Neurological: Positive for dizziness and headaches. Negative for tingling, tremors, sensory change, speech change, focal weakness, seizures, loss of consciousness and weakness.       Headaches and dizziness but takes her medicines and go away voices all her medicines has side effects of dizziness  Endo/Heme/Allergies: Negative for environmental allergies and polydipsia. Does not bruise/bleed easily.  Psychiatric/Behavioral: Positive for memory loss. Negative for depression, hallucinations, substance abuse and suicidal ideas. The patient is not nervous/anxious and does not have insomnia.     Physical Exam  Constitutional: She is oriented to person, place, and time. She appears well-developed and well-nourished.  HENT:  Head: Normocephalic and atraumatic.  Eyes: Conjunctivae and EOM are normal. Pupils are equal, round, and reactive to light.  Cardiovascular: Normal rate, regular rhythm, normal heart sounds and intact distal pulses.   Respiratory: Effort normal and breath sounds normal.  GI: Soft. Bowel sounds are normal.  Musculoskeletal: Normal range of motion.  Neurological: She is alert and oriented to person, place, and time.  Skin: Skin is warm and dry.  Psychiatric: She has a normal mood and affect. Her behavior is normal. Judgment and thought content normal.    Encounter Medications:   Outpatient Encounter Prescriptions as of 11/13/2016  Medication Sig Note  . allopurinol (ZYLOPRIM) 100 MG tablet Take 100 mg by mouth daily. Reported on 09/04/2015 10/06/2016: Needs refill; can't remember if refill was called in; RNCM with [facility] called [facility] for refill request  . ALPRAZolam (XANAX) 0.25 MG tablet Take 0.25 mg by mouth at bedtime as needed for anxiety. Reported on 09/04/2015 09/08/2016: On hand; has refill  . Besifloxacin HCl (BESIVANCE) 0.6 % SUSP Apply to eye. 09/08/2016:  completed  . Bromfenac Sodium (BROMDAY) 0.09 % SOLN Apply to eye. 09/08/2016: completed   . gabapentin (NEURONTIN) 300 MG capsule Take 300 mg by mouth 2 (two) times daily. Reported on 09/04/2015   . meclizine (ANTIVERT) 25 MG tablet Take 25 mg by mouth 3 (three) times daily as needed for dizziness. 09/08/2016: On hand; has not taken "in a while"  . memantine (NAMENDA XR) 14 MG CP24 24 hr capsule Take 14 mg by mouth.   . Memantine HCl ER (NAMENDA XR TITRATION PACK) 7 & 14 & 21 &28 MG CP24 Take by mouth. 09/24/2016: Completed; now taking '14mg'$  qhs  . nystatin cream (MYCOSTATIN) APPLY TO AFFECTD AREA TWICE DAILY. 09/08/2016: Has on hand; uses prn  . POTASSIUM CHLORIDE ER PO Take 10 mEq by mouth daily. Reported on 09/04/2015 09/11/2016: Assisted with refill call in  . spironolactone (ALDACTONE) 25 MG tablet Take 25 mg by mouth daily. Reported on 09/04/2015   . torsemide (DEMADEX) 10 MG tablet Take 10 mg by mouth daily. Reported on 09/04/2015 09/08/2016: On hand; no refill since 11/2015; takes "when I see swelling"  . traMADol (ULTRAM) 50 MG tablet Take by mouth every 6 (six) hours as needed. Reported on 09/04/2015   . triamcinolone (KENALOG) 0.025 % ointment APPLY TO AFFECTED AREA TWICE DAILY. (Patient not taking: Reported on 09/24/2016) 09/24/2016: On hand   No facility-administered encounter medications on file as of 11/13/2016.     Functional Status:   In your present state of health, do you have any difficulty performing the following activities: 09/08/2016 08/29/2016  Hearing? - N  Vision? - N  Difficulty concentrating or making decisions? Tempie Donning  Walking or climbing stairs? (No Data) N  Dressing or bathing? - N  Doing errands, shopping? Tempie Donning  Preparing Food and eating ? N N  Using the Toilet? - N  In the past six months, have you accidently leaked urine? - N  Do you have problems with loss of bowel control? - N  Managing your Medications? Y Y  Managing your Finances? - Y  Housekeeping or managing your Housekeeping? Tempie Donning  Some recent data might be hidden    Fall/Depression Screening:    PHQ 2/9  Scores 08/28/2016  PHQ - 2 Score 0    Assessment:   83 year old female patient living in [place] alone in her home. She has a daughter locally and a daughter who lives in the [place] area and keeps close contact by phone. Patient had been showing signs of worsening memory which has affecting medication management. She has becoming somewhat socially isolated and also has some difficulty with home management after her son passed a few years ago.    Medication Management- While THN CM was present with Mrs Holster she called her pharmacy to call in 3 medications Allopurinol, namenda and spironolactone) for delivery  Mrs. Whittenburg states she can not tell if her Namenda 14 mg is helping her. Reports having some "woozy" episodes which she has reported since staring namenda.  Mrs Batchelder then states "most of my medications have dizziness as side effects.  THN CM mentioned having Dr Nevada Crane or her pharmacy check her medications for side effects of dizziness.and blister/bubble pack medication delivery option but Mrs Silberman states she prefers to continue to use her weekly pill box.  Mrs Prices mentioned leg swelling at intervals with use of Torsemide to decrease this.  Minimal edema noted of ankles during home visit.  Mrs Dejoy mentions sob with  walking throughout her home related to her decrease in activity after various surgeries to her back, hips etc.    Memory- Mrs Soileau called [facility] CM by her name in the middle of the visit Mrs. Burgio would benefit from the involvement of a family member or friend for oversight of medication management. She likes her pill boxes but I am not convinced that she could fill them herself each week. I asked her if she thought one of her family members would be willing to become involved. Her daughter [name] liked the idea of the pill boxes and felt her sister Fonnie Birkenhead who lives locally might be able to help. I called [name] and left a HIPPA compliant voice message requesting a return  call so we could discuss this idea further.   Level of Care Concerns- Mrs continues to do well in her home living alone without falls and increased incidences with her medications  Mrs. Rountree's income exceeds limits to allow her to qualify for Medicaid which would afford her a limited level of in home care at no cost to her. THN CMs have discussed private pay services through organizations in [facility] like ADTS and she denied ability to pay for ADTS services once [facility] CM assisted with initiating a ADTS home visit.  Mrs Garate's daughter who lives locally states she had assisted with ADTS also and had someone come to clean Mrs Leathers's home but she refused ADTS and home became cluttered again after each cleaning from family and other hired help. Mrs. Fortune is not interested in moving from her home and wishes to remain there and be as independent as possible for as long as possible.   Advanced Directives-  Mrs. Debes was provided with Advanced Directives materials and reviewed it with her on my first visit, but she continues not to have any questions about advanced directives.    THN CM found on Mrs Miske's kitchen table her bottles of medications, her weekly pill box Mrs Vannatter found her [facility] Calendar near her tv. She stated she "glance through all of theses yesterday" She reports she realize that the Calendar has resources that she could benefit from but most required her to have medicaid and she know she would have to fill out applications that may ask her to recall information "and this makes me nervous."  Plan:  [facility] CM encouraged Mrs Mcglade to inquire of her Doristine Bosworth if there is an individual who may be able to assist her from her church. THN CM discussed with Mrs Vasallo that [place] had spoken with her daughters, [name] and [name] after speaking with her on this week about her voiced concerns with memory, medication management, and home management.  Her daughters stated they would be contacting  Mrs Olivero and will continue to monitor with telephone calls and visits to help her maintain safety in the home. THN CM offered counseling community resources to Mrs Bugbee especially after she discussed her sadness over the cardiac deaths of female family members (her father, son's father, and her son plus the death of her female infant.  Mrs Rizo facial expression went from smiling to blank as she talked about the death of her two sons. Initially when asked about her son who died recently she avoided talking about him and spoke about her father instead.  When she spoke of her son it was minutes later and brief.  At the end of the conversation Mrs Diekmann inquire if she would benefit from community  counseling resources and [facility] CM informed her counseling services would be beneficial. Mrs Rogalski agree to have resources sent to her in the mail to review.  THN CM will collaborate with [facility] for counseling community resources (mail) and Pcp, Dr Margo Aye for further need of THN CM services  [facility] CM Care Plan Problem One     Most Recent Value  Care Plan Problem One  Medication Adherence/Management needs  Role Documenting the Problem One  Care Management Coordinator  Care Plan for Problem One  Active  THN Long Term Goal (31-90 days)  Over the next 60 days, patient will verbalize understanding of plan institued to promote medication adherence  THN Long Term Goal Start Date  08/28/16  [facility] Long Term Goal Met Date  11/13/16  Interventions for Problem One Long Term Goal  Medications reviewed by phone,  discussed options for adherence with patient,  planned home/ face to face visit  THN CM Short Term Goal #1 (0-30 days)  Over the next 14 days, patient will meet with RNCM to review medicaitons and devise plan for adherence  THN CM Short Term Goal #1 Start Date  08/28/16  [facility] CM Short Term Goal #1 Met Date  09/08/16  Interventions for Short Term Goal #1  meet with [facility]  THN CM Short Term Goal #2 (0-30 days)  Over the next  30 days, patient will take medications as prescribed as evidenced by pill count/medication review by [facility]  THN CM Short Term Goal #2 Start Date  08/28/16  [facility] CM Short Term Goal #2 Met Date  09/24/16  Interventions for Short Term Goal #2  Medications reviewed,  scheduled home visit  THN CM Short Term Goal #3 (0-30 days)  Over the next 30 days, patient and family will work together to devise a plan for long term assistance for medication management/filling pill boxes  THN CM Short Term Goal #3 Start Date  09/24/16  [facility] CM Short Term Goal #3 Met Date  11/13/16  Interventions for Short Tern Goal #3  Outreach to pharmacy re: medication refill needs    [facility] CM Care Plan Problem Two     Most Recent Value  Care Plan Problem Two  Fall Risk/Home Safety Concerns  Role Documenting the Problem Two  Care Management Coordinator  Care Plan for Problem Two  Not Active  Interventions for Problem Two Long Term Goal   Discussed fall risk/home safety concerns with patient,  planned home visit for full fall assessment and establishement of plan of care  THN Long Term Goal (31-90) days  Over the next 31 days, patient will verbalize understanding of plan to minimize home safety/fall risk concerns  THN Long Term Goal Start Date  08/28/16  THN Long Term Goal Met Date  09/24/16  THN CM Short Term Goal #1 (0-30 days)  Over the next 14 days, patient will participate in fall risk assessment in person with RNCM  THN CM Short Term Goal #1 Start Date  08/28/16  [facility] CM Short Term Goal #1 Met Date   09/08/16  Interventions for Short Term Goal #2   complete fall risk assessment    THN CM Care Plan Problem Three     Most Recent Value  Care Plan Problem Three  Level of care concerns  Role Documenting the Problem Three  Care Management Coordinator  Care Plan for Problem Three  Active  THN Long Term Goal (31-90) days  Over the next 31 days, patient will verbalize understanding  of plan of care to address level of care concerns   [facility] Long Term Goal Start Date  09/24/16  [facility] Long Term Goal Met Date  10/06/16  Interventions for Problem Three Long Term Goal  discussed level of care needs with patient,  scheduled home visit to discuss in detail  THN CM Short Term Goal #1 (0-30 days)  Over the next 30 days, patient will consider options for in home care assitance  [facility] CM Short Term Goal #1 Start Date  09/08/16  [facility] CM Short Term Goal #1 Met Date  10/06/16  Interventions for Short Term Goal #1  discussed with patient options for in home care assistance,  reached out to Kimberly for referral  [facility] CM Short Term Goal #2 (0-30 days)  Over the next 30 days, patient will discuss long term planning with her daughters, to include Advanced Directives and long term care options   THN CM Short Term Goal #2 Start Date  09/08/16  [facility] CM Short Term Goal #2 Met Date  11/13/16  Interventions for Short Term Goal #2  Discussed Advanced Directives with patient and provided AD Packet,  discussed care options and need to discuss with children       Kimberly L. Lavina Hamman, RN, BSN, [place] Care Management [phone]

## 2016-11-28 DIAGNOSIS — I10 Essential (primary) hypertension: Secondary | ICD-10-CM | POA: Diagnosis not present

## 2016-11-28 DIAGNOSIS — E782 Mixed hyperlipidemia: Secondary | ICD-10-CM | POA: Diagnosis not present

## 2016-11-28 DIAGNOSIS — E559 Vitamin D deficiency, unspecified: Secondary | ICD-10-CM | POA: Diagnosis not present

## 2016-11-28 DIAGNOSIS — E1122 Type 2 diabetes mellitus with diabetic chronic kidney disease: Secondary | ICD-10-CM | POA: Diagnosis not present

## 2016-12-02 DIAGNOSIS — R0989 Other specified symptoms and signs involving the circulatory and respiratory systems: Secondary | ICD-10-CM | POA: Diagnosis not present

## 2016-12-02 DIAGNOSIS — G3184 Mild cognitive impairment, so stated: Secondary | ICD-10-CM | POA: Diagnosis not present

## 2016-12-02 DIAGNOSIS — G8929 Other chronic pain: Secondary | ICD-10-CM | POA: Diagnosis not present

## 2016-12-02 DIAGNOSIS — E782 Mixed hyperlipidemia: Secondary | ICD-10-CM | POA: Diagnosis not present

## 2016-12-02 DIAGNOSIS — E1122 Type 2 diabetes mellitus with diabetic chronic kidney disease: Secondary | ICD-10-CM | POA: Diagnosis not present

## 2016-12-02 DIAGNOSIS — I10 Essential (primary) hypertension: Secondary | ICD-10-CM | POA: Diagnosis not present

## 2016-12-02 DIAGNOSIS — M1000 Idiopathic gout, unspecified site: Secondary | ICD-10-CM | POA: Diagnosis not present

## 2016-12-02 DIAGNOSIS — G589 Mononeuropathy, unspecified: Secondary | ICD-10-CM | POA: Diagnosis not present

## 2016-12-02 DIAGNOSIS — N182 Chronic kidney disease, stage 2 (mild): Secondary | ICD-10-CM | POA: Diagnosis not present

## 2016-12-30 ENCOUNTER — Other Ambulatory Visit: Payer: Self-pay | Admitting: *Deleted

## 2016-12-30 NOTE — Patient Outreach (Signed)
[facility]) Care Management  12/30/2016  KRITHI BRAY Sep 11, 1932 [id number]   [facility] CM called Mrs Snavely on 12/29/16 at 1609 to follow up with her. She initially picked up the phone and disconnected but upon [facility] CM return call she reports she is doing well with taking her medications but had questions about understanding her recent Dec 02 2016 lab results especially her BUN, Creatinine, and cholesterol labs.  She was noted having difficulty saying the word creatinine.  She reports elevation in creatinine "for a while now" and that the staff had "wrote notes on the sheet for me"  When asked to read the notes and values she had delay in responses.   THN CM reviewed with Mrs Richeson the normal ranges of the BUN, Creatinine and discussed what it meant when each was low and what it meant when each was high. Each one of her values reported to Cm were elevated by 1-2 values. THN CM discussed dehydration and kidney issues.  THN CM referred her to the pcp to get clarity on the notes written on the sheet for her since she voiced concern about the values and the notes. THN CM had spoke with [name] at Dr Nevada Crane office to see when her last pcp appt had been and when her next follow appt was,  [name] confirmed Mrs Husby did attend a Dec 02 2016 appt and Dr Nevada Crane has requested she return in 6 months on 06/04/17-06/05/17.  Mrs Delgadillo confirmed these appointment dates   [facility] CM had to answer another call and had to disconnect from Mrs Whaling  [facility] CM called Dr Nevada Crane office on 12/30/16 1453 to attempt to get a copy of Mrs Haggar labs so that CM could provide more education as needed and on 12/29/16 1150  to see if there were other needs identified during the May 2018 pcp OV visit that Dr Nevada Crane wanted [facility] CM to follow Mrs Estelle for  Plans Pending a call from Dr Nevada Crane office Routed note to [place]. Lavina Hamman, RN, BSN, [place] Care Management [phone]

## 2017-02-19 ENCOUNTER — Other Ambulatory Visit: Payer: Self-pay | Admitting: *Deleted

## 2017-02-24 ENCOUNTER — Telehealth: Payer: Self-pay | Admitting: *Deleted

## 2017-02-24 NOTE — Patient Outreach (Signed)
[facility]) Care Management  02/19/2017  LILAH MIJANGOS 12/01/1932 [id number]   Care coordination [facility] CM called by Mrs Luckman  Mrs Llorens inquired about a GYN issue Reports to [facility] CM that "I looked down there today and it was black.  I am so embarrass and need to see someone" She explains she has seen a provider in [place] in which she can not recall the name of.  She reports use of creams before to resolve the issue  When [facility] CM inquired she can not recall the last time she had seen an OB GYN.  Voiced interest in seeing someone locally Reported last OB GYN "young and did not know what he was doing"  Plans to assist with a list of in network ob gyn providers for follow up care  Calel Pisarski L. Lavina Hamman, RN, BSN, [place] Care Management [phone]

## 2017-02-24 NOTE — Patient Outreach (Signed)
[facility]) Care Management  02/24/2017  Jasmine Bridges 23-Nov-1932 [id number]   Care coordination  [facility] CM spoke with Jasmine Bridges to confirm she still is active with [employer] gold plus HMO coverage (no updated [employer] card in [facility]- coverage on present card expires in November 2018) In network provider search indicates OB GYN providers listed in [place] as [facility] and [facility].   Jasmine Bridges confirms she has previously been seen by Jasmine Brid[facility] at [facility] but feels "He is too young and is use to seeing only the younger females"   She reports seeing a "Jasmine Bridges in [place]" but could not recall "what type of Jasmine he is"  With a goggle search Jasmine Bridges found to be an urologist and Jasmine Bridges was informed. Jasmine Bridges recalls Jasmine Bridges prescribing her a "cream that Jasmine Bridges has re ordered at intervals"  She has also discussed her issue with Jasmine Bridges but states she is generally referred to a specialist by pcp Preference is not to go out of [place] for [facility] GYN services and to be seen by another provider other than Jasmine Bridges for questionable fungal infection of vaginal area as described by Jasmine Bridges.   Plans: [facility] CM assessed Jasmine Bridges for updates in home care/nutrition/medications Next pcp appointments are scheduled on November 7 & 9 2018 [facility] CM called and confirmed with Jasmine Bridges staff at [phone] there is not an OB GYN department nor providers available [facility] CM spoke with Jasmine Bridges at [facility] [phone] who answered questions for [facility] CM related to female providers at the office and was able to provide Jasmine Bridges with a new patient appointment for 10:15 am on March 05, 2017 with Wells Guiles, CNM, [facility] Updated Jasmine Bridges on her scheduled appointment that she voiced being very pleased with and also provided her with a list of [facility] in network urologists vs [place] after she mentioned she had bladder issues also she  wanted to talk to the urologist about and she wanted to consult Jasmine Bridges about referral to an urologist.  She wrote down the in network provider information. THN Cm educated her on the differences in OB GYN and urology providers.    AmeLie Hollars L. Lavina Hamman, RN, BSN, [place] Care Management [phone]

## 2017-03-05 ENCOUNTER — Encounter: Payer: Commercial Managed Care - HMO | Admitting: Women's Health

## 2017-03-17 ENCOUNTER — Other Ambulatory Visit: Payer: Self-pay | Admitting: *Deleted

## 2017-03-17 NOTE — Patient Outreach (Signed)
  [facility]) Care Management  03/17/2017  Jasmine Bridges [birth year] [id number]   Care coordination THN Cm followed up to see how OB GYN appointment went  No answer Left HIPP compliant voice message including THN CM mobile number for a return call  Plan [facility] CM will attempt to reach [name] again in the next 2-4 weeks and close case if no further issues assessed  Kimberly L. Lavina Hamman, RN, BSN, [place] Care Management [phone]

## 2017-04-21 ENCOUNTER — Telehealth: Payer: Self-pay | Admitting: *Deleted

## 2017-04-21 NOTE — Patient Outreach (Signed)
[facility]) Care Management  04/21/2017  Jasmine Bridges 05-Nov-1932 [id number]   Care coordination  [facility] CM called and spoke with Jasmine Bridges  She informed THN CM she did not go to the [facility] GYN appointment [facility] CM assisted her to get but her symptoms are "better" "I cancelled the appointment" [facility] CM discussed a warm transfer/referral to [facility] health coach services and Jasmine Bridges agreed and thanked [facility] CM for contacting her  Jasmine Bridges has met her [facility] CM goals related to medications and community resources   Plans to send referral to [place] coach Route to pcp and other providers listed as care team members in [place] Problem One     Most Recent Value  Care Plan Problem One  Medication Adherence/Management needs  Role Documenting the Problem One  Care Management Coordinator  Care Plan for Problem One  Active  THN Long Term Goal   Over the next 60 days, patient will verbalize understanding of plan institued to promote medication adherence  THN Long Term Goal Start Date  08/28/16  [facility] Long Term Goal Met Date  11/13/16  Interventions for Problem One Long Term Goal  Medications reviewed by phone,  discussed options for adherence with patient,  planned home/ face to face visit  THN CM Short Term Goal #1   Over the next 14 days, patient will meet with RNCM to review medicaitons and devise plan for adherence  THN CM Short Term Goal #1 Start Date  08/28/16  [facility] CM Short Term Goal #1 Met Date  09/08/16  Interventions for Short Term Goal #1  meet with [facility]  THN CM Short Term Goal #2   Over the next 30 days, patient will take medications as prescribed as evidenced by pill count/medication review by [facility]  THN CM Short Term Goal #2 Start Date  08/28/16  [facility] CM Short Term Goal #2 Met Date  09/24/16  Interventions for Short Term Goal #2  Medications reviewed,  scheduled home visit  THN CM Short Term Goal #3  Over the next 30 days, patient and family will work together to  devise a plan for long term assistance for medication management/filling pill boxes  THN CM Short Term Goal #3 Start Date  09/24/16  [facility] CM Short Term Goal #3 Met Date  11/13/16  Interventions for Short [place] Goal #3  Outreach to pharmacy re: medication refill needs    [name] Care Plan Problem Two     Most Recent Value  Care Plan Problem Two  Fall Risk/Home Safety Concerns  Role Documenting the Problem Two  Care Management Coordinator  Care Plan for Problem Two  Not Active  Interventions for Problem Two Long Term Goal   Discussed fall risk/home safety concerns with patient,  planned home visit for full fall assessment and establishement of plan of care  [facility] Long Term Goal  Over the next 31 days, patient will verbalize understanding of plan to minimize home safety/fall risk concerns  THN Long Term Goal Start Date  08/28/16  [facility] Long Term Goal Met Date  09/24/16  [facility] CM Short Term Goal #1   Over the next 14 days, patient will participate in fall risk assessment in person with [facility]  [facility] CM Short Term Goal #1 Start Date  08/28/16  [facility] CM Short Term Goal #1 Met Date   09/08/16  Interventions for Short Term Goal #2   complete fall risk assessment  THN CM Care Plan Problem Three     Most Recent Value  Care Plan Problem Three  Level of care concerns  Role Documenting the Problem Three  Care Management Coordinator  Care Plan for Problem Three  Active  THN Long Term Goal   Over the next 31 days, patient will verbalize understanding of plan of care to address level of care concerns  THN Long Term Goal Start Date  09/24/16  [facility] Long Term Goal Met Date  10/06/16  Interventions for Problem Three Long Term Goal  discussed level of care needs with patient,  scheduled home visit to discuss in detail  [facility] CM Short Term Goal #1   Over the next 30 days, patient will consider options for in home care assitance  [facility] CM Short Term Goal #1 Start Date  09/08/16  [facility] CM Short Term Goal #1 Met Date  10/06/16   Interventions for Short Term Goal #1  discussed with patient options for in home care assistance,  reached out to [place] for referral  [facility] CM Short Term Goal #2   Over the next 30 days, patient will discuss long term planning with her daughters, to include Advanced Directives and long term care options   THN CM Short Term Goal #2 Start Date  09/08/16  [facility] CM Short Term Goal #2 Met Date  11/13/16  Interventions for Short Term Goal #2  Discussed Advanced Directives with patient and provided AD Packet,  discussed care options and need to discuss with children       Jasmine Bridges L. Lavina Hamman, RN, BSN, [place] Care Management [phone]

## 2017-04-22 ENCOUNTER — Telehealth: Payer: Self-pay | Admitting: *Deleted

## 2017-04-22 ENCOUNTER — Other Ambulatory Visit: Payer: Self-pay

## 2017-04-22 ENCOUNTER — Other Ambulatory Visit: Payer: Self-pay | Admitting: *Deleted

## 2017-04-22 NOTE — Telephone Encounter (Signed)
This encounter was created in error - please disregard.

## 2017-04-22 NOTE — Patient Outreach (Signed)
[place]) Care Management  04/22/2017  Jasmine Bridges Apr 15, 1933 [id number]   [place] spoke with the patient and HIPPA verified.  RN Health Coach conversed with the patient about diabetes and she states that she has pre diabetes and currently not taking any medications.  She states that she does not check her blood sugars. RN Health Coach asked the patient has her physician stated that he wants her to check her blood sugar and a range that she needed to be in and the patient states "no".  No interventions at this time are needed for the patient. RN Health Coach called and spoke with [name] RN to discuss my findings.  [name] will close the patient out and if she has need for education in the future she will refer her.  Lazaro Arms RN, BSN, [place] Direct Dial:  [phone] Fax: [phone]

## 2017-04-22 NOTE — Patient Outreach (Signed)
[place] [facility]) Care Management  04/22/2017  HELIA HAESE [birth year] [id number]   Care coordination   THN CM and [facility] health coach Traci collaborated after she spoke with Mrs Silberstein today Refer to [place] coach note [facility] CM to close case for reason: Consumer successfully achieved goal and Consumer assessed no further intervention needed (by health coach)  Plans case closure, route note to pcp and closure letters to pcp and patient Update THN [place]. Lavina Hamman, RN, BSN, [place] Care Management [phone]

## 2017-04-22 NOTE — Patient Outreach (Signed)
[name]) Care Management  04/22/2017  Jasmine Bridges 03-18-1933 [id number]   [place] spoke with the patient and HIPPA verified.  RN Health Coach talked with the patient about diabetes.  The patient stated that she is a pre diabetic and does not take diabetic medication nor does she check her blood sugars.  RN Health Coach review the patient' chart and there is no DM medication in her medication list.   There is no intervention needed at this time.  RN Health Coach called and talked with [name] RN and discussed my findings.  [name] will close the patient's case.   Lazaro Arms RN, BSN, [place] Direct Dial:  [phone] Fax: [phone]

## 2017-06-03 DIAGNOSIS — I10 Essential (primary) hypertension: Secondary | ICD-10-CM | POA: Diagnosis not present

## 2017-06-03 DIAGNOSIS — E1122 Type 2 diabetes mellitus with diabetic chronic kidney disease: Secondary | ICD-10-CM | POA: Diagnosis not present

## 2017-06-03 DIAGNOSIS — E782 Mixed hyperlipidemia: Secondary | ICD-10-CM | POA: Diagnosis not present

## 2017-06-05 DIAGNOSIS — M545 Low back pain: Secondary | ICD-10-CM | POA: Diagnosis not present

## 2017-06-05 DIAGNOSIS — G3184 Mild cognitive impairment, so stated: Secondary | ICD-10-CM | POA: Diagnosis not present

## 2017-06-05 DIAGNOSIS — E1140 Type 2 diabetes mellitus with diabetic neuropathy, unspecified: Secondary | ICD-10-CM | POA: Diagnosis not present

## 2017-06-05 DIAGNOSIS — N182 Chronic kidney disease, stage 2 (mild): Secondary | ICD-10-CM | POA: Diagnosis not present

## 2017-06-05 DIAGNOSIS — M1000 Idiopathic gout, unspecified site: Secondary | ICD-10-CM | POA: Diagnosis not present

## 2017-06-05 DIAGNOSIS — I6523 Occlusion and stenosis of bilateral carotid arteries: Secondary | ICD-10-CM | POA: Diagnosis not present

## 2017-06-05 DIAGNOSIS — M25559 Pain in unspecified hip: Secondary | ICD-10-CM | POA: Diagnosis not present

## 2017-06-05 DIAGNOSIS — Z23 Encounter for immunization: Secondary | ICD-10-CM | POA: Diagnosis not present

## 2017-06-05 DIAGNOSIS — E785 Hyperlipidemia, unspecified: Secondary | ICD-10-CM | POA: Diagnosis not present

## 2017-06-05 DIAGNOSIS — G9009 Other idiopathic peripheral autonomic neuropathy: Secondary | ICD-10-CM | POA: Diagnosis not present

## 2017-09-14 DIAGNOSIS — H9201 Otalgia, right ear: Secondary | ICD-10-CM | POA: Diagnosis not present

## 2017-09-14 DIAGNOSIS — Z6828 Body mass index (BMI) 28.0-28.9, adult: Secondary | ICD-10-CM | POA: Diagnosis not present

## 2017-09-14 DIAGNOSIS — H6121 Impacted cerumen, right ear: Secondary | ICD-10-CM | POA: Diagnosis not present

## 2017-11-10 DIAGNOSIS — M545 Low back pain: Secondary | ICD-10-CM | POA: Diagnosis not present

## 2017-11-10 DIAGNOSIS — M25559 Pain in unspecified hip: Secondary | ICD-10-CM | POA: Diagnosis not present

## 2017-11-10 DIAGNOSIS — G3184 Mild cognitive impairment, so stated: Secondary | ICD-10-CM | POA: Diagnosis not present

## 2017-11-10 DIAGNOSIS — E785 Hyperlipidemia, unspecified: Secondary | ICD-10-CM | POA: Diagnosis not present

## 2017-11-10 DIAGNOSIS — I6523 Occlusion and stenosis of bilateral carotid arteries: Secondary | ICD-10-CM | POA: Diagnosis not present

## 2017-11-10 DIAGNOSIS — N182 Chronic kidney disease, stage 2 (mild): Secondary | ICD-10-CM | POA: Diagnosis not present

## 2017-11-10 DIAGNOSIS — G9009 Other idiopathic peripheral autonomic neuropathy: Secondary | ICD-10-CM | POA: Diagnosis not present

## 2017-11-10 DIAGNOSIS — M1000 Idiopathic gout, unspecified site: Secondary | ICD-10-CM | POA: Diagnosis not present

## 2017-11-10 DIAGNOSIS — E1140 Type 2 diabetes mellitus with diabetic neuropathy, unspecified: Secondary | ICD-10-CM | POA: Diagnosis not present

## 2017-11-12 DIAGNOSIS — E1122 Type 2 diabetes mellitus with diabetic chronic kidney disease: Secondary | ICD-10-CM | POA: Diagnosis not present

## 2017-11-12 DIAGNOSIS — G8929 Other chronic pain: Secondary | ICD-10-CM | POA: Diagnosis not present

## 2017-11-12 DIAGNOSIS — N182 Chronic kidney disease, stage 2 (mild): Secondary | ICD-10-CM | POA: Diagnosis not present

## 2017-11-12 DIAGNOSIS — I10 Essential (primary) hypertension: Secondary | ICD-10-CM | POA: Diagnosis not present

## 2017-11-12 DIAGNOSIS — M109 Gout, unspecified: Secondary | ICD-10-CM | POA: Diagnosis not present

## 2017-11-12 DIAGNOSIS — E782 Mixed hyperlipidemia: Secondary | ICD-10-CM | POA: Diagnosis not present

## 2017-11-12 DIAGNOSIS — G3184 Mild cognitive impairment, so stated: Secondary | ICD-10-CM | POA: Diagnosis not present

## 2017-11-12 DIAGNOSIS — F5101 Primary insomnia: Secondary | ICD-10-CM | POA: Diagnosis not present

## 2017-12-01 DIAGNOSIS — Z6827 Body mass index (BMI) 27.0-27.9, adult: Secondary | ICD-10-CM | POA: Diagnosis not present

## 2017-12-01 DIAGNOSIS — I10 Essential (primary) hypertension: Secondary | ICD-10-CM | POA: Diagnosis not present

## 2017-12-01 DIAGNOSIS — R269 Unspecified abnormalities of gait and mobility: Secondary | ICD-10-CM | POA: Diagnosis not present

## 2017-12-01 DIAGNOSIS — F0390 Unspecified dementia without behavioral disturbance: Secondary | ICD-10-CM | POA: Diagnosis not present

## 2017-12-03 DIAGNOSIS — N182 Chronic kidney disease, stage 2 (mild): Secondary | ICD-10-CM | POA: Diagnosis not present

## 2017-12-03 DIAGNOSIS — F5101 Primary insomnia: Secondary | ICD-10-CM | POA: Diagnosis not present

## 2017-12-03 DIAGNOSIS — G8929 Other chronic pain: Secondary | ICD-10-CM | POA: Diagnosis not present

## 2017-12-03 DIAGNOSIS — E785 Hyperlipidemia, unspecified: Secondary | ICD-10-CM | POA: Diagnosis not present

## 2017-12-03 DIAGNOSIS — I6523 Occlusion and stenosis of bilateral carotid arteries: Secondary | ICD-10-CM | POA: Diagnosis not present

## 2017-12-03 DIAGNOSIS — I129 Hypertensive chronic kidney disease with stage 1 through stage 4 chronic kidney disease, or unspecified chronic kidney disease: Secondary | ICD-10-CM | POA: Diagnosis not present

## 2017-12-03 DIAGNOSIS — E1142 Type 2 diabetes mellitus with diabetic polyneuropathy: Secondary | ICD-10-CM | POA: Diagnosis not present

## 2017-12-03 DIAGNOSIS — M109 Gout, unspecified: Secondary | ICD-10-CM | POA: Diagnosis not present

## 2017-12-03 DIAGNOSIS — F0390 Unspecified dementia without behavioral disturbance: Secondary | ICD-10-CM | POA: Diagnosis not present

## 2017-12-03 DIAGNOSIS — E782 Mixed hyperlipidemia: Secondary | ICD-10-CM | POA: Diagnosis not present

## 2017-12-03 DIAGNOSIS — R260 Ataxic gait: Secondary | ICD-10-CM | POA: Diagnosis not present

## 2017-12-03 DIAGNOSIS — G9009 Other idiopathic peripheral autonomic neuropathy: Secondary | ICD-10-CM | POA: Diagnosis not present

## 2017-12-03 DIAGNOSIS — M1000 Idiopathic gout, unspecified site: Secondary | ICD-10-CM | POA: Diagnosis not present

## 2017-12-03 DIAGNOSIS — E1122 Type 2 diabetes mellitus with diabetic chronic kidney disease: Secondary | ICD-10-CM | POA: Diagnosis not present

## 2017-12-03 DIAGNOSIS — E1140 Type 2 diabetes mellitus with diabetic neuropathy, unspecified: Secondary | ICD-10-CM | POA: Diagnosis not present

## 2017-12-08 DIAGNOSIS — N182 Chronic kidney disease, stage 2 (mild): Secondary | ICD-10-CM | POA: Diagnosis not present

## 2017-12-08 DIAGNOSIS — R260 Ataxic gait: Secondary | ICD-10-CM | POA: Diagnosis not present

## 2017-12-08 DIAGNOSIS — E1122 Type 2 diabetes mellitus with diabetic chronic kidney disease: Secondary | ICD-10-CM | POA: Diagnosis not present

## 2017-12-08 DIAGNOSIS — I129 Hypertensive chronic kidney disease with stage 1 through stage 4 chronic kidney disease, or unspecified chronic kidney disease: Secondary | ICD-10-CM | POA: Diagnosis not present

## 2017-12-08 DIAGNOSIS — I6523 Occlusion and stenosis of bilateral carotid arteries: Secondary | ICD-10-CM | POA: Diagnosis not present

## 2017-12-08 DIAGNOSIS — E785 Hyperlipidemia, unspecified: Secondary | ICD-10-CM | POA: Diagnosis not present

## 2017-12-08 DIAGNOSIS — F0390 Unspecified dementia without behavioral disturbance: Secondary | ICD-10-CM | POA: Diagnosis not present

## 2017-12-08 DIAGNOSIS — E1142 Type 2 diabetes mellitus with diabetic polyneuropathy: Secondary | ICD-10-CM | POA: Diagnosis not present

## 2017-12-08 DIAGNOSIS — M1000 Idiopathic gout, unspecified site: Secondary | ICD-10-CM | POA: Diagnosis not present

## 2017-12-10 DIAGNOSIS — E1142 Type 2 diabetes mellitus with diabetic polyneuropathy: Secondary | ICD-10-CM | POA: Diagnosis not present

## 2017-12-10 DIAGNOSIS — R260 Ataxic gait: Secondary | ICD-10-CM | POA: Diagnosis not present

## 2017-12-10 DIAGNOSIS — F0390 Unspecified dementia without behavioral disturbance: Secondary | ICD-10-CM | POA: Diagnosis not present

## 2017-12-10 DIAGNOSIS — E1122 Type 2 diabetes mellitus with diabetic chronic kidney disease: Secondary | ICD-10-CM | POA: Diagnosis not present

## 2017-12-10 DIAGNOSIS — M1000 Idiopathic gout, unspecified site: Secondary | ICD-10-CM | POA: Diagnosis not present

## 2017-12-10 DIAGNOSIS — I6523 Occlusion and stenosis of bilateral carotid arteries: Secondary | ICD-10-CM | POA: Diagnosis not present

## 2017-12-10 DIAGNOSIS — N182 Chronic kidney disease, stage 2 (mild): Secondary | ICD-10-CM | POA: Diagnosis not present

## 2017-12-10 DIAGNOSIS — I129 Hypertensive chronic kidney disease with stage 1 through stage 4 chronic kidney disease, or unspecified chronic kidney disease: Secondary | ICD-10-CM | POA: Diagnosis not present

## 2017-12-10 DIAGNOSIS — E785 Hyperlipidemia, unspecified: Secondary | ICD-10-CM | POA: Diagnosis not present

## 2017-12-11 DIAGNOSIS — N182 Chronic kidney disease, stage 2 (mild): Secondary | ICD-10-CM | POA: Diagnosis not present

## 2017-12-11 DIAGNOSIS — M1000 Idiopathic gout, unspecified site: Secondary | ICD-10-CM | POA: Diagnosis not present

## 2017-12-11 DIAGNOSIS — E785 Hyperlipidemia, unspecified: Secondary | ICD-10-CM | POA: Diagnosis not present

## 2017-12-11 DIAGNOSIS — E1142 Type 2 diabetes mellitus with diabetic polyneuropathy: Secondary | ICD-10-CM | POA: Diagnosis not present

## 2017-12-11 DIAGNOSIS — I129 Hypertensive chronic kidney disease with stage 1 through stage 4 chronic kidney disease, or unspecified chronic kidney disease: Secondary | ICD-10-CM | POA: Diagnosis not present

## 2017-12-11 DIAGNOSIS — F0390 Unspecified dementia without behavioral disturbance: Secondary | ICD-10-CM | POA: Diagnosis not present

## 2017-12-11 DIAGNOSIS — I6523 Occlusion and stenosis of bilateral carotid arteries: Secondary | ICD-10-CM | POA: Diagnosis not present

## 2017-12-11 DIAGNOSIS — E1122 Type 2 diabetes mellitus with diabetic chronic kidney disease: Secondary | ICD-10-CM | POA: Diagnosis not present

## 2017-12-11 DIAGNOSIS — R260 Ataxic gait: Secondary | ICD-10-CM | POA: Diagnosis not present

## 2017-12-14 DIAGNOSIS — E1142 Type 2 diabetes mellitus with diabetic polyneuropathy: Secondary | ICD-10-CM | POA: Diagnosis not present

## 2017-12-14 DIAGNOSIS — E1122 Type 2 diabetes mellitus with diabetic chronic kidney disease: Secondary | ICD-10-CM | POA: Diagnosis not present

## 2017-12-14 DIAGNOSIS — N182 Chronic kidney disease, stage 2 (mild): Secondary | ICD-10-CM | POA: Diagnosis not present

## 2017-12-14 DIAGNOSIS — I129 Hypertensive chronic kidney disease with stage 1 through stage 4 chronic kidney disease, or unspecified chronic kidney disease: Secondary | ICD-10-CM | POA: Diagnosis not present

## 2017-12-14 DIAGNOSIS — R260 Ataxic gait: Secondary | ICD-10-CM | POA: Diagnosis not present

## 2017-12-14 DIAGNOSIS — E785 Hyperlipidemia, unspecified: Secondary | ICD-10-CM | POA: Diagnosis not present

## 2017-12-14 DIAGNOSIS — I6523 Occlusion and stenosis of bilateral carotid arteries: Secondary | ICD-10-CM | POA: Diagnosis not present

## 2017-12-14 DIAGNOSIS — M1000 Idiopathic gout, unspecified site: Secondary | ICD-10-CM | POA: Diagnosis not present

## 2017-12-14 DIAGNOSIS — F0390 Unspecified dementia without behavioral disturbance: Secondary | ICD-10-CM | POA: Diagnosis not present

## 2017-12-16 DIAGNOSIS — I129 Hypertensive chronic kidney disease with stage 1 through stage 4 chronic kidney disease, or unspecified chronic kidney disease: Secondary | ICD-10-CM | POA: Diagnosis not present

## 2017-12-16 DIAGNOSIS — I6523 Occlusion and stenosis of bilateral carotid arteries: Secondary | ICD-10-CM | POA: Diagnosis not present

## 2017-12-16 DIAGNOSIS — N182 Chronic kidney disease, stage 2 (mild): Secondary | ICD-10-CM | POA: Diagnosis not present

## 2017-12-16 DIAGNOSIS — F0390 Unspecified dementia without behavioral disturbance: Secondary | ICD-10-CM | POA: Diagnosis not present

## 2017-12-16 DIAGNOSIS — E1122 Type 2 diabetes mellitus with diabetic chronic kidney disease: Secondary | ICD-10-CM | POA: Diagnosis not present

## 2017-12-16 DIAGNOSIS — E1142 Type 2 diabetes mellitus with diabetic polyneuropathy: Secondary | ICD-10-CM | POA: Diagnosis not present

## 2017-12-16 DIAGNOSIS — M1000 Idiopathic gout, unspecified site: Secondary | ICD-10-CM | POA: Diagnosis not present

## 2017-12-16 DIAGNOSIS — R260 Ataxic gait: Secondary | ICD-10-CM | POA: Diagnosis not present

## 2017-12-16 DIAGNOSIS — E785 Hyperlipidemia, unspecified: Secondary | ICD-10-CM | POA: Diagnosis not present

## 2017-12-23 DIAGNOSIS — E1122 Type 2 diabetes mellitus with diabetic chronic kidney disease: Secondary | ICD-10-CM | POA: Diagnosis not present

## 2017-12-23 DIAGNOSIS — N182 Chronic kidney disease, stage 2 (mild): Secondary | ICD-10-CM | POA: Diagnosis not present

## 2017-12-23 DIAGNOSIS — M1000 Idiopathic gout, unspecified site: Secondary | ICD-10-CM | POA: Diagnosis not present

## 2017-12-23 DIAGNOSIS — I129 Hypertensive chronic kidney disease with stage 1 through stage 4 chronic kidney disease, or unspecified chronic kidney disease: Secondary | ICD-10-CM | POA: Diagnosis not present

## 2017-12-23 DIAGNOSIS — I6523 Occlusion and stenosis of bilateral carotid arteries: Secondary | ICD-10-CM | POA: Diagnosis not present

## 2017-12-23 DIAGNOSIS — E1142 Type 2 diabetes mellitus with diabetic polyneuropathy: Secondary | ICD-10-CM | POA: Diagnosis not present

## 2017-12-23 DIAGNOSIS — R260 Ataxic gait: Secondary | ICD-10-CM | POA: Diagnosis not present

## 2017-12-23 DIAGNOSIS — E785 Hyperlipidemia, unspecified: Secondary | ICD-10-CM | POA: Diagnosis not present

## 2017-12-23 DIAGNOSIS — F0390 Unspecified dementia without behavioral disturbance: Secondary | ICD-10-CM | POA: Diagnosis not present

## 2017-12-25 DIAGNOSIS — M1000 Idiopathic gout, unspecified site: Secondary | ICD-10-CM | POA: Diagnosis not present

## 2017-12-25 DIAGNOSIS — F0390 Unspecified dementia without behavioral disturbance: Secondary | ICD-10-CM | POA: Diagnosis not present

## 2017-12-25 DIAGNOSIS — R260 Ataxic gait: Secondary | ICD-10-CM | POA: Diagnosis not present

## 2017-12-25 DIAGNOSIS — E1122 Type 2 diabetes mellitus with diabetic chronic kidney disease: Secondary | ICD-10-CM | POA: Diagnosis not present

## 2017-12-25 DIAGNOSIS — E785 Hyperlipidemia, unspecified: Secondary | ICD-10-CM | POA: Diagnosis not present

## 2017-12-25 DIAGNOSIS — I6523 Occlusion and stenosis of bilateral carotid arteries: Secondary | ICD-10-CM | POA: Diagnosis not present

## 2017-12-25 DIAGNOSIS — N182 Chronic kidney disease, stage 2 (mild): Secondary | ICD-10-CM | POA: Diagnosis not present

## 2017-12-25 DIAGNOSIS — E1142 Type 2 diabetes mellitus with diabetic polyneuropathy: Secondary | ICD-10-CM | POA: Diagnosis not present

## 2017-12-25 DIAGNOSIS — I129 Hypertensive chronic kidney disease with stage 1 through stage 4 chronic kidney disease, or unspecified chronic kidney disease: Secondary | ICD-10-CM | POA: Diagnosis not present

## 2017-12-28 DIAGNOSIS — I6523 Occlusion and stenosis of bilateral carotid arteries: Secondary | ICD-10-CM | POA: Diagnosis not present

## 2017-12-28 DIAGNOSIS — I129 Hypertensive chronic kidney disease with stage 1 through stage 4 chronic kidney disease, or unspecified chronic kidney disease: Secondary | ICD-10-CM | POA: Diagnosis not present

## 2017-12-28 DIAGNOSIS — M1000 Idiopathic gout, unspecified site: Secondary | ICD-10-CM | POA: Diagnosis not present

## 2017-12-28 DIAGNOSIS — E1122 Type 2 diabetes mellitus with diabetic chronic kidney disease: Secondary | ICD-10-CM | POA: Diagnosis not present

## 2017-12-28 DIAGNOSIS — N182 Chronic kidney disease, stage 2 (mild): Secondary | ICD-10-CM | POA: Diagnosis not present

## 2017-12-28 DIAGNOSIS — R260 Ataxic gait: Secondary | ICD-10-CM | POA: Diagnosis not present

## 2017-12-28 DIAGNOSIS — F0390 Unspecified dementia without behavioral disturbance: Secondary | ICD-10-CM | POA: Diagnosis not present

## 2017-12-28 DIAGNOSIS — E1142 Type 2 diabetes mellitus with diabetic polyneuropathy: Secondary | ICD-10-CM | POA: Diagnosis not present

## 2017-12-28 DIAGNOSIS — E785 Hyperlipidemia, unspecified: Secondary | ICD-10-CM | POA: Diagnosis not present

## 2017-12-30 DIAGNOSIS — I129 Hypertensive chronic kidney disease with stage 1 through stage 4 chronic kidney disease, or unspecified chronic kidney disease: Secondary | ICD-10-CM | POA: Diagnosis not present

## 2017-12-30 DIAGNOSIS — E1142 Type 2 diabetes mellitus with diabetic polyneuropathy: Secondary | ICD-10-CM | POA: Diagnosis not present

## 2017-12-30 DIAGNOSIS — N182 Chronic kidney disease, stage 2 (mild): Secondary | ICD-10-CM | POA: Diagnosis not present

## 2017-12-30 DIAGNOSIS — I6523 Occlusion and stenosis of bilateral carotid arteries: Secondary | ICD-10-CM | POA: Diagnosis not present

## 2017-12-30 DIAGNOSIS — M1000 Idiopathic gout, unspecified site: Secondary | ICD-10-CM | POA: Diagnosis not present

## 2017-12-30 DIAGNOSIS — R260 Ataxic gait: Secondary | ICD-10-CM | POA: Diagnosis not present

## 2017-12-30 DIAGNOSIS — E785 Hyperlipidemia, unspecified: Secondary | ICD-10-CM | POA: Diagnosis not present

## 2017-12-30 DIAGNOSIS — F0390 Unspecified dementia without behavioral disturbance: Secondary | ICD-10-CM | POA: Diagnosis not present

## 2017-12-30 DIAGNOSIS — E1122 Type 2 diabetes mellitus with diabetic chronic kidney disease: Secondary | ICD-10-CM | POA: Diagnosis not present

## 2018-04-29 DIAGNOSIS — Z6826 Body mass index (BMI) 26.0-26.9, adult: Secondary | ICD-10-CM | POA: Diagnosis not present

## 2018-04-29 DIAGNOSIS — Z0000 Encounter for general adult medical examination without abnormal findings: Secondary | ICD-10-CM | POA: Diagnosis not present

## 2018-04-29 DIAGNOSIS — Z23 Encounter for immunization: Secondary | ICD-10-CM | POA: Diagnosis not present

## 2018-05-15 DIAGNOSIS — E1140 Type 2 diabetes mellitus with diabetic neuropathy, unspecified: Secondary | ICD-10-CM | POA: Diagnosis not present

## 2018-05-15 DIAGNOSIS — N182 Chronic kidney disease, stage 2 (mild): Secondary | ICD-10-CM | POA: Diagnosis not present

## 2018-05-15 DIAGNOSIS — F5101 Primary insomnia: Secondary | ICD-10-CM | POA: Diagnosis not present

## 2018-05-15 DIAGNOSIS — E782 Mixed hyperlipidemia: Secondary | ICD-10-CM | POA: Diagnosis not present

## 2018-05-15 DIAGNOSIS — M109 Gout, unspecified: Secondary | ICD-10-CM | POA: Diagnosis not present

## 2018-05-15 DIAGNOSIS — G8929 Other chronic pain: Secondary | ICD-10-CM | POA: Diagnosis not present

## 2018-05-15 DIAGNOSIS — M1000 Idiopathic gout, unspecified site: Secondary | ICD-10-CM | POA: Diagnosis not present

## 2018-05-15 DIAGNOSIS — G9009 Other idiopathic peripheral autonomic neuropathy: Secondary | ICD-10-CM | POA: Diagnosis not present

## 2018-05-15 DIAGNOSIS — E1122 Type 2 diabetes mellitus with diabetic chronic kidney disease: Secondary | ICD-10-CM | POA: Diagnosis not present

## 2018-05-18 DIAGNOSIS — M7918 Myalgia, other site: Secondary | ICD-10-CM | POA: Diagnosis not present

## 2018-05-18 DIAGNOSIS — E1122 Type 2 diabetes mellitus with diabetic chronic kidney disease: Secondary | ICD-10-CM | POA: Diagnosis not present

## 2018-05-18 DIAGNOSIS — I10 Essential (primary) hypertension: Secondary | ICD-10-CM | POA: Diagnosis not present

## 2018-05-18 DIAGNOSIS — M545 Low back pain: Secondary | ICD-10-CM | POA: Diagnosis not present

## 2018-05-18 DIAGNOSIS — N182 Chronic kidney disease, stage 2 (mild): Secondary | ICD-10-CM | POA: Diagnosis not present

## 2018-05-18 DIAGNOSIS — E782 Mixed hyperlipidemia: Secondary | ICD-10-CM | POA: Diagnosis not present

## 2018-05-18 DIAGNOSIS — G3184 Mild cognitive impairment, so stated: Secondary | ICD-10-CM | POA: Diagnosis not present

## 2018-05-18 DIAGNOSIS — M109 Gout, unspecified: Secondary | ICD-10-CM | POA: Diagnosis not present

## 2018-05-18 DIAGNOSIS — E1140 Type 2 diabetes mellitus with diabetic neuropathy, unspecified: Secondary | ICD-10-CM | POA: Diagnosis not present

## 2018-05-25 ENCOUNTER — Other Ambulatory Visit (HOSPITAL_COMMUNITY): Payer: Self-pay | Admitting: Internal Medicine

## 2018-05-25 DIAGNOSIS — R0989 Other specified symptoms and signs involving the circulatory and respiratory systems: Secondary | ICD-10-CM

## 2018-06-08 ENCOUNTER — Encounter (HOSPITAL_COMMUNITY): Payer: Self-pay

## 2018-06-08 ENCOUNTER — Ambulatory Visit (HOSPITAL_COMMUNITY)
Admission: RE | Admit: 2018-06-08 | Discharge: 2018-06-08 | Disposition: A | Discharge status: Home / Self Care | Payer: Medicare HMO | Source: Ambulatory Visit | Attending: Internal Medicine | Admitting: Internal Medicine

## 2018-06-21 DIAGNOSIS — Z961 Presence of intraocular lens: Secondary | ICD-10-CM | POA: Diagnosis not present

## 2018-06-21 DIAGNOSIS — H2512 Age-related nuclear cataract, left eye: Secondary | ICD-10-CM | POA: Diagnosis not present

## 2018-06-21 DIAGNOSIS — H35033 Hypertensive retinopathy, bilateral: Secondary | ICD-10-CM | POA: Diagnosis not present

## 2018-06-21 DIAGNOSIS — H25012 Cortical age-related cataract, left eye: Secondary | ICD-10-CM | POA: Diagnosis not present

## 2018-06-21 DIAGNOSIS — H26491 Other secondary cataract, right eye: Secondary | ICD-10-CM | POA: Diagnosis not present

## 2018-08-26 DIAGNOSIS — F515 Nightmare disorder: Secondary | ICD-10-CM | POA: Diagnosis not present

## 2018-08-26 DIAGNOSIS — S0990XA Unspecified injury of head, initial encounter: Secondary | ICD-10-CM | POA: Diagnosis not present

## 2018-08-26 DIAGNOSIS — Z9181 History of falling: Secondary | ICD-10-CM | POA: Diagnosis not present

## 2018-08-26 DIAGNOSIS — I10 Essential (primary) hypertension: Secondary | ICD-10-CM | POA: Diagnosis not present

## 2018-08-26 DIAGNOSIS — E782 Mixed hyperlipidemia: Secondary | ICD-10-CM | POA: Diagnosis not present

## 2018-09-15 DIAGNOSIS — E1122 Type 2 diabetes mellitus with diabetic chronic kidney disease: Secondary | ICD-10-CM | POA: Diagnosis not present

## 2018-09-15 DIAGNOSIS — E782 Mixed hyperlipidemia: Secondary | ICD-10-CM | POA: Diagnosis not present

## 2018-09-15 DIAGNOSIS — I10 Essential (primary) hypertension: Secondary | ICD-10-CM | POA: Diagnosis not present

## 2018-09-15 DIAGNOSIS — F0390 Unspecified dementia without behavioral disturbance: Secondary | ICD-10-CM | POA: Diagnosis not present

## 2018-10-04 DIAGNOSIS — E782 Mixed hyperlipidemia: Secondary | ICD-10-CM | POA: Diagnosis not present

## 2018-10-04 DIAGNOSIS — F5101 Primary insomnia: Secondary | ICD-10-CM | POA: Diagnosis not present

## 2018-10-04 DIAGNOSIS — E1122 Type 2 diabetes mellitus with diabetic chronic kidney disease: Secondary | ICD-10-CM | POA: Diagnosis not present

## 2018-10-04 DIAGNOSIS — M109 Gout, unspecified: Secondary | ICD-10-CM | POA: Diagnosis not present

## 2018-10-04 DIAGNOSIS — G8929 Other chronic pain: Secondary | ICD-10-CM | POA: Diagnosis not present

## 2018-10-04 DIAGNOSIS — N182 Chronic kidney disease, stage 2 (mild): Secondary | ICD-10-CM | POA: Diagnosis not present

## 2018-10-04 DIAGNOSIS — E1140 Type 2 diabetes mellitus with diabetic neuropathy, unspecified: Secondary | ICD-10-CM | POA: Diagnosis not present

## 2018-10-04 DIAGNOSIS — E785 Hyperlipidemia, unspecified: Secondary | ICD-10-CM | POA: Diagnosis not present

## 2018-10-04 DIAGNOSIS — M1000 Idiopathic gout, unspecified site: Secondary | ICD-10-CM | POA: Diagnosis not present

## 2018-10-27 DIAGNOSIS — M109 Gout, unspecified: Secondary | ICD-10-CM | POA: Diagnosis not present

## 2018-10-27 DIAGNOSIS — N182 Chronic kidney disease, stage 2 (mild): Secondary | ICD-10-CM | POA: Diagnosis not present

## 2018-10-27 DIAGNOSIS — E1122 Type 2 diabetes mellitus with diabetic chronic kidney disease: Secondary | ICD-10-CM | POA: Diagnosis not present

## 2018-10-27 DIAGNOSIS — E1140 Type 2 diabetes mellitus with diabetic neuropathy, unspecified: Secondary | ICD-10-CM | POA: Diagnosis not present

## 2018-10-27 DIAGNOSIS — M1000 Idiopathic gout, unspecified site: Secondary | ICD-10-CM | POA: Diagnosis not present

## 2018-10-27 DIAGNOSIS — I10 Essential (primary) hypertension: Secondary | ICD-10-CM | POA: Diagnosis not present

## 2018-10-27 DIAGNOSIS — E785 Hyperlipidemia, unspecified: Secondary | ICD-10-CM | POA: Diagnosis not present

## 2018-10-27 DIAGNOSIS — E782 Mixed hyperlipidemia: Secondary | ICD-10-CM | POA: Diagnosis not present

## 2018-10-29 DIAGNOSIS — H9203 Otalgia, bilateral: Secondary | ICD-10-CM | POA: Diagnosis not present

## 2018-12-07 DIAGNOSIS — Z0000 Encounter for general adult medical examination without abnormal findings: Secondary | ICD-10-CM | POA: Diagnosis not present

## 2018-12-21 DIAGNOSIS — M109 Gout, unspecified: Secondary | ICD-10-CM | POA: Diagnosis not present

## 2018-12-21 DIAGNOSIS — E782 Mixed hyperlipidemia: Secondary | ICD-10-CM | POA: Diagnosis not present

## 2018-12-21 DIAGNOSIS — I10 Essential (primary) hypertension: Secondary | ICD-10-CM | POA: Diagnosis not present

## 2018-12-21 DIAGNOSIS — N182 Chronic kidney disease, stage 2 (mild): Secondary | ICD-10-CM | POA: Diagnosis not present

## 2018-12-21 DIAGNOSIS — E1122 Type 2 diabetes mellitus with diabetic chronic kidney disease: Secondary | ICD-10-CM | POA: Diagnosis not present

## 2019-01-20 DIAGNOSIS — E785 Hyperlipidemia, unspecified: Secondary | ICD-10-CM | POA: Diagnosis not present

## 2019-01-20 DIAGNOSIS — N182 Chronic kidney disease, stage 2 (mild): Secondary | ICD-10-CM | POA: Diagnosis not present

## 2019-01-20 DIAGNOSIS — E782 Mixed hyperlipidemia: Secondary | ICD-10-CM | POA: Diagnosis not present

## 2019-01-20 DIAGNOSIS — E1122 Type 2 diabetes mellitus with diabetic chronic kidney disease: Secondary | ICD-10-CM | POA: Diagnosis not present

## 2019-01-20 DIAGNOSIS — E1140 Type 2 diabetes mellitus with diabetic neuropathy, unspecified: Secondary | ICD-10-CM | POA: Diagnosis not present

## 2019-01-20 DIAGNOSIS — I10 Essential (primary) hypertension: Secondary | ICD-10-CM | POA: Diagnosis not present

## 2019-01-20 DIAGNOSIS — M1000 Idiopathic gout, unspecified site: Secondary | ICD-10-CM | POA: Diagnosis not present

## 2019-01-20 DIAGNOSIS — M109 Gout, unspecified: Secondary | ICD-10-CM | POA: Diagnosis not present

## 2019-02-11 DIAGNOSIS — M1000 Idiopathic gout, unspecified site: Secondary | ICD-10-CM | POA: Diagnosis not present

## 2019-02-11 DIAGNOSIS — W06XXXA Fall from bed, initial encounter: Secondary | ICD-10-CM | POA: Diagnosis not present

## 2019-02-11 DIAGNOSIS — N182 Chronic kidney disease, stage 2 (mild): Secondary | ICD-10-CM | POA: Diagnosis not present

## 2019-02-11 DIAGNOSIS — E1140 Type 2 diabetes mellitus with diabetic neuropathy, unspecified: Secondary | ICD-10-CM | POA: Diagnosis not present

## 2019-02-11 DIAGNOSIS — M109 Gout, unspecified: Secondary | ICD-10-CM | POA: Diagnosis not present

## 2019-02-11 DIAGNOSIS — E1122 Type 2 diabetes mellitus with diabetic chronic kidney disease: Secondary | ICD-10-CM | POA: Diagnosis not present

## 2019-02-11 DIAGNOSIS — E785 Hyperlipidemia, unspecified: Secondary | ICD-10-CM | POA: Diagnosis not present

## 2019-02-11 DIAGNOSIS — E782 Mixed hyperlipidemia: Secondary | ICD-10-CM | POA: Diagnosis not present

## 2019-02-11 DIAGNOSIS — I10 Essential (primary) hypertension: Secondary | ICD-10-CM | POA: Diagnosis not present

## 2019-02-11 DIAGNOSIS — R2242 Localized swelling, mass and lump, left lower limb: Secondary | ICD-10-CM | POA: Diagnosis not present

## 2019-02-21 DIAGNOSIS — F0390 Unspecified dementia without behavioral disturbance: Secondary | ICD-10-CM | POA: Diagnosis not present

## 2019-02-21 DIAGNOSIS — I129 Hypertensive chronic kidney disease with stage 1 through stage 4 chronic kidney disease, or unspecified chronic kidney disease: Secondary | ICD-10-CM | POA: Diagnosis not present

## 2019-02-21 DIAGNOSIS — E1140 Type 2 diabetes mellitus with diabetic neuropathy, unspecified: Secondary | ICD-10-CM | POA: Diagnosis not present

## 2019-02-21 DIAGNOSIS — F411 Generalized anxiety disorder: Secondary | ICD-10-CM | POA: Diagnosis not present

## 2019-02-21 DIAGNOSIS — N182 Chronic kidney disease, stage 2 (mild): Secondary | ICD-10-CM | POA: Diagnosis not present

## 2019-02-21 DIAGNOSIS — M25562 Pain in left knee: Secondary | ICD-10-CM | POA: Diagnosis not present

## 2019-02-21 DIAGNOSIS — G8911 Acute pain due to trauma: Secondary | ICD-10-CM | POA: Diagnosis not present

## 2019-02-21 DIAGNOSIS — W06XXXD Fall from bed, subsequent encounter: Secondary | ICD-10-CM | POA: Diagnosis not present

## 2019-02-21 DIAGNOSIS — E1122 Type 2 diabetes mellitus with diabetic chronic kidney disease: Secondary | ICD-10-CM | POA: Diagnosis not present

## 2019-02-25 DIAGNOSIS — M25562 Pain in left knee: Secondary | ICD-10-CM | POA: Diagnosis not present

## 2019-02-25 DIAGNOSIS — I129 Hypertensive chronic kidney disease with stage 1 through stage 4 chronic kidney disease, or unspecified chronic kidney disease: Secondary | ICD-10-CM | POA: Diagnosis not present

## 2019-02-25 DIAGNOSIS — N182 Chronic kidney disease, stage 2 (mild): Secondary | ICD-10-CM | POA: Diagnosis not present

## 2019-02-25 DIAGNOSIS — W06XXXD Fall from bed, subsequent encounter: Secondary | ICD-10-CM | POA: Diagnosis not present

## 2019-02-25 DIAGNOSIS — G8911 Acute pain due to trauma: Secondary | ICD-10-CM | POA: Diagnosis not present

## 2019-02-25 DIAGNOSIS — E1122 Type 2 diabetes mellitus with diabetic chronic kidney disease: Secondary | ICD-10-CM | POA: Diagnosis not present

## 2019-02-25 DIAGNOSIS — E1140 Type 2 diabetes mellitus with diabetic neuropathy, unspecified: Secondary | ICD-10-CM | POA: Diagnosis not present

## 2019-02-25 DIAGNOSIS — F411 Generalized anxiety disorder: Secondary | ICD-10-CM | POA: Diagnosis not present

## 2019-02-25 DIAGNOSIS — F0390 Unspecified dementia without behavioral disturbance: Secondary | ICD-10-CM | POA: Diagnosis not present

## 2019-02-28 DIAGNOSIS — I129 Hypertensive chronic kidney disease with stage 1 through stage 4 chronic kidney disease, or unspecified chronic kidney disease: Secondary | ICD-10-CM | POA: Diagnosis not present

## 2019-02-28 DIAGNOSIS — M25562 Pain in left knee: Secondary | ICD-10-CM | POA: Diagnosis not present

## 2019-02-28 DIAGNOSIS — G8911 Acute pain due to trauma: Secondary | ICD-10-CM | POA: Diagnosis not present

## 2019-02-28 DIAGNOSIS — E1122 Type 2 diabetes mellitus with diabetic chronic kidney disease: Secondary | ICD-10-CM | POA: Diagnosis not present

## 2019-02-28 DIAGNOSIS — E1140 Type 2 diabetes mellitus with diabetic neuropathy, unspecified: Secondary | ICD-10-CM | POA: Diagnosis not present

## 2019-02-28 DIAGNOSIS — F0390 Unspecified dementia without behavioral disturbance: Secondary | ICD-10-CM | POA: Diagnosis not present

## 2019-02-28 DIAGNOSIS — N182 Chronic kidney disease, stage 2 (mild): Secondary | ICD-10-CM | POA: Diagnosis not present

## 2019-02-28 DIAGNOSIS — F411 Generalized anxiety disorder: Secondary | ICD-10-CM | POA: Diagnosis not present

## 2019-02-28 DIAGNOSIS — W06XXXD Fall from bed, subsequent encounter: Secondary | ICD-10-CM | POA: Diagnosis not present

## 2019-03-03 DIAGNOSIS — E1140 Type 2 diabetes mellitus with diabetic neuropathy, unspecified: Secondary | ICD-10-CM | POA: Diagnosis not present

## 2019-03-03 DIAGNOSIS — F411 Generalized anxiety disorder: Secondary | ICD-10-CM | POA: Diagnosis not present

## 2019-03-03 DIAGNOSIS — I129 Hypertensive chronic kidney disease with stage 1 through stage 4 chronic kidney disease, or unspecified chronic kidney disease: Secondary | ICD-10-CM | POA: Diagnosis not present

## 2019-03-03 DIAGNOSIS — E1122 Type 2 diabetes mellitus with diabetic chronic kidney disease: Secondary | ICD-10-CM | POA: Diagnosis not present

## 2019-03-03 DIAGNOSIS — W06XXXD Fall from bed, subsequent encounter: Secondary | ICD-10-CM | POA: Diagnosis not present

## 2019-03-03 DIAGNOSIS — N182 Chronic kidney disease, stage 2 (mild): Secondary | ICD-10-CM | POA: Diagnosis not present

## 2019-03-03 DIAGNOSIS — M25562 Pain in left knee: Secondary | ICD-10-CM | POA: Diagnosis not present

## 2019-03-03 DIAGNOSIS — F0390 Unspecified dementia without behavioral disturbance: Secondary | ICD-10-CM | POA: Diagnosis not present

## 2019-03-03 DIAGNOSIS — G8911 Acute pain due to trauma: Secondary | ICD-10-CM | POA: Diagnosis not present

## 2019-03-07 DIAGNOSIS — N182 Chronic kidney disease, stage 2 (mild): Secondary | ICD-10-CM | POA: Diagnosis not present

## 2019-03-07 DIAGNOSIS — W06XXXD Fall from bed, subsequent encounter: Secondary | ICD-10-CM | POA: Diagnosis not present

## 2019-03-07 DIAGNOSIS — E1122 Type 2 diabetes mellitus with diabetic chronic kidney disease: Secondary | ICD-10-CM | POA: Diagnosis not present

## 2019-03-07 DIAGNOSIS — E1140 Type 2 diabetes mellitus with diabetic neuropathy, unspecified: Secondary | ICD-10-CM | POA: Diagnosis not present

## 2019-03-07 DIAGNOSIS — I129 Hypertensive chronic kidney disease with stage 1 through stage 4 chronic kidney disease, or unspecified chronic kidney disease: Secondary | ICD-10-CM | POA: Diagnosis not present

## 2019-03-07 DIAGNOSIS — M25562 Pain in left knee: Secondary | ICD-10-CM | POA: Diagnosis not present

## 2019-03-07 DIAGNOSIS — F411 Generalized anxiety disorder: Secondary | ICD-10-CM | POA: Diagnosis not present

## 2019-03-07 DIAGNOSIS — G8911 Acute pain due to trauma: Secondary | ICD-10-CM | POA: Diagnosis not present

## 2019-03-07 DIAGNOSIS — F0390 Unspecified dementia without behavioral disturbance: Secondary | ICD-10-CM | POA: Diagnosis not present

## 2019-03-09 DIAGNOSIS — W06XXXD Fall from bed, subsequent encounter: Secondary | ICD-10-CM | POA: Diagnosis not present

## 2019-03-09 DIAGNOSIS — F0390 Unspecified dementia without behavioral disturbance: Secondary | ICD-10-CM | POA: Diagnosis not present

## 2019-03-09 DIAGNOSIS — M25562 Pain in left knee: Secondary | ICD-10-CM | POA: Diagnosis not present

## 2019-03-09 DIAGNOSIS — I129 Hypertensive chronic kidney disease with stage 1 through stage 4 chronic kidney disease, or unspecified chronic kidney disease: Secondary | ICD-10-CM | POA: Diagnosis not present

## 2019-03-09 DIAGNOSIS — F411 Generalized anxiety disorder: Secondary | ICD-10-CM | POA: Diagnosis not present

## 2019-03-09 DIAGNOSIS — E1140 Type 2 diabetes mellitus with diabetic neuropathy, unspecified: Secondary | ICD-10-CM | POA: Diagnosis not present

## 2019-03-09 DIAGNOSIS — G8911 Acute pain due to trauma: Secondary | ICD-10-CM | POA: Diagnosis not present

## 2019-03-09 DIAGNOSIS — N182 Chronic kidney disease, stage 2 (mild): Secondary | ICD-10-CM | POA: Diagnosis not present

## 2019-03-09 DIAGNOSIS — E1122 Type 2 diabetes mellitus with diabetic chronic kidney disease: Secondary | ICD-10-CM | POA: Diagnosis not present

## 2019-03-14 DIAGNOSIS — F0390 Unspecified dementia without behavioral disturbance: Secondary | ICD-10-CM | POA: Diagnosis not present

## 2019-03-14 DIAGNOSIS — M25562 Pain in left knee: Secondary | ICD-10-CM | POA: Diagnosis not present

## 2019-03-14 DIAGNOSIS — G8911 Acute pain due to trauma: Secondary | ICD-10-CM | POA: Diagnosis not present

## 2019-03-14 DIAGNOSIS — N182 Chronic kidney disease, stage 2 (mild): Secondary | ICD-10-CM | POA: Diagnosis not present

## 2019-03-14 DIAGNOSIS — E1122 Type 2 diabetes mellitus with diabetic chronic kidney disease: Secondary | ICD-10-CM | POA: Diagnosis not present

## 2019-03-14 DIAGNOSIS — E1140 Type 2 diabetes mellitus with diabetic neuropathy, unspecified: Secondary | ICD-10-CM | POA: Diagnosis not present

## 2019-03-14 DIAGNOSIS — F411 Generalized anxiety disorder: Secondary | ICD-10-CM | POA: Diagnosis not present

## 2019-03-14 DIAGNOSIS — I129 Hypertensive chronic kidney disease with stage 1 through stage 4 chronic kidney disease, or unspecified chronic kidney disease: Secondary | ICD-10-CM | POA: Diagnosis not present

## 2019-03-14 DIAGNOSIS — W06XXXD Fall from bed, subsequent encounter: Secondary | ICD-10-CM | POA: Diagnosis not present

## 2019-03-15 DIAGNOSIS — E1122 Type 2 diabetes mellitus with diabetic chronic kidney disease: Secondary | ICD-10-CM | POA: Diagnosis not present

## 2019-03-15 DIAGNOSIS — I10 Essential (primary) hypertension: Secondary | ICD-10-CM | POA: Diagnosis not present

## 2019-03-15 DIAGNOSIS — E1140 Type 2 diabetes mellitus with diabetic neuropathy, unspecified: Secondary | ICD-10-CM | POA: Diagnosis not present

## 2019-03-15 DIAGNOSIS — E782 Mixed hyperlipidemia: Secondary | ICD-10-CM | POA: Diagnosis not present

## 2019-03-15 DIAGNOSIS — E785 Hyperlipidemia, unspecified: Secondary | ICD-10-CM | POA: Diagnosis not present

## 2019-03-17 DIAGNOSIS — F411 Generalized anxiety disorder: Secondary | ICD-10-CM | POA: Diagnosis not present

## 2019-03-17 DIAGNOSIS — W06XXXD Fall from bed, subsequent encounter: Secondary | ICD-10-CM | POA: Diagnosis not present

## 2019-03-17 DIAGNOSIS — M25562 Pain in left knee: Secondary | ICD-10-CM | POA: Diagnosis not present

## 2019-03-17 DIAGNOSIS — I129 Hypertensive chronic kidney disease with stage 1 through stage 4 chronic kidney disease, or unspecified chronic kidney disease: Secondary | ICD-10-CM | POA: Diagnosis not present

## 2019-03-17 DIAGNOSIS — F0390 Unspecified dementia without behavioral disturbance: Secondary | ICD-10-CM | POA: Diagnosis not present

## 2019-03-17 DIAGNOSIS — N182 Chronic kidney disease, stage 2 (mild): Secondary | ICD-10-CM | POA: Diagnosis not present

## 2019-03-17 DIAGNOSIS — E1122 Type 2 diabetes mellitus with diabetic chronic kidney disease: Secondary | ICD-10-CM | POA: Diagnosis not present

## 2019-03-17 DIAGNOSIS — G8911 Acute pain due to trauma: Secondary | ICD-10-CM | POA: Diagnosis not present

## 2019-03-17 DIAGNOSIS — E1140 Type 2 diabetes mellitus with diabetic neuropathy, unspecified: Secondary | ICD-10-CM | POA: Diagnosis not present

## 2019-03-21 DIAGNOSIS — E1140 Type 2 diabetes mellitus with diabetic neuropathy, unspecified: Secondary | ICD-10-CM | POA: Diagnosis not present

## 2019-03-21 DIAGNOSIS — I10 Essential (primary) hypertension: Secondary | ICD-10-CM | POA: Diagnosis not present

## 2019-03-21 DIAGNOSIS — M7918 Myalgia, other site: Secondary | ICD-10-CM | POA: Diagnosis not present

## 2019-03-21 DIAGNOSIS — E1122 Type 2 diabetes mellitus with diabetic chronic kidney disease: Secondary | ICD-10-CM | POA: Diagnosis not present

## 2019-03-21 DIAGNOSIS — G9009 Other idiopathic peripheral autonomic neuropathy: Secondary | ICD-10-CM | POA: Diagnosis not present

## 2019-03-21 DIAGNOSIS — N182 Chronic kidney disease, stage 2 (mild): Secondary | ICD-10-CM | POA: Diagnosis not present

## 2019-03-21 DIAGNOSIS — E782 Mixed hyperlipidemia: Secondary | ICD-10-CM | POA: Diagnosis not present

## 2019-03-21 DIAGNOSIS — M109 Gout, unspecified: Secondary | ICD-10-CM | POA: Diagnosis not present

## 2019-03-21 DIAGNOSIS — G3184 Mild cognitive impairment, so stated: Secondary | ICD-10-CM | POA: Diagnosis not present

## 2019-03-22 DIAGNOSIS — W06XXXD Fall from bed, subsequent encounter: Secondary | ICD-10-CM | POA: Diagnosis not present

## 2019-03-22 DIAGNOSIS — E1122 Type 2 diabetes mellitus with diabetic chronic kidney disease: Secondary | ICD-10-CM | POA: Diagnosis not present

## 2019-03-22 DIAGNOSIS — I129 Hypertensive chronic kidney disease with stage 1 through stage 4 chronic kidney disease, or unspecified chronic kidney disease: Secondary | ICD-10-CM | POA: Diagnosis not present

## 2019-03-22 DIAGNOSIS — N182 Chronic kidney disease, stage 2 (mild): Secondary | ICD-10-CM | POA: Diagnosis not present

## 2019-03-22 DIAGNOSIS — E1140 Type 2 diabetes mellitus with diabetic neuropathy, unspecified: Secondary | ICD-10-CM | POA: Diagnosis not present

## 2019-03-22 DIAGNOSIS — F0390 Unspecified dementia without behavioral disturbance: Secondary | ICD-10-CM | POA: Diagnosis not present

## 2019-03-22 DIAGNOSIS — G8911 Acute pain due to trauma: Secondary | ICD-10-CM | POA: Diagnosis not present

## 2019-03-22 DIAGNOSIS — F411 Generalized anxiety disorder: Secondary | ICD-10-CM | POA: Diagnosis not present

## 2019-03-22 DIAGNOSIS — M25562 Pain in left knee: Secondary | ICD-10-CM | POA: Diagnosis not present

## 2019-03-29 DIAGNOSIS — E1122 Type 2 diabetes mellitus with diabetic chronic kidney disease: Secondary | ICD-10-CM | POA: Diagnosis not present

## 2019-03-29 DIAGNOSIS — F411 Generalized anxiety disorder: Secondary | ICD-10-CM | POA: Diagnosis not present

## 2019-03-29 DIAGNOSIS — G8911 Acute pain due to trauma: Secondary | ICD-10-CM | POA: Diagnosis not present

## 2019-03-29 DIAGNOSIS — W06XXXD Fall from bed, subsequent encounter: Secondary | ICD-10-CM | POA: Diagnosis not present

## 2019-03-29 DIAGNOSIS — I129 Hypertensive chronic kidney disease with stage 1 through stage 4 chronic kidney disease, or unspecified chronic kidney disease: Secondary | ICD-10-CM | POA: Diagnosis not present

## 2019-03-29 DIAGNOSIS — M25562 Pain in left knee: Secondary | ICD-10-CM | POA: Diagnosis not present

## 2019-03-29 DIAGNOSIS — E1140 Type 2 diabetes mellitus with diabetic neuropathy, unspecified: Secondary | ICD-10-CM | POA: Diagnosis not present

## 2019-03-29 DIAGNOSIS — F0390 Unspecified dementia without behavioral disturbance: Secondary | ICD-10-CM | POA: Diagnosis not present

## 2019-03-29 DIAGNOSIS — N182 Chronic kidney disease, stage 2 (mild): Secondary | ICD-10-CM | POA: Diagnosis not present

## 2019-04-15 DIAGNOSIS — M7918 Myalgia, other site: Secondary | ICD-10-CM | POA: Diagnosis not present

## 2019-04-15 DIAGNOSIS — E1140 Type 2 diabetes mellitus with diabetic neuropathy, unspecified: Secondary | ICD-10-CM | POA: Diagnosis not present

## 2019-04-15 DIAGNOSIS — I10 Essential (primary) hypertension: Secondary | ICD-10-CM | POA: Diagnosis not present

## 2019-04-15 DIAGNOSIS — E782 Mixed hyperlipidemia: Secondary | ICD-10-CM | POA: Diagnosis not present

## 2019-04-15 DIAGNOSIS — E1122 Type 2 diabetes mellitus with diabetic chronic kidney disease: Secondary | ICD-10-CM | POA: Diagnosis not present

## 2019-04-15 DIAGNOSIS — G3184 Mild cognitive impairment, so stated: Secondary | ICD-10-CM | POA: Diagnosis not present

## 2019-04-15 DIAGNOSIS — M109 Gout, unspecified: Secondary | ICD-10-CM | POA: Diagnosis not present

## 2019-04-15 DIAGNOSIS — G9009 Other idiopathic peripheral autonomic neuropathy: Secondary | ICD-10-CM | POA: Diagnosis not present

## 2019-04-15 DIAGNOSIS — N182 Chronic kidney disease, stage 2 (mild): Secondary | ICD-10-CM | POA: Diagnosis not present

## 2019-05-16 DIAGNOSIS — E1140 Type 2 diabetes mellitus with diabetic neuropathy, unspecified: Secondary | ICD-10-CM | POA: Diagnosis not present

## 2019-05-16 DIAGNOSIS — I10 Essential (primary) hypertension: Secondary | ICD-10-CM | POA: Diagnosis not present

## 2019-05-16 DIAGNOSIS — M7918 Myalgia, other site: Secondary | ICD-10-CM | POA: Diagnosis not present

## 2019-05-16 DIAGNOSIS — E782 Mixed hyperlipidemia: Secondary | ICD-10-CM | POA: Diagnosis not present

## 2019-05-16 DIAGNOSIS — N182 Chronic kidney disease, stage 2 (mild): Secondary | ICD-10-CM | POA: Diagnosis not present

## 2019-05-16 DIAGNOSIS — E1122 Type 2 diabetes mellitus with diabetic chronic kidney disease: Secondary | ICD-10-CM | POA: Diagnosis not present

## 2019-05-16 DIAGNOSIS — G3184 Mild cognitive impairment, so stated: Secondary | ICD-10-CM | POA: Diagnosis not present

## 2019-05-16 DIAGNOSIS — G9009 Other idiopathic peripheral autonomic neuropathy: Secondary | ICD-10-CM | POA: Diagnosis not present

## 2019-05-16 DIAGNOSIS — M109 Gout, unspecified: Secondary | ICD-10-CM | POA: Diagnosis not present

## 2019-06-17 DIAGNOSIS — M109 Gout, unspecified: Secondary | ICD-10-CM | POA: Diagnosis not present

## 2019-06-17 DIAGNOSIS — E1122 Type 2 diabetes mellitus with diabetic chronic kidney disease: Secondary | ICD-10-CM | POA: Diagnosis not present

## 2019-06-17 DIAGNOSIS — E782 Mixed hyperlipidemia: Secondary | ICD-10-CM | POA: Diagnosis not present

## 2019-06-17 DIAGNOSIS — R600 Localized edema: Secondary | ICD-10-CM | POA: Diagnosis not present

## 2019-06-17 DIAGNOSIS — E1140 Type 2 diabetes mellitus with diabetic neuropathy, unspecified: Secondary | ICD-10-CM | POA: Diagnosis not present

## 2019-06-17 DIAGNOSIS — I10 Essential (primary) hypertension: Secondary | ICD-10-CM | POA: Diagnosis not present

## 2019-06-17 DIAGNOSIS — N182 Chronic kidney disease, stage 2 (mild): Secondary | ICD-10-CM | POA: Diagnosis not present

## 2019-06-17 DIAGNOSIS — G3184 Mild cognitive impairment, so stated: Secondary | ICD-10-CM | POA: Diagnosis not present

## 2019-07-07 DIAGNOSIS — I129 Hypertensive chronic kidney disease with stage 1 through stage 4 chronic kidney disease, or unspecified chronic kidney disease: Secondary | ICD-10-CM | POA: Diagnosis not present

## 2019-07-07 DIAGNOSIS — E1122 Type 2 diabetes mellitus with diabetic chronic kidney disease: Secondary | ICD-10-CM | POA: Diagnosis not present

## 2019-07-07 DIAGNOSIS — M1000 Idiopathic gout, unspecified site: Secondary | ICD-10-CM | POA: Diagnosis not present

## 2019-07-07 DIAGNOSIS — N189 Chronic kidney disease, unspecified: Secondary | ICD-10-CM | POA: Diagnosis not present

## 2019-07-07 DIAGNOSIS — M109 Gout, unspecified: Secondary | ICD-10-CM | POA: Diagnosis not present

## 2019-07-07 DIAGNOSIS — E782 Mixed hyperlipidemia: Secondary | ICD-10-CM | POA: Diagnosis not present

## 2019-07-07 DIAGNOSIS — E785 Hyperlipidemia, unspecified: Secondary | ICD-10-CM | POA: Diagnosis not present

## 2019-07-07 DIAGNOSIS — I10 Essential (primary) hypertension: Secondary | ICD-10-CM | POA: Diagnosis not present

## 2019-07-07 DIAGNOSIS — E1140 Type 2 diabetes mellitus with diabetic neuropathy, unspecified: Secondary | ICD-10-CM | POA: Diagnosis not present

## 2019-07-25 DIAGNOSIS — E1122 Type 2 diabetes mellitus with diabetic chronic kidney disease: Secondary | ICD-10-CM | POA: Diagnosis not present

## 2019-07-25 DIAGNOSIS — I10 Essential (primary) hypertension: Secondary | ICD-10-CM | POA: Diagnosis not present

## 2019-07-25 DIAGNOSIS — E785 Hyperlipidemia, unspecified: Secondary | ICD-10-CM | POA: Diagnosis not present

## 2019-07-25 DIAGNOSIS — E1140 Type 2 diabetes mellitus with diabetic neuropathy, unspecified: Secondary | ICD-10-CM | POA: Diagnosis not present

## 2019-07-25 DIAGNOSIS — E782 Mixed hyperlipidemia: Secondary | ICD-10-CM | POA: Diagnosis not present

## 2019-07-28 DIAGNOSIS — E1122 Type 2 diabetes mellitus with diabetic chronic kidney disease: Secondary | ICD-10-CM | POA: Diagnosis not present

## 2019-07-28 DIAGNOSIS — G9009 Other idiopathic peripheral autonomic neuropathy: Secondary | ICD-10-CM | POA: Diagnosis not present

## 2019-07-28 DIAGNOSIS — M109 Gout, unspecified: Secondary | ICD-10-CM | POA: Diagnosis not present

## 2019-07-28 DIAGNOSIS — I10 Essential (primary) hypertension: Secondary | ICD-10-CM | POA: Diagnosis not present

## 2019-07-28 DIAGNOSIS — M7918 Myalgia, other site: Secondary | ICD-10-CM | POA: Diagnosis not present

## 2019-07-28 DIAGNOSIS — E782 Mixed hyperlipidemia: Secondary | ICD-10-CM | POA: Diagnosis not present

## 2019-07-28 DIAGNOSIS — E1140 Type 2 diabetes mellitus with diabetic neuropathy, unspecified: Secondary | ICD-10-CM | POA: Diagnosis not present

## 2019-07-28 DIAGNOSIS — G3184 Mild cognitive impairment, so stated: Secondary | ICD-10-CM | POA: Diagnosis not present

## 2019-07-28 DIAGNOSIS — N182 Chronic kidney disease, stage 2 (mild): Secondary | ICD-10-CM | POA: Diagnosis not present

## 2019-08-10 DIAGNOSIS — I129 Hypertensive chronic kidney disease with stage 1 through stage 4 chronic kidney disease, or unspecified chronic kidney disease: Secondary | ICD-10-CM | POA: Diagnosis not present

## 2019-08-10 DIAGNOSIS — M7918 Myalgia, other site: Secondary | ICD-10-CM | POA: Diagnosis not present

## 2019-08-10 DIAGNOSIS — M109 Gout, unspecified: Secondary | ICD-10-CM | POA: Diagnosis not present

## 2019-08-10 DIAGNOSIS — G3184 Mild cognitive impairment, so stated: Secondary | ICD-10-CM | POA: Diagnosis not present

## 2019-08-10 DIAGNOSIS — E1122 Type 2 diabetes mellitus with diabetic chronic kidney disease: Secondary | ICD-10-CM | POA: Diagnosis not present

## 2019-08-10 DIAGNOSIS — I10 Essential (primary) hypertension: Secondary | ICD-10-CM | POA: Diagnosis not present

## 2019-08-10 DIAGNOSIS — G9009 Other idiopathic peripheral autonomic neuropathy: Secondary | ICD-10-CM | POA: Diagnosis not present

## 2019-08-10 DIAGNOSIS — E7849 Other hyperlipidemia: Secondary | ICD-10-CM | POA: Diagnosis not present

## 2019-08-10 DIAGNOSIS — N182 Chronic kidney disease, stage 2 (mild): Secondary | ICD-10-CM | POA: Diagnosis not present

## 2019-09-10 ENCOUNTER — Ambulatory Visit: Payer: Medicare HMO

## 2019-09-16 DIAGNOSIS — M1000 Idiopathic gout, unspecified site: Secondary | ICD-10-CM | POA: Diagnosis not present

## 2019-09-16 DIAGNOSIS — M109 Gout, unspecified: Secondary | ICD-10-CM | POA: Diagnosis not present

## 2019-09-16 DIAGNOSIS — I129 Hypertensive chronic kidney disease with stage 1 through stage 4 chronic kidney disease, or unspecified chronic kidney disease: Secondary | ICD-10-CM | POA: Diagnosis not present

## 2019-09-16 DIAGNOSIS — I10 Essential (primary) hypertension: Secondary | ICD-10-CM | POA: Diagnosis not present

## 2019-09-16 DIAGNOSIS — E1140 Type 2 diabetes mellitus with diabetic neuropathy, unspecified: Secondary | ICD-10-CM | POA: Diagnosis not present

## 2019-09-16 DIAGNOSIS — E1122 Type 2 diabetes mellitus with diabetic chronic kidney disease: Secondary | ICD-10-CM | POA: Diagnosis not present

## 2019-09-16 DIAGNOSIS — E782 Mixed hyperlipidemia: Secondary | ICD-10-CM | POA: Diagnosis not present

## 2019-09-16 DIAGNOSIS — E785 Hyperlipidemia, unspecified: Secondary | ICD-10-CM | POA: Diagnosis not present

## 2019-09-16 DIAGNOSIS — N189 Chronic kidney disease, unspecified: Secondary | ICD-10-CM | POA: Diagnosis not present

## 2019-10-11 DIAGNOSIS — E1122 Type 2 diabetes mellitus with diabetic chronic kidney disease: Secondary | ICD-10-CM | POA: Diagnosis not present

## 2019-10-11 DIAGNOSIS — E782 Mixed hyperlipidemia: Secondary | ICD-10-CM | POA: Diagnosis not present

## 2019-10-11 DIAGNOSIS — E1140 Type 2 diabetes mellitus with diabetic neuropathy, unspecified: Secondary | ICD-10-CM | POA: Diagnosis not present

## 2019-10-11 DIAGNOSIS — N189 Chronic kidney disease, unspecified: Secondary | ICD-10-CM | POA: Diagnosis not present

## 2019-10-11 DIAGNOSIS — M1000 Idiopathic gout, unspecified site: Secondary | ICD-10-CM | POA: Diagnosis not present

## 2019-10-11 DIAGNOSIS — E785 Hyperlipidemia, unspecified: Secondary | ICD-10-CM | POA: Diagnosis not present

## 2019-10-11 DIAGNOSIS — M109 Gout, unspecified: Secondary | ICD-10-CM | POA: Diagnosis not present

## 2019-10-11 DIAGNOSIS — I10 Essential (primary) hypertension: Secondary | ICD-10-CM | POA: Diagnosis not present

## 2019-10-11 DIAGNOSIS — I129 Hypertensive chronic kidney disease with stage 1 through stage 4 chronic kidney disease, or unspecified chronic kidney disease: Secondary | ICD-10-CM | POA: Diagnosis not present

## 2019-11-10 DIAGNOSIS — E785 Hyperlipidemia, unspecified: Secondary | ICD-10-CM | POA: Diagnosis not present

## 2019-11-10 DIAGNOSIS — E1122 Type 2 diabetes mellitus with diabetic chronic kidney disease: Secondary | ICD-10-CM | POA: Diagnosis not present

## 2019-11-10 DIAGNOSIS — I10 Essential (primary) hypertension: Secondary | ICD-10-CM | POA: Diagnosis not present

## 2019-11-10 DIAGNOSIS — I129 Hypertensive chronic kidney disease with stage 1 through stage 4 chronic kidney disease, or unspecified chronic kidney disease: Secondary | ICD-10-CM | POA: Diagnosis not present

## 2019-11-10 DIAGNOSIS — E1140 Type 2 diabetes mellitus with diabetic neuropathy, unspecified: Secondary | ICD-10-CM | POA: Diagnosis not present

## 2019-11-10 DIAGNOSIS — N189 Chronic kidney disease, unspecified: Secondary | ICD-10-CM | POA: Diagnosis not present

## 2019-11-10 DIAGNOSIS — E782 Mixed hyperlipidemia: Secondary | ICD-10-CM | POA: Diagnosis not present

## 2019-11-10 DIAGNOSIS — M109 Gout, unspecified: Secondary | ICD-10-CM | POA: Diagnosis not present

## 2019-11-10 DIAGNOSIS — M1000 Idiopathic gout, unspecified site: Secondary | ICD-10-CM | POA: Diagnosis not present

## 2019-12-06 DIAGNOSIS — I10 Essential (primary) hypertension: Secondary | ICD-10-CM | POA: Diagnosis not present

## 2019-12-06 DIAGNOSIS — E1122 Type 2 diabetes mellitus with diabetic chronic kidney disease: Secondary | ICD-10-CM | POA: Diagnosis not present

## 2019-12-06 DIAGNOSIS — M1000 Idiopathic gout, unspecified site: Secondary | ICD-10-CM | POA: Diagnosis not present

## 2019-12-06 DIAGNOSIS — E785 Hyperlipidemia, unspecified: Secondary | ICD-10-CM | POA: Diagnosis not present

## 2019-12-06 DIAGNOSIS — E1140 Type 2 diabetes mellitus with diabetic neuropathy, unspecified: Secondary | ICD-10-CM | POA: Diagnosis not present

## 2019-12-06 DIAGNOSIS — N189 Chronic kidney disease, unspecified: Secondary | ICD-10-CM | POA: Diagnosis not present

## 2019-12-06 DIAGNOSIS — I129 Hypertensive chronic kidney disease with stage 1 through stage 4 chronic kidney disease, or unspecified chronic kidney disease: Secondary | ICD-10-CM | POA: Diagnosis not present

## 2019-12-06 DIAGNOSIS — M109 Gout, unspecified: Secondary | ICD-10-CM | POA: Diagnosis not present

## 2019-12-06 DIAGNOSIS — E782 Mixed hyperlipidemia: Secondary | ICD-10-CM | POA: Diagnosis not present

## 2020-01-09 DIAGNOSIS — E1140 Type 2 diabetes mellitus with diabetic neuropathy, unspecified: Secondary | ICD-10-CM | POA: Diagnosis not present

## 2020-01-09 DIAGNOSIS — N189 Chronic kidney disease, unspecified: Secondary | ICD-10-CM | POA: Diagnosis not present

## 2020-01-09 DIAGNOSIS — M109 Gout, unspecified: Secondary | ICD-10-CM | POA: Diagnosis not present

## 2020-01-09 DIAGNOSIS — I129 Hypertensive chronic kidney disease with stage 1 through stage 4 chronic kidney disease, or unspecified chronic kidney disease: Secondary | ICD-10-CM | POA: Diagnosis not present

## 2020-01-09 DIAGNOSIS — E1122 Type 2 diabetes mellitus with diabetic chronic kidney disease: Secondary | ICD-10-CM | POA: Diagnosis not present

## 2020-01-09 DIAGNOSIS — E785 Hyperlipidemia, unspecified: Secondary | ICD-10-CM | POA: Diagnosis not present

## 2020-01-09 DIAGNOSIS — M1000 Idiopathic gout, unspecified site: Secondary | ICD-10-CM | POA: Diagnosis not present

## 2020-01-09 DIAGNOSIS — I10 Essential (primary) hypertension: Secondary | ICD-10-CM | POA: Diagnosis not present

## 2020-01-09 DIAGNOSIS — E782 Mixed hyperlipidemia: Secondary | ICD-10-CM | POA: Diagnosis not present

## 2020-02-06 DIAGNOSIS — E1140 Type 2 diabetes mellitus with diabetic neuropathy, unspecified: Secondary | ICD-10-CM | POA: Diagnosis not present

## 2020-02-06 DIAGNOSIS — N189 Chronic kidney disease, unspecified: Secondary | ICD-10-CM | POA: Diagnosis not present

## 2020-02-06 DIAGNOSIS — M1000 Idiopathic gout, unspecified site: Secondary | ICD-10-CM | POA: Diagnosis not present

## 2020-02-06 DIAGNOSIS — E785 Hyperlipidemia, unspecified: Secondary | ICD-10-CM | POA: Diagnosis not present

## 2020-02-06 DIAGNOSIS — E1122 Type 2 diabetes mellitus with diabetic chronic kidney disease: Secondary | ICD-10-CM | POA: Diagnosis not present

## 2020-02-06 DIAGNOSIS — M109 Gout, unspecified: Secondary | ICD-10-CM | POA: Diagnosis not present

## 2020-02-06 DIAGNOSIS — E782 Mixed hyperlipidemia: Secondary | ICD-10-CM | POA: Diagnosis not present

## 2020-02-06 DIAGNOSIS — I10 Essential (primary) hypertension: Secondary | ICD-10-CM | POA: Diagnosis not present

## 2020-02-06 DIAGNOSIS — I129 Hypertensive chronic kidney disease with stage 1 through stage 4 chronic kidney disease, or unspecified chronic kidney disease: Secondary | ICD-10-CM | POA: Diagnosis not present

## 2020-03-05 DIAGNOSIS — N189 Chronic kidney disease, unspecified: Secondary | ICD-10-CM | POA: Diagnosis not present

## 2020-03-05 DIAGNOSIS — E782 Mixed hyperlipidemia: Secondary | ICD-10-CM | POA: Diagnosis not present

## 2020-03-05 DIAGNOSIS — M109 Gout, unspecified: Secondary | ICD-10-CM | POA: Diagnosis not present

## 2020-03-05 DIAGNOSIS — E1140 Type 2 diabetes mellitus with diabetic neuropathy, unspecified: Secondary | ICD-10-CM | POA: Diagnosis not present

## 2020-03-05 DIAGNOSIS — E785 Hyperlipidemia, unspecified: Secondary | ICD-10-CM | POA: Diagnosis not present

## 2020-03-05 DIAGNOSIS — E1122 Type 2 diabetes mellitus with diabetic chronic kidney disease: Secondary | ICD-10-CM | POA: Diagnosis not present

## 2020-03-05 DIAGNOSIS — I10 Essential (primary) hypertension: Secondary | ICD-10-CM | POA: Diagnosis not present

## 2020-03-05 DIAGNOSIS — M1000 Idiopathic gout, unspecified site: Secondary | ICD-10-CM | POA: Diagnosis not present

## 2020-03-05 DIAGNOSIS — I129 Hypertensive chronic kidney disease with stage 1 through stage 4 chronic kidney disease, or unspecified chronic kidney disease: Secondary | ICD-10-CM | POA: Diagnosis not present

## 2020-03-08 DIAGNOSIS — G3184 Mild cognitive impairment, so stated: Secondary | ICD-10-CM | POA: Diagnosis not present

## 2020-03-08 DIAGNOSIS — R441 Visual hallucinations: Secondary | ICD-10-CM | POA: Diagnosis not present

## 2020-03-28 ENCOUNTER — Encounter: Payer: Self-pay | Admitting: Neurology

## 2020-03-28 DIAGNOSIS — E1122 Type 2 diabetes mellitus with diabetic chronic kidney disease: Secondary | ICD-10-CM | POA: Diagnosis not present

## 2020-03-28 DIAGNOSIS — G3184 Mild cognitive impairment, so stated: Secondary | ICD-10-CM | POA: Diagnosis not present

## 2020-03-28 DIAGNOSIS — F330 Major depressive disorder, recurrent, mild: Secondary | ICD-10-CM | POA: Diagnosis not present

## 2020-03-28 DIAGNOSIS — R0989 Other specified symptoms and signs involving the circulatory and respiratory systems: Secondary | ICD-10-CM | POA: Diagnosis not present

## 2020-03-28 DIAGNOSIS — E782 Mixed hyperlipidemia: Secondary | ICD-10-CM | POA: Diagnosis not present

## 2020-03-28 DIAGNOSIS — Z712 Person consulting for explanation of examination or test findings: Secondary | ICD-10-CM | POA: Diagnosis not present

## 2020-03-28 DIAGNOSIS — M7918 Myalgia, other site: Secondary | ICD-10-CM | POA: Diagnosis not present

## 2020-03-28 DIAGNOSIS — M109 Gout, unspecified: Secondary | ICD-10-CM | POA: Diagnosis not present

## 2020-03-28 DIAGNOSIS — Z7409 Other reduced mobility: Secondary | ICD-10-CM | POA: Diagnosis not present

## 2020-03-28 DIAGNOSIS — G8929 Other chronic pain: Secondary | ICD-10-CM | POA: Diagnosis not present

## 2020-03-28 DIAGNOSIS — R441 Visual hallucinations: Secondary | ICD-10-CM | POA: Diagnosis not present

## 2020-03-28 DIAGNOSIS — Z0000 Encounter for general adult medical examination without abnormal findings: Secondary | ICD-10-CM | POA: Diagnosis not present

## 2020-03-28 DIAGNOSIS — N189 Chronic kidney disease, unspecified: Secondary | ICD-10-CM | POA: Diagnosis not present

## 2020-03-28 DIAGNOSIS — N390 Urinary tract infection, site not specified: Secondary | ICD-10-CM | POA: Diagnosis not present

## 2020-04-09 DIAGNOSIS — I129 Hypertensive chronic kidney disease with stage 1 through stage 4 chronic kidney disease, or unspecified chronic kidney disease: Secondary | ICD-10-CM | POA: Diagnosis not present

## 2020-04-09 DIAGNOSIS — M1000 Idiopathic gout, unspecified site: Secondary | ICD-10-CM | POA: Diagnosis not present

## 2020-04-09 DIAGNOSIS — E782 Mixed hyperlipidemia: Secondary | ICD-10-CM | POA: Diagnosis not present

## 2020-04-09 DIAGNOSIS — I10 Essential (primary) hypertension: Secondary | ICD-10-CM | POA: Diagnosis not present

## 2020-04-09 DIAGNOSIS — E1140 Type 2 diabetes mellitus with diabetic neuropathy, unspecified: Secondary | ICD-10-CM | POA: Diagnosis not present

## 2020-04-09 DIAGNOSIS — N189 Chronic kidney disease, unspecified: Secondary | ICD-10-CM | POA: Diagnosis not present

## 2020-04-09 DIAGNOSIS — E1122 Type 2 diabetes mellitus with diabetic chronic kidney disease: Secondary | ICD-10-CM | POA: Diagnosis not present

## 2020-04-09 DIAGNOSIS — E785 Hyperlipidemia, unspecified: Secondary | ICD-10-CM | POA: Diagnosis not present

## 2020-04-09 DIAGNOSIS — M109 Gout, unspecified: Secondary | ICD-10-CM | POA: Diagnosis not present

## 2020-04-18 DIAGNOSIS — G3184 Mild cognitive impairment, so stated: Secondary | ICD-10-CM | POA: Diagnosis not present

## 2020-04-18 DIAGNOSIS — F330 Major depressive disorder, recurrent, mild: Secondary | ICD-10-CM | POA: Diagnosis not present

## 2020-04-18 DIAGNOSIS — F0391 Unspecified dementia with behavioral disturbance: Secondary | ICD-10-CM | POA: Diagnosis not present

## 2020-04-18 DIAGNOSIS — N390 Urinary tract infection, site not specified: Secondary | ICD-10-CM | POA: Diagnosis not present

## 2020-04-27 DIAGNOSIS — N189 Chronic kidney disease, unspecified: Secondary | ICD-10-CM | POA: Diagnosis not present

## 2020-04-27 DIAGNOSIS — Z0000 Encounter for general adult medical examination without abnormal findings: Secondary | ICD-10-CM | POA: Diagnosis not present

## 2020-04-27 DIAGNOSIS — M7918 Myalgia, other site: Secondary | ICD-10-CM | POA: Diagnosis not present

## 2020-04-27 DIAGNOSIS — G3184 Mild cognitive impairment, so stated: Secondary | ICD-10-CM | POA: Diagnosis not present

## 2020-04-27 DIAGNOSIS — F330 Major depressive disorder, recurrent, mild: Secondary | ICD-10-CM | POA: Diagnosis not present

## 2020-04-27 DIAGNOSIS — Z712 Person consulting for explanation of examination or test findings: Secondary | ICD-10-CM | POA: Diagnosis not present

## 2020-04-27 DIAGNOSIS — R0989 Other specified symptoms and signs involving the circulatory and respiratory systems: Secondary | ICD-10-CM | POA: Diagnosis not present

## 2020-04-27 DIAGNOSIS — M109 Gout, unspecified: Secondary | ICD-10-CM | POA: Diagnosis not present

## 2020-04-27 DIAGNOSIS — N390 Urinary tract infection, site not specified: Secondary | ICD-10-CM | POA: Diagnosis not present

## 2020-04-27 DIAGNOSIS — E782 Mixed hyperlipidemia: Secondary | ICD-10-CM | POA: Diagnosis not present

## 2020-04-27 DIAGNOSIS — F0391 Unspecified dementia with behavioral disturbance: Secondary | ICD-10-CM | POA: Diagnosis not present

## 2020-04-27 DIAGNOSIS — E1122 Type 2 diabetes mellitus with diabetic chronic kidney disease: Secondary | ICD-10-CM | POA: Diagnosis not present

## 2020-04-27 DIAGNOSIS — G8929 Other chronic pain: Secondary | ICD-10-CM | POA: Diagnosis not present

## 2020-04-28 DIAGNOSIS — N390 Urinary tract infection, site not specified: Secondary | ICD-10-CM | POA: Diagnosis not present

## 2020-04-28 DIAGNOSIS — G3184 Mild cognitive impairment, so stated: Secondary | ICD-10-CM | POA: Diagnosis not present

## 2020-04-28 DIAGNOSIS — F0391 Unspecified dementia with behavioral disturbance: Secondary | ICD-10-CM | POA: Diagnosis not present

## 2020-04-28 DIAGNOSIS — F330 Major depressive disorder, recurrent, mild: Secondary | ICD-10-CM | POA: Diagnosis not present

## 2020-05-02 DIAGNOSIS — Z23 Encounter for immunization: Secondary | ICD-10-CM | POA: Diagnosis not present

## 2020-05-02 DIAGNOSIS — N390 Urinary tract infection, site not specified: Secondary | ICD-10-CM | POA: Diagnosis not present

## 2020-05-02 DIAGNOSIS — Z0001 Encounter for general adult medical examination with abnormal findings: Secondary | ICD-10-CM | POA: Diagnosis not present

## 2020-05-02 DIAGNOSIS — F0391 Unspecified dementia with behavioral disturbance: Secondary | ICD-10-CM | POA: Diagnosis not present

## 2020-05-02 DIAGNOSIS — E1122 Type 2 diabetes mellitus with diabetic chronic kidney disease: Secondary | ICD-10-CM | POA: Diagnosis not present

## 2020-05-02 DIAGNOSIS — F330 Major depressive disorder, recurrent, mild: Secondary | ICD-10-CM | POA: Diagnosis not present

## 2020-05-02 DIAGNOSIS — M109 Gout, unspecified: Secondary | ICD-10-CM | POA: Diagnosis not present

## 2020-05-02 DIAGNOSIS — E782 Mixed hyperlipidemia: Secondary | ICD-10-CM | POA: Diagnosis not present

## 2020-05-02 DIAGNOSIS — I10 Essential (primary) hypertension: Secondary | ICD-10-CM | POA: Diagnosis not present

## 2020-05-02 DIAGNOSIS — G9009 Other idiopathic peripheral autonomic neuropathy: Secondary | ICD-10-CM | POA: Diagnosis not present

## 2020-05-02 DIAGNOSIS — E1140 Type 2 diabetes mellitus with diabetic neuropathy, unspecified: Secondary | ICD-10-CM | POA: Diagnosis not present

## 2020-05-02 DIAGNOSIS — G3184 Mild cognitive impairment, so stated: Secondary | ICD-10-CM | POA: Diagnosis not present

## 2020-05-02 DIAGNOSIS — M7918 Myalgia, other site: Secondary | ICD-10-CM | POA: Diagnosis not present

## 2020-05-03 DIAGNOSIS — I10 Essential (primary) hypertension: Secondary | ICD-10-CM | POA: Diagnosis not present

## 2020-05-03 DIAGNOSIS — R42 Dizziness and giddiness: Secondary | ICD-10-CM | POA: Diagnosis not present

## 2020-05-03 DIAGNOSIS — W19XXXA Unspecified fall, initial encounter: Secondary | ICD-10-CM | POA: Diagnosis not present

## 2020-05-03 DIAGNOSIS — N390 Urinary tract infection, site not specified: Secondary | ICD-10-CM | POA: Diagnosis not present

## 2020-05-03 DIAGNOSIS — F330 Major depressive disorder, recurrent, mild: Secondary | ICD-10-CM | POA: Diagnosis not present

## 2020-05-03 DIAGNOSIS — G3184 Mild cognitive impairment, so stated: Secondary | ICD-10-CM | POA: Diagnosis not present

## 2020-05-03 DIAGNOSIS — F0391 Unspecified dementia with behavioral disturbance: Secondary | ICD-10-CM | POA: Diagnosis not present

## 2020-05-10 DIAGNOSIS — I129 Hypertensive chronic kidney disease with stage 1 through stage 4 chronic kidney disease, or unspecified chronic kidney disease: Secondary | ICD-10-CM | POA: Diagnosis not present

## 2020-05-10 DIAGNOSIS — E785 Hyperlipidemia, unspecified: Secondary | ICD-10-CM | POA: Diagnosis not present

## 2020-05-10 DIAGNOSIS — I10 Essential (primary) hypertension: Secondary | ICD-10-CM | POA: Diagnosis not present

## 2020-05-10 DIAGNOSIS — M109 Gout, unspecified: Secondary | ICD-10-CM | POA: Diagnosis not present

## 2020-05-10 DIAGNOSIS — E782 Mixed hyperlipidemia: Secondary | ICD-10-CM | POA: Diagnosis not present

## 2020-05-10 DIAGNOSIS — M1000 Idiopathic gout, unspecified site: Secondary | ICD-10-CM | POA: Diagnosis not present

## 2020-05-10 DIAGNOSIS — N189 Chronic kidney disease, unspecified: Secondary | ICD-10-CM | POA: Diagnosis not present

## 2020-05-10 DIAGNOSIS — E1140 Type 2 diabetes mellitus with diabetic neuropathy, unspecified: Secondary | ICD-10-CM | POA: Diagnosis not present

## 2020-05-10 DIAGNOSIS — E1122 Type 2 diabetes mellitus with diabetic chronic kidney disease: Secondary | ICD-10-CM | POA: Diagnosis not present

## 2020-05-14 DIAGNOSIS — G3184 Mild cognitive impairment, so stated: Secondary | ICD-10-CM | POA: Diagnosis not present

## 2020-05-14 DIAGNOSIS — N390 Urinary tract infection, site not specified: Secondary | ICD-10-CM | POA: Diagnosis not present

## 2020-05-14 DIAGNOSIS — F330 Major depressive disorder, recurrent, mild: Secondary | ICD-10-CM | POA: Diagnosis not present

## 2020-05-14 DIAGNOSIS — F0391 Unspecified dementia with behavioral disturbance: Secondary | ICD-10-CM | POA: Diagnosis not present

## 2020-05-16 DIAGNOSIS — G3184 Mild cognitive impairment, so stated: Secondary | ICD-10-CM | POA: Diagnosis not present

## 2020-05-16 DIAGNOSIS — F330 Major depressive disorder, recurrent, mild: Secondary | ICD-10-CM | POA: Diagnosis not present

## 2020-05-16 DIAGNOSIS — F0391 Unspecified dementia with behavioral disturbance: Secondary | ICD-10-CM | POA: Diagnosis not present

## 2020-05-16 DIAGNOSIS — N390 Urinary tract infection, site not specified: Secondary | ICD-10-CM | POA: Diagnosis not present

## 2020-05-17 ENCOUNTER — Encounter: Payer: Self-pay | Admitting: Podiatry

## 2020-05-17 ENCOUNTER — Ambulatory Visit (INDEPENDENT_AMBULATORY_CARE_PROVIDER_SITE_OTHER): Payer: Medicare HMO | Admitting: Podiatry

## 2020-05-17 ENCOUNTER — Other Ambulatory Visit: Payer: Self-pay

## 2020-05-17 DIAGNOSIS — B351 Tinea unguium: Secondary | ICD-10-CM | POA: Diagnosis not present

## 2020-05-17 DIAGNOSIS — M79675 Pain in left toe(s): Secondary | ICD-10-CM | POA: Diagnosis not present

## 2020-05-17 DIAGNOSIS — M79674 Pain in right toe(s): Secondary | ICD-10-CM | POA: Diagnosis not present

## 2020-05-17 NOTE — Progress Notes (Signed)
  Subjective:  Patient ID: Jasmine Bridges, female    DOB: Jul 28, 1933,  MRN: [id number]  Chief Complaint  Patient presents with  . ROUTINE FOOT CARE    NAIL TRIM   86 y.o. female returns for the above complaint.  Patient presents with complaint of thickened elongated dystrophic toenails x10.  Patient states is painful to touch.  She states that she would like to have them debrided down.  She has not been seen by anyone else prior to seeing me.  She denies any other acute complaints.  Objective:  There were no vitals filed for this visit. Podiatric Exam: Vascular: dorsalis pedis and posterior tibial pulses are palpable bilateral. Capillary return is immediate. Temperature gradient is WNL. Skin turgor WNL  Sensorium: Normal Semmes Weinstein monofilament test. Normal tactile sensation bilaterally. Nail Exam: Pt has thick disfigured discolored nails with subungual debris noted bilateral entire nail hallux through fifth toenails.  Pain on palpation to the nails. Ulcer Exam: There is no evidence of ulcer or pre-ulcerative changes or infection. Orthopedic Exam: Muscle tone and strength are WNL. No limitations in general ROM. No crepitus or effusions noted. HAV  B/L.  Hammer toes 2-5  B/L. Skin: No Porokeratosis. No infection or ulcers    Assessment & Plan:   1. Pain due to onychomycosis of toenails of both feet     Patient was evaluated and treated and all questions answered.  Onychomycosis with pain  -Nails palliatively debrided as below. -Educated on self-care  Procedure: Nail Debridement Rationale: pain  Type of Debridement: manual, sharp debridement. Instrumentation: Nail nipper, rotary burr. Number of Nails: 10  Procedures and Treatment: Consent by patient was obtained for treatment procedures. The patient understood the discussion of treatment and procedures well. All questions were answered thoroughly reviewed. Debridement of mycotic and hypertrophic toenails, 1 through 5  bilateral and clearing of subungual debris. No ulceration, no infection noted.  Return Visit-Office Procedure: Patient instructed to return to the office for a follow up visit 3 months for continued evaluation and treatment.  Boneta Lucks, DPM    No follow-ups on file.

## 2020-05-18 DIAGNOSIS — N390 Urinary tract infection, site not specified: Secondary | ICD-10-CM | POA: Diagnosis not present

## 2020-05-18 DIAGNOSIS — G3184 Mild cognitive impairment, so stated: Secondary | ICD-10-CM | POA: Diagnosis not present

## 2020-05-18 DIAGNOSIS — F330 Major depressive disorder, recurrent, mild: Secondary | ICD-10-CM | POA: Diagnosis not present

## 2020-05-18 DIAGNOSIS — F0391 Unspecified dementia with behavioral disturbance: Secondary | ICD-10-CM | POA: Diagnosis not present

## 2020-05-21 DIAGNOSIS — G3184 Mild cognitive impairment, so stated: Secondary | ICD-10-CM | POA: Diagnosis not present

## 2020-05-21 DIAGNOSIS — F0391 Unspecified dementia with behavioral disturbance: Secondary | ICD-10-CM | POA: Diagnosis not present

## 2020-05-21 DIAGNOSIS — N390 Urinary tract infection, site not specified: Secondary | ICD-10-CM | POA: Diagnosis not present

## 2020-05-21 DIAGNOSIS — F330 Major depressive disorder, recurrent, mild: Secondary | ICD-10-CM | POA: Diagnosis not present

## 2020-05-24 DIAGNOSIS — F0391 Unspecified dementia with behavioral disturbance: Secondary | ICD-10-CM | POA: Diagnosis not present

## 2020-05-24 DIAGNOSIS — N390 Urinary tract infection, site not specified: Secondary | ICD-10-CM | POA: Diagnosis not present

## 2020-05-24 DIAGNOSIS — F330 Major depressive disorder, recurrent, mild: Secondary | ICD-10-CM | POA: Diagnosis not present

## 2020-05-24 DIAGNOSIS — G3184 Mild cognitive impairment, so stated: Secondary | ICD-10-CM | POA: Diagnosis not present

## 2020-06-01 DIAGNOSIS — N390 Urinary tract infection, site not specified: Secondary | ICD-10-CM | POA: Diagnosis not present

## 2020-06-01 DIAGNOSIS — F0391 Unspecified dementia with behavioral disturbance: Secondary | ICD-10-CM | POA: Diagnosis not present

## 2020-06-01 DIAGNOSIS — F330 Major depressive disorder, recurrent, mild: Secondary | ICD-10-CM | POA: Diagnosis not present

## 2020-06-01 DIAGNOSIS — G3184 Mild cognitive impairment, so stated: Secondary | ICD-10-CM | POA: Diagnosis not present

## 2020-06-04 DIAGNOSIS — N390 Urinary tract infection, site not specified: Secondary | ICD-10-CM | POA: Diagnosis not present

## 2020-06-04 DIAGNOSIS — F0391 Unspecified dementia with behavioral disturbance: Secondary | ICD-10-CM | POA: Diagnosis not present

## 2020-06-04 DIAGNOSIS — G3184 Mild cognitive impairment, so stated: Secondary | ICD-10-CM | POA: Diagnosis not present

## 2020-06-04 DIAGNOSIS — F330 Major depressive disorder, recurrent, mild: Secondary | ICD-10-CM | POA: Diagnosis not present

## 2020-06-08 DIAGNOSIS — E1140 Type 2 diabetes mellitus with diabetic neuropathy, unspecified: Secondary | ICD-10-CM | POA: Diagnosis not present

## 2020-06-08 DIAGNOSIS — N390 Urinary tract infection, site not specified: Secondary | ICD-10-CM | POA: Diagnosis not present

## 2020-06-08 DIAGNOSIS — G3184 Mild cognitive impairment, so stated: Secondary | ICD-10-CM | POA: Diagnosis not present

## 2020-06-08 DIAGNOSIS — F0391 Unspecified dementia with behavioral disturbance: Secondary | ICD-10-CM | POA: Diagnosis not present

## 2020-06-08 DIAGNOSIS — E1122 Type 2 diabetes mellitus with diabetic chronic kidney disease: Secondary | ICD-10-CM | POA: Diagnosis not present

## 2020-06-08 DIAGNOSIS — E782 Mixed hyperlipidemia: Secondary | ICD-10-CM | POA: Diagnosis not present

## 2020-06-08 DIAGNOSIS — M1000 Idiopathic gout, unspecified site: Secondary | ICD-10-CM | POA: Diagnosis not present

## 2020-06-08 DIAGNOSIS — I10 Essential (primary) hypertension: Secondary | ICD-10-CM | POA: Diagnosis not present

## 2020-06-08 DIAGNOSIS — E785 Hyperlipidemia, unspecified: Secondary | ICD-10-CM | POA: Diagnosis not present

## 2020-06-08 DIAGNOSIS — M109 Gout, unspecified: Secondary | ICD-10-CM | POA: Diagnosis not present

## 2020-06-08 DIAGNOSIS — N189 Chronic kidney disease, unspecified: Secondary | ICD-10-CM | POA: Diagnosis not present

## 2020-06-08 DIAGNOSIS — F330 Major depressive disorder, recurrent, mild: Secondary | ICD-10-CM | POA: Diagnosis not present

## 2020-06-08 DIAGNOSIS — I129 Hypertensive chronic kidney disease with stage 1 through stage 4 chronic kidney disease, or unspecified chronic kidney disease: Secondary | ICD-10-CM | POA: Diagnosis not present

## 2020-06-13 DIAGNOSIS — N390 Urinary tract infection, site not specified: Secondary | ICD-10-CM | POA: Diagnosis not present

## 2020-06-13 DIAGNOSIS — F0391 Unspecified dementia with behavioral disturbance: Secondary | ICD-10-CM | POA: Diagnosis not present

## 2020-06-13 DIAGNOSIS — G3184 Mild cognitive impairment, so stated: Secondary | ICD-10-CM | POA: Diagnosis not present

## 2020-06-13 DIAGNOSIS — F330 Major depressive disorder, recurrent, mild: Secondary | ICD-10-CM | POA: Diagnosis not present

## 2020-07-16 DIAGNOSIS — W19XXXA Unspecified fall, initial encounter: Secondary | ICD-10-CM | POA: Diagnosis not present

## 2020-07-16 DIAGNOSIS — T1490XA Injury, unspecified, initial encounter: Secondary | ICD-10-CM | POA: Diagnosis not present

## 2020-07-23 ENCOUNTER — Encounter: Payer: Self-pay | Admitting: Neurology

## 2020-07-23 ENCOUNTER — Other Ambulatory Visit: Payer: Self-pay

## 2020-07-23 ENCOUNTER — Ambulatory Visit: Payer: Medicare HMO | Admitting: Neurology

## 2020-07-23 VITALS — BP 160/82 | HR 98 | Ht 63.0 in | Wt 150.4 lb

## 2020-07-23 DIAGNOSIS — F03B18 Unspecified dementia, moderate, with other behavioral disturbance: Secondary | ICD-10-CM

## 2020-07-23 DIAGNOSIS — F0391 Unspecified dementia with behavioral disturbance: Secondary | ICD-10-CM

## 2020-07-23 NOTE — Progress Notes (Signed)
NEUROLOGY CONSULTATION NOTE  Jasmine Bridges MRN: [id number] DOB: 24-May-1933  Referring provider: Dr. Nita Bridges Primary care provider: Dr. Nita Bridges  Reason for consult:  Memory loss, hallucinations  Dear Jasmine Bridges:  Thank you for your kind referral of Jasmine Bridges for consultation of the above symptoms. Although her history is well known to you, please allow me to reiterate it for the purpose of our medical record. The patient was accompanied to the clinic by her daughter who also provides collateral information. Records and images were personally reviewed where available.   HISTORY OF PRESENT ILLNESS: This is an 86 year old right-handed woman with a history of hypertension, hyperlipidemia, diabetes, GERD, presenting for evaluation of memory loss, hallucinations. Her daughter Jasmine Bridges is present to provide additional information. She states her memory is not good. [name] in the past year, she was previously living alone until [name] came in 2019 to help her out. She stopped driving around that time. She states that "until here lately, I don't get nothing right" with her Bills. [name] corrects her and tells her that is not right, "tell her the truth," she still does her bills, [name] denies any difficulties with this, she can call and pay her bills on the phone. She however has had difficulties with medication management. She had not been taking her medications so nurses started coming 6 months ago, she also had physical therapy and social work come in. [facility] nurse recently stopped coming because she had not been taking her medications, "just stopped it."  She states she manages her own medications and forgets them all the time, "I have no business doing thatKohl's notes that around 4 months ago she was still putting her medications in the pillbox. She was prescribed unrecalled medication for memory and depression, but states that the medication (not sure which one) is affecting her  stomach. She does not bathe, [name] is unable to help due to her own health issues. Visual hallucinations started 4-5 months ago, it appears she was prescribed Seroquel in August 2021 but has not been taking this. She states she is not seeing things any longer, [name] shakes her head and reminds her she continues to have daily hallucinations, talking to children and minister who are not there. She stays up late, and when asleep she yells in her sleep. They have seen her fight in her sleep. She states that what bothers her is the really hard shake in her hands. It does not happen all the time, but it affects writing and drinking from a cup. [name] states she has not seen any tremors. [name] notes the one time she saw shaking was when she fell, her whole body was shaking. [name] reports her own health issues make it difficult to help the patient, she cannot leave, when she leaves the patient has a fall. They have to call EMS each time she falls, she had 2 falls last week. She reports feeling dizzy, with spinning and lightheaded sensations. [name] also notes some depression, when Child psychotherapist asked why she is not taking her medications, she said she felt like everybody had left her, several family members have passed away. She denies any headaches, dysarthria/dysphagia, neck/back pain, focal numbness/tingling/weakness. She has constipation and occasional urinary incontinence. She states she lost her sense of smell years ago. She reports sleep is good, [name] shakes her head reporting the nightmares and screaming in her sleep. There were memory issues on her father side. She denies  any significant head injuries or alcohol use.     PAST MEDICAL HISTORY: Past Medical History:  Diagnosis Date  . Arthritis   . Diabetes mellitus type II   . GERD (gastroesophageal reflux disease)   . Hyperlipidemia   . Hypertension   . Mitral insufficiency     PAST SURGICAL HISTORY: Past Surgical History:  Procedure Laterality Date  .  ABDOMINAL HYSTERECTOMY     partial   . BACK SURGERY  1979, 1987, 2000  . FRACTURE SURGERY     hip (bilateral)  . JOINT REPLACEMENT    . PARTIAL HYSTERECTOMY    . SPINE SURGERY    . TOTAL HIP ARTHROPLASTY      MEDICATIONS: Current Outpatient Medications on File Prior to Visit  Medication Sig Dispense Refill  . allopurinol (ZYLOPRIM) 100 MG tablet Take 100 mg by mouth daily. Reported on 09/04/2015 (Patient not taking: Reported on 07/23/2020)    . ALPRAZolam (XANAX) 0.25 MG tablet Take 0.25 mg by mouth at bedtime as needed for anxiety. Reported on 09/04/2015 (Patient not taking: Reported on 07/23/2020)    . Besifloxacin HCl 0.6 % SUSP Apply to eye. (Patient not taking: Reported on 07/23/2020)    . Bromfenac Sodium 0.09 % SOLN Apply to eye. (Patient not taking: Reported on 07/23/2020)    . Cholecalciferol (VITAMIN D3) 1.25 MG (50000 UT) TABS Take by mouth. (Patient not taking: Reported on 07/23/2020)    . gabapentin (NEURONTIN) 300 MG capsule Take 300 mg by mouth 2 (two) times daily. Reported on 09/04/2015 (Patient not taking: Reported on 07/23/2020)    . meclizine (ANTIVERT) 25 MG tablet Take 25 mg by mouth 3 (three) times daily as needed for dizziness. (Patient not taking: Reported on 07/23/2020)    . memantine (NAMENDA XR) 14 MG CP24 24 hr capsule Take 14 mg by mouth. (Patient not taking: Reported on 07/23/2020)    . nystatin cream (MYCOSTATIN) APPLY TO AFFECTD AREA TWICE DAILY. (Patient not taking: Reported on 07/23/2020) 30 g prn  . POTASSIUM CHLORIDE ER PO Take 10 mEq by mouth daily. Reported on 09/04/2015 (Patient not taking: Reported on 07/23/2020)    . spironolactone (ALDACTONE) 25 MG tablet Take 25 mg by mouth daily. Reported on 09/04/2015 (Patient not taking: Reported on 07/23/2020)    . traMADol (ULTRAM) 50 MG tablet Take by mouth every 6 (six) hours as needed. Reported on 09/04/2015 (Patient not taking: Reported on 07/23/2020)     No current facility-administered medications on file prior to  visit.    ALLERGIES: Allergies  Allergen Reactions  . Adhesive [Tape]   . Latex     FAMILY HISTORY: Family History  Problem Relation Age of Onset  . Heart attack Father 75  . Heart disease Father        before age 2  . Mitral valve prolapse Daughter   . Stroke Mother   . ALS Brother   . Heart attack Son   . Heart disease Son        before age 8  . Coronary artery disease Other     SOCIAL HISTORY: Social History   Socioeconomic History  . Marital status: Widowed    Spouse name: Not on file  . Number of children: Not on file  . Years of education: Not on file  . Highest education level: Not on file  Occupational History  . Not on file  Tobacco Use  . Smoking status: Former Smoker    Packs/day: 1.50    Types:  Cigarettes  . Smokeless tobacco: Never Used  Vaping Use  . Vaping Use: Never used  Substance and Sexual Activity  . Alcohol use: No  . Drug use: No  . Sexual activity: Not Currently    Birth control/protection: Post-menopausal  Other Topics Concern  . Not on file  Social History Narrative   Right handed    Lives with family    Social Determinants of Health   Financial Resource Strain: Not on file  Food Insecurity: Not on file  Transportation Needs: Not on file  Physical Activity: Not on file  Stress: Not on file  Social Connections: Not on file  Intimate Partner Violence: Not on file     PHYSICAL EXAM: Vitals:   07/23/20 1346  BP: (!) 160/82  Pulse: 98  SpO2: 95%   General: No acute distress Head:  Normocephalic/atraumatic Skin/Extremities: No rash, no edema Neurological Exam: Mental status: alert and oriented to person, city, day of week. No dysarthria or aphasia, there appears to be some confabulation. Fund of knowledge is reduced. Recent and remote memory are impaired. Attention and concentration are reduced.  Able to name objects, difficulty with repetition. [place] score 8/30. Montreal Cognitive Assessment  07/23/2020  Visuospatial/  Executive (0/5) 1  Naming (0/3) 2  Attention: Read list of digits (0/2) 0  Attention: Read list of letters (0/1) 0  Attention: Serial 7 subtraction starting at 100 (0/3) 1  Language: Repeat phrase (0/2) 0  Language : Fluency (0/1) 0  Abstraction (0/2) 0  Delayed Recall (0/5) 2  Orientation (0/6) 2  Total 8  Adjusted Score (based on education) 8    Cranial nerves: CN I: not tested CN II: pupils equal, round and reactive to light, visual fields intact CN III, IV, VI:  full range of motion, no nystagmus, no ptosis CN V: facial sensation intact CN VII: upper and lower face symmetric CN VIII: hearing intact to conversation CN IX, X: gag intact, uvula midline CN XI: sternocleidomastoid and trapezius muscles intact CN XII: tongue midline Bulk & Tone: normal, no fasciculations, no cogwheeling. Motor: 5/5 throughout with no pronator drift. Deep Tendon Reflexes: +2 on both UE, unable to do on patella due to knee pain Cerebellar: no incoordination on finger to nose testing Gait: slow and cautious with cane, reporting knee pain Tremor: none   IMPRESSION: This is an 86 year old right-handed woman with a history of hypertension, hyperlipidemia, diabetes, GERD, presenting for evaluation of memory loss, hallucinations. Her neurological exam is non-focal, MOCA score today 8/30. We discussed the diagnosis of dementia, most likely due to Alzheimer's disease with behavioral disturbance. Lewy body dementia is also a possibility with the hallucinations and REM behavior disorder, however she does not have any other parkinsonian signs on exam. The main issue is that she needs 24/7 care and family is unable to provide this any longer. Discussed with the patient and daughter to start looking into Memory Care facilities. She has stopped taking all her medications, I discussed that Seroquel can be restarted for the hallucinations but daughter has to administer this, however it appears this will be difficult for  [name] to do as well, so we discussed that it appears there is no other recourse but to move to higher level of care. Follow-up as needed, they know to call for any changes.   Thank you for allowing me to participate in the care of this patient. Please do not hesitate to call for any questions or concerns.   Ellouise Newer,  M.D.  CC: Jasmine. [place]

## 2020-07-23 NOTE — Patient Instructions (Signed)
You have a brain condition called dementia, where you now need 24/7 supervision. This is not an option to do at home any longer, recommend looking into moving to Memory Care.

## 2020-07-27 DIAGNOSIS — E782 Mixed hyperlipidemia: Secondary | ICD-10-CM | POA: Diagnosis not present

## 2020-07-27 DIAGNOSIS — E1140 Type 2 diabetes mellitus with diabetic neuropathy, unspecified: Secondary | ICD-10-CM | POA: Diagnosis not present

## 2020-07-27 DIAGNOSIS — E1122 Type 2 diabetes mellitus with diabetic chronic kidney disease: Secondary | ICD-10-CM | POA: Diagnosis not present

## 2020-07-27 DIAGNOSIS — E785 Hyperlipidemia, unspecified: Secondary | ICD-10-CM | POA: Diagnosis not present

## 2020-07-27 DIAGNOSIS — M1000 Idiopathic gout, unspecified site: Secondary | ICD-10-CM | POA: Diagnosis not present

## 2020-07-27 DIAGNOSIS — M109 Gout, unspecified: Secondary | ICD-10-CM | POA: Diagnosis not present

## 2020-07-27 DIAGNOSIS — N189 Chronic kidney disease, unspecified: Secondary | ICD-10-CM | POA: Diagnosis not present

## 2020-07-27 DIAGNOSIS — I129 Hypertensive chronic kidney disease with stage 1 through stage 4 chronic kidney disease, or unspecified chronic kidney disease: Secondary | ICD-10-CM | POA: Diagnosis not present

## 2020-07-27 DIAGNOSIS — I10 Essential (primary) hypertension: Secondary | ICD-10-CM | POA: Diagnosis not present

## 2020-07-31 DIAGNOSIS — I10 Essential (primary) hypertension: Secondary | ICD-10-CM | POA: Diagnosis not present

## 2020-07-31 DIAGNOSIS — G3184 Mild cognitive impairment, so stated: Secondary | ICD-10-CM | POA: Diagnosis not present

## 2020-07-31 DIAGNOSIS — E1140 Type 2 diabetes mellitus with diabetic neuropathy, unspecified: Secondary | ICD-10-CM | POA: Diagnosis not present

## 2020-07-31 DIAGNOSIS — M109 Gout, unspecified: Secondary | ICD-10-CM | POA: Diagnosis not present

## 2020-07-31 DIAGNOSIS — G9009 Other idiopathic peripheral autonomic neuropathy: Secondary | ICD-10-CM | POA: Diagnosis not present

## 2020-07-31 DIAGNOSIS — M7918 Myalgia, other site: Secondary | ICD-10-CM | POA: Diagnosis not present

## 2020-07-31 DIAGNOSIS — E782 Mixed hyperlipidemia: Secondary | ICD-10-CM | POA: Diagnosis not present

## 2020-07-31 DIAGNOSIS — M25559 Pain in unspecified hip: Secondary | ICD-10-CM | POA: Diagnosis not present

## 2020-07-31 DIAGNOSIS — E1122 Type 2 diabetes mellitus with diabetic chronic kidney disease: Secondary | ICD-10-CM | POA: Diagnosis not present

## 2020-08-06 ENCOUNTER — Other Ambulatory Visit: Payer: Self-pay

## 2020-08-06 NOTE — Patient Outreach (Signed)
[place] [facility]) Care Management  08/06/2020  Jasmine Bridges 1933-07-16 [id number]   Telephone Screen  Referral Date: 08/06/2020 Referral Source: MD Office Referral Reason: "SW-help with possible placement" Insurance: [facility]   Outreach attempt #1 to patient/caregiver. Spoke with daughter-[name](DPR on file). She report that she moved in with patient a few years ago. Patient has dementia and is now requiring 24/7 care. She voices she is sole caregiver. Patient has another daughter that lives in [place] but due to work schedule is unable to provide much assistance. Patient requires assistance with ADLs/IADLs. Daughter voices that becoming more difficult for her to provide care to patient due to her own medical issues.  She has had some falls in the home. She does use a walker and cane. Appetite is pretty good. Daughter manages meds for patient. She voices that patient's income is over the Medicaid. She does not have any particular facilities in mind. RN CM encouraged daughter to discuss with sibling and ask around via word of mouth of possible facilities she would like. RN CM briefly explained process for placement. Discussed [facility] SW referral for assistance with process and verbal consent received. Daughter is aware to expect a call from [facility] SW within a few days. She denies any other RN CM needs or concerns at this time.      Plan: RN CM will send [facility] SW referral for possible assistance with placement.    Enzo Montgomery, RN,BSN,CCM [place] Management Telephonic Care Management Coordinator Direct Phone: [phone] Toll Free: [phone] Fax: [phone]

## 2020-08-07 ENCOUNTER — Other Ambulatory Visit: Payer: Self-pay | Admitting: *Deleted

## 2020-08-07 ENCOUNTER — Encounter: Payer: Self-pay | Admitting: *Deleted

## 2020-08-07 NOTE — Patient Outreach (Signed)
[place] [facility]) Care Management  08/07/2020  Jasmine Bridges [birth year] [id number]   CSW made an attempt to try and contact patient's daughter, [name] today, to perform the initial phone assessment on patient, as well as to assess and assist with social work needs and services, but without success.  Before even saying "hello", [name] stated, "I'm going to put you on hold", but then never returned to the call.  CSW waited for several minutes, before finally terminating the call.  CSW made several additional attempts to try and contact [name], but the phone just continued to ring.  CSW was unable to leave a HIPAA compliant message on voicemail.  CSW will continue to try and outreach to [name] today.  Otherwise, CSW will make arrangements to try and outreach to [name] again within the next 3-4 business days, if a return call is not received from her in the meantime.  CSW will also mail a Patient Unsuccessful Outreach Letter to patient and [name] home address, encouraging [name] to contact CSW directly if she is interested in receiving social work services.  [name], BSW, MSW, LCSW  Licensed Education officer, [facility]  Mailing [place] [address] [place], [place] [address] Physical Address-[address] [place], [place] [address] Toll Free Main # [phone] Fax # [phone] Cell # [phone]  [email or web address]

## 2020-08-10 ENCOUNTER — Encounter: Payer: Self-pay | Admitting: *Deleted

## 2020-08-10 ENCOUNTER — Other Ambulatory Visit: Payer: Self-pay | Admitting: *Deleted

## 2020-08-10 NOTE — Patient Outreach (Signed)
[facility]) Care Management  08/10/2020  LETISHA YERA [birth year] [id number]  CSW was able to make initial contact with patient's daughter, [name] today to perform the phone assessment on patient, as well as assess and assist with social work needs and services.  CSW introduced self, explained role and types of services provided through [facility] ([facility]).  CSW further explained to [name] that CSW works with patient's Telephonic RNCM, also with [place] [facility], Kandra Nicolas Florance.  CSW then explained the reason for the call, indicating that Mrs. Florance thought that patient and [name] would benefit from social work services and resources to assist with long-term care placement for patient into an assisted living facility that offers memory care services and the wander guard system.  CSW obtained two HIPAA compliant identifiers from [name], which included patient's name and date of birth.  [name] admitted that she has been trying to provide 24 hour care and supervision to patient in the home, but with her progressing memory deficits, it has become more and more difficult for her to manage patient's care.  [name] reported that several of patient's physicians have recommended that she go ahead and consider placing patient into a long-term care assisted living facility, that offers memory care services and the wander guard system.  CSW agreed to assist with this process, explaining to [name] that she will first need to apply for [place] Medicaid for patient, through the [facility], as patient will require supplemental income support, in addition to her [place].  [name] voiced understanding, agreeing to schedule an appointment with the Department of Social Services next week, weather permitting.    In the meantime, CSW agreed to contact  patient's Primary Care Physician, Dr. Allyn Kenner to request completion of an FL-2 Form.  Once the FL-2 Form is obtained, CSW will fax the form to all assisted living facilities of interest, according to [name], as well as fax a copy to patient's assigned Medicaid case worker for review and consideration of expedited funds.  Per [name] request, CSW will e-mail ([email or web address]) her a blank [place] Medicaid Long-Term Care FL-2 Form, 2021/2022 Medicaid Tips Checklist, [place] Department of Health and Human Services Application for Medicaid, and a complete list of long-term care assisted living facilities in [place], [place] that offer memory care services and have the wander guard system.  [name] voiced understanding and was agreeable to this plan, encouraging CSW to follow-up with her again early next week, preferably on Tuesday, August 14, 2020, around [place], BSW, MSW, [employer]  Licensed Clinical Social Worker  [place]  Mailing [place] [address], [place], [place] [address] Physical Address-[address] [place], [place] [address] Toll Free Main # [phone] Fax # [phone] Cell # [phone]  [email or web address]

## 2020-08-13 ENCOUNTER — Ambulatory Visit: Payer: Medicare HMO | Admitting: *Deleted

## 2020-08-14 ENCOUNTER — Other Ambulatory Visit: Payer: Self-pay | Admitting: *Deleted
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
## 2020-08-17 ENCOUNTER — Ambulatory Visit: Payer: Medicare HMO | Admitting: Podiatry

## 2020-08-22 ENCOUNTER — Other Ambulatory Visit: Payer: Self-pay

## 2020-08-22 ENCOUNTER — Ambulatory Visit: Payer: Medicare HMO | Admitting: Podiatry

## 2020-08-22 DIAGNOSIS — B351 Tinea unguium: Secondary | ICD-10-CM

## 2020-08-22 DIAGNOSIS — M79675 Pain in left toe(s): Secondary | ICD-10-CM

## 2020-08-22 DIAGNOSIS — L853 Xerosis cutis: Secondary | ICD-10-CM

## 2020-08-22 DIAGNOSIS — M79674 Pain in right toe(s): Secondary | ICD-10-CM | POA: Diagnosis not present

## 2020-08-23 ENCOUNTER — Other Ambulatory Visit: Payer: Self-pay | Admitting: *Deleted

## 2020-08-23 ENCOUNTER — Encounter: Payer: Self-pay | Admitting: Podiatry

## 2020-08-23 NOTE — Patient Outreach (Signed)
[facility]) Care Management  08/23/2020  REGANA KEMPLE 06-20-1933 [id number]   CSW was able to make contact with patient's daughter, [name] today, to follow-up regarding long-term care placement arrangements for patient.  CSW inquired as to whether or not [name] has spoken with her sister, [name] to discuss the list of long-term memory care assisted living facilities that [place] e-mailed to her, per her request, which included all memory care facilities in both [place] and [place].  [name] indicated that she and [name] are still reviewing the lists, unable to provide CSW with any facilities of interest, at present.  CSW reminded [name] that patient's current FL-2 Form is only valid for 30 days, before CSW will need to request that a new Form be completed by patient's Primary Care Physician, Dr. Allyn Kenner.      CSW encouraged [name] to try and narrow down her choices of facilities within the next 72 hours, agreeing to follow-up with her again on Monday, August 27, 2020, around 9:00AM, to have her report findings.  [name] voiced understanding and was agreeable to this plan.  CSW explained to [name] that once she is able to provide CSW with her list of preferred facilities, CSW will be able to fax patient's FL-2 Form to the Admissions Department of these facilities, to try and pursue bed offers.  [name] admitted that she still has not had an opportunity to contact anyone at the [facility] to schedule an appointment to complete patient's [place] Medicaid application, but hopes to be able to do so within the next 5 business days.   [name], BSW, MSW, LCSW  Licensed Education officer, environmental Health System  Mailing [place] [address] [place], [place] [address] Physical Address-[address] [place], [place] [address] Toll  Free Main # [phone] Fax # [phone] Cell # [phone]  [email or web address]

## 2020-08-23 NOTE — Progress Notes (Signed)
  Subjective:  Patient ID: Jasmine Bridges, female    DOB: 1933-04-18,  MRN: [id number]  Chief Complaint  Patient presents with  . routine foot care    Nail trim    87 y.o. female returns for the above complaint.  Patient presents with complaint of thickened elongated dystrophic toenails x10.  Patient states is painful to touch.  She states that she would like to have them debrided down.  She has not been seen by anyone else prior to seeing me.  She denies any other acute complaints.  Objective:  There were no vitals filed for this visit. Podiatric Exam: Vascular: dorsalis pedis and posterior tibial pulses are palpable bilateral. Capillary return is immediate. Temperature gradient is WNL. Skin turgor WNL  Sensorium: Normal Semmes Weinstein monofilament test. Normal tactile sensation bilaterally. Nail Exam: Pt has thick disfigured discolored nails with subungual debris noted bilateral entire nail hallux through fifth toenails.  Pain on palpation to the nails. Ulcer Exam: There is no evidence of ulcer or pre-ulcerative changes or infection. Orthopedic Exam: Muscle tone and strength are WNL. No limitations in general ROM. No crepitus or effusions noted. HAV  B/L.  Hammer toes 2-5  B/L. Skin: No Porokeratosis. No infection or ulcers.  Dry skin/xerosis noted to bilateral lower extremity without itching    Assessment & Plan:   1. Xerosis cutis   2. Pain due to onychomycosis of toenails of both feet     Patient was evaluated and treated and all questions answered.  Xerosis bilateral lower extremity -I explained to the patient the etiology of xerosis and various treatment options were extensively discussed.  I explained to the patient the importance of maintaining moisturization of the skin with application of over-the-counter lotion such as Eucerin or Luciderm.  I have asked the patient to apply this twice a day.  If unable to resolve patient will benefit from prescription  lotion.  Onychomycosis with pain  -Nails palliatively debrided as below. -Educated on self-care  Procedure: Nail Debridement Rationale: pain  Type of Debridement: manual, sharp debridement. Instrumentation: Nail nipper, rotary burr. Number of Nails: 10  Procedures and Treatment: Consent by patient was obtained for treatment procedures. The patient understood the discussion of treatment and procedures well. All questions were answered thoroughly reviewed. Debridement of mycotic and hypertrophic toenails, 1 through 5 bilateral and clearing of subungual debris. No ulceration, no infection noted.  Return Visit-Office Procedure: Patient instructed to return to the office for a follow up visit 3 months for continued evaluation and treatment.  Boneta Lucks, DPM    No follow-ups on file.

## 2020-08-25 DIAGNOSIS — E1140 Type 2 diabetes mellitus with diabetic neuropathy, unspecified: Secondary | ICD-10-CM | POA: Diagnosis not present

## 2020-08-25 DIAGNOSIS — E782 Mixed hyperlipidemia: Secondary | ICD-10-CM | POA: Diagnosis not present

## 2020-08-25 DIAGNOSIS — M1000 Idiopathic gout, unspecified site: Secondary | ICD-10-CM | POA: Diagnosis not present

## 2020-08-25 DIAGNOSIS — I10 Essential (primary) hypertension: Secondary | ICD-10-CM | POA: Diagnosis not present

## 2020-08-25 DIAGNOSIS — E785 Hyperlipidemia, unspecified: Secondary | ICD-10-CM | POA: Diagnosis not present

## 2020-08-25 DIAGNOSIS — E1122 Type 2 diabetes mellitus with diabetic chronic kidney disease: Secondary | ICD-10-CM | POA: Diagnosis not present

## 2020-08-25 DIAGNOSIS — N189 Chronic kidney disease, unspecified: Secondary | ICD-10-CM | POA: Diagnosis not present

## 2020-08-25 DIAGNOSIS — I129 Hypertensive chronic kidney disease with stage 1 through stage 4 chronic kidney disease, or unspecified chronic kidney disease: Secondary | ICD-10-CM | POA: Diagnosis not present

## 2020-08-25 DIAGNOSIS — M109 Gout, unspecified: Secondary | ICD-10-CM | POA: Diagnosis not present

## 2020-08-27 ENCOUNTER — Other Ambulatory Visit: Payer: Self-pay | Admitting: *Deleted

## 2020-08-27 NOTE — Patient Outreach (Signed)
[facility]) Care Management  08/27/2020  Jasmine Bridges 11/25/1932 [id number]   CSW was able to make contact with patient's daughter, [name] today, to follow-up regarding long-term care placement arrangements for patient, into a memory care assisted living facility that offers memory care services and the wander-guard system.  [name] admitted that she still has not decided on any facilities of interest, but has come to the realization that her mother will definitely require placement into a memory care assisted living facility.  CSW provided [name] with a specific list of facilities, in [place] and [place], that meet these requirements, in addition to accepting [place] Medicaid.  CSW reminded [name] that bed availability is extremely limited right now, for both skilled and assisted living facilities, due to COVID-19 and various other reasons.    CSW explained to [name] that [facility] receives a daily updated list of facilities with bed availability, in all the surrounding counties ([place], [place], [place], [place], [place], etc.), but as of today, the only facility that currently has a female long-term care Medicaid bed available is [facility].  [name] indicated that she is familiar with this facility, and did not wish to pursue a bed offer.  CSW was able to assist [name] with completion of patient's [place] Medicaid application, encouraging her to either drop it off at the [facility] ([place]) for processing, or mail, fax, or e-mail it in, providing her with specific instructions and contact information for each.  [name] indicated that she will e-mail the application to CSW for submission.  Within the next week, [name] agreed to provide CSW with at least 4 facilities of interest, so that [name] can fax patient's FL-2 Form to  these facilities, before the Form expires, to try and secure a bed offer.  Due to limited bed availability, CSW will request that each admissions department hold patient's FL-2 Form for at least 3 months, placing patient on their waiting list for bed availability.  [name] encouraged CSW to follow-up with her again next week, on Tuesday, September 04, 2020, around 9:00AM.  In the meantime, [name] agreed to gather all necessary documents required to submit with patient's [place] Medicaid application, then e-mail to CSW for submission.  As soon as CSW receives the e-mail with the completed and signed application, and all required documents, CSW will fax, as well as mail, everything directly to DSS.  [name], BSW, MSW, LCSW  Licensed Education officer, environmental Health System  Mailing [place] [address], [place], [place] [address] Physical Address-[address] [place], [place] [address] Toll Free Main # [phone] Fax # [phone] Cell # [phone]  [email or web address]

## 2020-09-04 ENCOUNTER — Other Ambulatory Visit: Payer: Self-pay | Admitting: *Deleted

## 2020-09-04 NOTE — Patient Outreach (Signed)
[facility]) Care Management  09/04/2020  ALICYA BENA 1933-03-27 [id number]   CSW made an attempt to try and contact patient's daughter, [name] today, to follow-up regarding long-term care placement arrangements for patient; however, [name] was unavailable at the time of CSW's call.  CSW left a HIPAA compliant message on voicemail for [name] and is currently awaiting a return call.  CSW will make a second outreach attempt within the next 3-4 business days, if a return call is not received from [name] in the meantime.  [name], BSW, MSW, LCSW  Licensed Education officer, environmental Health System  Mailing [place] [address] [place], [place] [address] Physical Address-[address], [place], [place] [address] Toll Free Main # [phone] Fax # [phone] Cell # [phone]  [email or web address]

## 2020-09-10 ENCOUNTER — Encounter: Payer: Self-pay | Admitting: *Deleted

## 2020-09-10 ENCOUNTER — Other Ambulatory Visit: Payer: Self-pay | Admitting: *Deleted

## 2020-09-10 NOTE — Patient Outreach (Signed)
[facility]) Care Management  09/10/2020  Jasmine Bridges 1933-03-14 [id number]  CSW was able to make contact with patient's daughter and primary caregiver, Jasmine Bridges today, to follow-up regarding long-term care placement arrangements for patient into an assisted living facility with memory care services and the wander-guard system.  [name] admitted that she has decided to hold off on placing patient into a long-term care facility at this time, concerned about all the recent COVID-19 outbreaks among residents and staff within local facilities.  [name] went on to explain that she has not been able to complete the [place] Medicaid application for patient, e-mailed to her by CSW several weeks ago, as she does not have access to a printer.  CSW agreed to mail [name] a 2021 Medicaid Tips Sheet, as well as a Special [place] Long-Term Care Medicaid application.  In conversing with [name], CSW learned that patient's late husband was actually a [place], serving during war time, and receiving an honorable discharge.  CSW then spoke with [name] at length about applying for Aid and Attendance Benefits, through [employer], agreeing to also mail her the following list of information and applications: How to Apply for Aid and Attendance - [place], Physician Statement - VA, Authorization to [employer] Personal Information to a Third Party - [place], and Dispensing optician in [place].  Last, CSW will mail [name] the following list of agencies and providers, contracted through the [place]: [place], [place], and [place] Service Providers.  [name] was agreeable to having CSW follow-up with her again next week, on Thursday, September 20, 2020, at [place], [place], MSW, [employer]  Licensed Clinical Social Worker  [place]  Mailing  [place] N. [address] [place], [place] [address] Physical Address-[address] [place], [place] [address] Toll Free Main # [phone] Fax # [phone] Cell # [phone]  [email or web address]

## 2020-09-20 ENCOUNTER — Other Ambulatory Visit: Payer: Self-pay | Admitting: *Deleted

## 2020-09-20 NOTE — Patient Outreach (Signed)
[name]) Care Management  09/20/2020  SAADIYA WILFONG 24-Dec-1932 [id number]   CSW made an attempt to try and contact patient's daughter/primary caregiver, [name] today, to follow-up regarding social work services and resources for patient; however, [name] was unavailable at the time of CSW's call.  A HIPAA compliant message was left for [name] on [employer], as CSW awaits a return call.  CSW will make a second outreach attempt within the next 3-4 business days, if a return call is not received from [name] in the meantime.    [name], BSW, MSW, LCSW  Licensed Education officer, environmental Health System  Mailing [name] [address] [place], [place] [address] Physical Address-[address] [place], [place] [address] Toll Free Main # [phone] Fax # [phone] Cell # [phone]  [email or web address]

## 2020-09-24 DIAGNOSIS — G3184 Mild cognitive impairment, so stated: Secondary | ICD-10-CM | POA: Diagnosis not present

## 2020-09-24 DIAGNOSIS — M109 Gout, unspecified: Secondary | ICD-10-CM | POA: Diagnosis not present

## 2020-09-24 DIAGNOSIS — E782 Mixed hyperlipidemia: Secondary | ICD-10-CM | POA: Diagnosis not present

## 2020-09-24 DIAGNOSIS — R296 Repeated falls: Secondary | ICD-10-CM | POA: Diagnosis not present

## 2020-09-24 DIAGNOSIS — I10 Essential (primary) hypertension: Secondary | ICD-10-CM | POA: Diagnosis not present

## 2020-09-24 DIAGNOSIS — M7918 Myalgia, other site: Secondary | ICD-10-CM | POA: Diagnosis not present

## 2020-09-24 DIAGNOSIS — M25559 Pain in unspecified hip: Secondary | ICD-10-CM | POA: Diagnosis not present

## 2020-09-24 DIAGNOSIS — G9009 Other idiopathic peripheral autonomic neuropathy: Secondary | ICD-10-CM | POA: Diagnosis not present

## 2020-09-24 DIAGNOSIS — E1122 Type 2 diabetes mellitus with diabetic chronic kidney disease: Secondary | ICD-10-CM | POA: Diagnosis not present

## 2020-09-26 ENCOUNTER — Other Ambulatory Visit: Payer: Self-pay | Admitting: *Deleted

## 2020-09-26 NOTE — Patient Outreach (Addendum)
[facility]) Care Management  09/26/2020  Jasmine Bridges March 02, 1933 [id number]  CSW was able to make contact with patient's daughter, Jasmine Bridges today, to follow-up regarding patient's application for Adult Medicaid.  Jasmine Bridges admitted that she is still waiting to speak with an elder law attorney about patient's finances/savings before submitting the application to the [place] for processing.  Jasmine Bridges denied having any new information to report, encouraging CSW to contact her again in 2 weeks, on Wednesday, October 10, 2020 at 11:00am.  [name], BSW, MSW, [place]  Licensed Clinical Social Worker  [place]  Mailing [place] [address], [place], [place] [address] Physical Address-[address] [place], [place] [address] Toll Free Main # [phone] Fax # [phone] Cell # [phone]  [email or web address]

## 2020-10-10 ENCOUNTER — Encounter: Payer: Self-pay | Admitting: *Deleted

## 2020-10-10 ENCOUNTER — Other Ambulatory Visit: Payer: Self-pay | Admitting: *Deleted

## 2020-10-10 NOTE — Patient Outreach (Signed)
[facility]) Care Management  10/10/2020  Jasmine Bridges 13-Dec-1932 [id number]   CSW was able to make contact with patient's daughter, Jasmine Bridges today, to follow-up regarding social work services and resources for patient.  [name] admitted that she has decided not to apply for Aid and Attendance Benefits through [employer].  In addition, [name] declined wanting/needing assistance with applying for Adult Medicaid, and/or [place] Medicaid, through the [place], as she believes that patient will definitely be "well over-qualified".  [name] went on to explain that she currently has a joint banking account with patient, that has a substantial amount of money in it, from both patient and Ms. Adam's combined monthly income.    Patient's home, and all of her other assets, were put into her children's names in 2002, but there is no way to avoid the fact that patient currently has more than the allotted $3,000.00 in her banking account.  Ms. Baruch Merl is aware that the money cannot be transferred, deeded, gifted, loaned, etc. to anyone, as there is a "10 year look-back period" for all government agencies offering financial assistance.  [name] reported that she is committed to continuing to care for patient in the home, providing 24 hour care and supervision, as well as assistance with ADL's/IADL's.  CSW will perform a case closure on patient, as all goals of treatment have been met from social work standpoint and no additional social work needs have been identified at this time.  CSW will fax an update to patient's Primary Care Physician, Dr. Allyn Kenner to ensure that he is aware of CSW's involvement with patient's plan of care, in addition to routing a Physician Case Closure Letter.  CSW was able to confirm that [name] has the correct contact information for CSW, encouraging her to contact CSW directly if  additional social work needs arise in the near future.  [name] voiced understanding and was agreeable to this plan.  [name], BSW, MSW, LCSW  Licensed Education officer, environmental Health System  Mailing [place] [address] [place], [place] [address] Physical Address-[address] [place] [address] [place], [place] [address] Toll Free Main # [phone] Fax # [phone] Cell # [phone]  [email or web address]

## 2020-10-22 ENCOUNTER — Encounter: Payer: Self-pay | Admitting: *Deleted

## 2020-10-24 DIAGNOSIS — E1122 Type 2 diabetes mellitus with diabetic chronic kidney disease: Secondary | ICD-10-CM | POA: Diagnosis not present

## 2020-10-24 DIAGNOSIS — M25559 Pain in unspecified hip: Secondary | ICD-10-CM | POA: Diagnosis not present

## 2020-10-24 DIAGNOSIS — M7918 Myalgia, other site: Secondary | ICD-10-CM | POA: Diagnosis not present

## 2020-10-24 DIAGNOSIS — G3184 Mild cognitive impairment, so stated: Secondary | ICD-10-CM | POA: Diagnosis not present

## 2020-10-24 DIAGNOSIS — I10 Essential (primary) hypertension: Secondary | ICD-10-CM | POA: Diagnosis not present

## 2020-10-24 DIAGNOSIS — G9009 Other idiopathic peripheral autonomic neuropathy: Secondary | ICD-10-CM | POA: Diagnosis not present

## 2020-10-24 DIAGNOSIS — E782 Mixed hyperlipidemia: Secondary | ICD-10-CM | POA: Diagnosis not present

## 2020-10-24 DIAGNOSIS — G8929 Other chronic pain: Secondary | ICD-10-CM | POA: Diagnosis not present

## 2020-10-24 DIAGNOSIS — M109 Gout, unspecified: Secondary | ICD-10-CM | POA: Diagnosis not present

## 2020-10-31 DIAGNOSIS — E1122 Type 2 diabetes mellitus with diabetic chronic kidney disease: Secondary | ICD-10-CM | POA: Diagnosis not present

## 2020-10-31 DIAGNOSIS — E1140 Type 2 diabetes mellitus with diabetic neuropathy, unspecified: Secondary | ICD-10-CM | POA: Diagnosis not present

## 2020-10-31 DIAGNOSIS — M109 Gout, unspecified: Secondary | ICD-10-CM | POA: Diagnosis not present

## 2020-10-31 DIAGNOSIS — M25559 Pain in unspecified hip: Secondary | ICD-10-CM | POA: Diagnosis not present

## 2020-10-31 DIAGNOSIS — G3184 Mild cognitive impairment, so stated: Secondary | ICD-10-CM | POA: Diagnosis not present

## 2020-10-31 DIAGNOSIS — E782 Mixed hyperlipidemia: Secondary | ICD-10-CM | POA: Diagnosis not present

## 2020-10-31 DIAGNOSIS — G9009 Other idiopathic peripheral autonomic neuropathy: Secondary | ICD-10-CM | POA: Diagnosis not present

## 2020-10-31 DIAGNOSIS — I10 Essential (primary) hypertension: Secondary | ICD-10-CM | POA: Diagnosis not present

## 2020-10-31 DIAGNOSIS — M7918 Myalgia, other site: Secondary | ICD-10-CM | POA: Diagnosis not present

## 2020-11-21 ENCOUNTER — Other Ambulatory Visit: Payer: Self-pay

## 2020-11-21 ENCOUNTER — Ambulatory Visit (INDEPENDENT_AMBULATORY_CARE_PROVIDER_SITE_OTHER): Payer: Medicare HMO | Admitting: Podiatry

## 2020-11-21 ENCOUNTER — Encounter: Payer: Self-pay | Admitting: Podiatry

## 2020-11-21 DIAGNOSIS — B351 Tinea unguium: Secondary | ICD-10-CM

## 2020-11-21 DIAGNOSIS — M79674 Pain in right toe(s): Secondary | ICD-10-CM

## 2020-11-21 DIAGNOSIS — R601 Generalized edema: Secondary | ICD-10-CM | POA: Diagnosis not present

## 2020-11-21 DIAGNOSIS — M79675 Pain in left toe(s): Secondary | ICD-10-CM | POA: Diagnosis not present

## 2020-11-21 NOTE — Progress Notes (Signed)
  Subjective:  Patient ID: Jasmine Bridges, female    DOB: 04-08-1933,  MRN: [id number]  Chief Complaint  Patient presents with  . Nail Problem    3 month f/u nail trim;nail dystrophy    87 y.o. female returns for the above complaint.  Patient presents with complaint of thickened elongated dystrophic toenails x10.  Patient states is painful to touch.  She states that she would like to have them debrided down.  She has not been seen by anyone else prior to seeing me.  She has secondary complaint of generalized edema to both lower extremity.  Patient states gets better at night and then when she wakes up in the morning starts walking gets worse.  She would like to discuss treatment options for it.  She does not do anything for it.  Objective:  There were no vitals filed for this visit. Podiatric Exam: Vascular: dorsalis pedis and posterior tibial pulses are palpable bilateral. Capillary return is immediate. Temperature gradient is WNL. Skin turgor WNL  Sensorium: Normal Semmes Weinstein monofilament test. Normal tactile sensation bilaterally. Nail Exam: Pt has thick disfigured discolored nails with subungual debris noted bilateral entire nail hallux through fifth toenails.  Pain on palpation to the nails. Ulcer Exam: There is no evidence of ulcer or pre-ulcerative changes or infection. Orthopedic Exam: Muscle tone and strength are WNL. No limitations in general ROM. No crepitus or effusions noted. HAV  B/L.  Hammer toes 2-5  B/L. Skin: No Porokeratosis. No infection or ulcers.  Dry skin/xerosis noted to bilateral lower extremity which is improving.  Bilateral lower extremity edema noted with 1+ pitting.    Assessment & Plan:   1. Generalized edema   2. Pain due to onychomycosis of toenails of both feet     Patient was evaluated and treated and all questions answered.  Generalized edema to both lower extremity 1+ pitting -I explained to patient the etiology of edema and various treatment  options were discussed.  I discussed with her that given that she states that usually dependent position she needs to elevate her foot as well as wear compression socks to help with it.  She states understanding will do so.  Xerosis bilateral lower extremity -Clinically improving  Onychomycosis with pain  -Nails palliatively debrided as below. -Educated on self-care  Procedure: Nail Debridement Rationale: pain  Type of Debridement: manual, sharp debridement. Instrumentation: Nail nipper, rotary burr. Number of Nails: 10  Procedures and Treatment: Consent by patient was obtained for treatment procedures. The patient understood the discussion of treatment and procedures well. All questions were answered thoroughly reviewed. Debridement of mycotic and hypertrophic toenails, 1 through 5 bilateral and clearing of subungual debris. No ulceration, no infection noted.  Return Visit-Office Procedure: Patient instructed to return to the office for a follow up visit 3 months for continued evaluation and treatment.  Boneta Lucks, DPM    No follow-ups on file.

## 2020-11-25 DIAGNOSIS — G609 Hereditary and idiopathic neuropathy, unspecified: Secondary | ICD-10-CM | POA: Diagnosis not present

## 2020-11-25 DIAGNOSIS — N1831 Chronic kidney disease, stage 3a: Secondary | ICD-10-CM | POA: Diagnosis not present

## 2020-11-25 DIAGNOSIS — M810 Age-related osteoporosis without current pathological fracture: Secondary | ICD-10-CM | POA: Diagnosis not present

## 2020-11-25 DIAGNOSIS — M1009 Idiopathic gout, multiple sites: Secondary | ICD-10-CM | POA: Diagnosis not present

## 2020-11-25 DIAGNOSIS — E1122 Type 2 diabetes mellitus with diabetic chronic kidney disease: Secondary | ICD-10-CM | POA: Diagnosis not present

## 2020-11-25 DIAGNOSIS — I10 Essential (primary) hypertension: Secondary | ICD-10-CM | POA: Diagnosis not present

## 2020-11-25 DIAGNOSIS — F0391 Unspecified dementia with behavioral disturbance: Secondary | ICD-10-CM | POA: Diagnosis not present

## 2020-11-25 DIAGNOSIS — G4700 Insomnia, unspecified: Secondary | ICD-10-CM | POA: Diagnosis not present

## 2020-11-25 DIAGNOSIS — I129 Hypertensive chronic kidney disease with stage 1 through stage 4 chronic kidney disease, or unspecified chronic kidney disease: Secondary | ICD-10-CM | POA: Diagnosis not present

## 2020-11-25 DIAGNOSIS — E782 Mixed hyperlipidemia: Secondary | ICD-10-CM | POA: Diagnosis not present

## 2020-12-07 DIAGNOSIS — E1122 Type 2 diabetes mellitus with diabetic chronic kidney disease: Secondary | ICD-10-CM | POA: Diagnosis not present

## 2020-12-07 DIAGNOSIS — E782 Mixed hyperlipidemia: Secondary | ICD-10-CM | POA: Diagnosis not present

## 2020-12-07 DIAGNOSIS — I10 Essential (primary) hypertension: Secondary | ICD-10-CM | POA: Diagnosis not present

## 2020-12-07 DIAGNOSIS — M810 Age-related osteoporosis without current pathological fracture: Secondary | ICD-10-CM | POA: Diagnosis not present

## 2020-12-12 DIAGNOSIS — F0391 Unspecified dementia with behavioral disturbance: Secondary | ICD-10-CM | POA: Diagnosis not present

## 2020-12-12 DIAGNOSIS — E1122 Type 2 diabetes mellitus with diabetic chronic kidney disease: Secondary | ICD-10-CM | POA: Diagnosis not present

## 2020-12-12 DIAGNOSIS — N1831 Chronic kidney disease, stage 3a: Secondary | ICD-10-CM | POA: Diagnosis not present

## 2020-12-12 DIAGNOSIS — G609 Hereditary and idiopathic neuropathy, unspecified: Secondary | ICD-10-CM | POA: Diagnosis not present

## 2020-12-12 DIAGNOSIS — M1009 Idiopathic gout, multiple sites: Secondary | ICD-10-CM | POA: Diagnosis not present

## 2020-12-12 DIAGNOSIS — G4700 Insomnia, unspecified: Secondary | ICD-10-CM | POA: Diagnosis not present

## 2020-12-12 DIAGNOSIS — E782 Mixed hyperlipidemia: Secondary | ICD-10-CM | POA: Diagnosis not present

## 2020-12-12 DIAGNOSIS — I10 Essential (primary) hypertension: Secondary | ICD-10-CM | POA: Diagnosis not present

## 2020-12-12 DIAGNOSIS — M810 Age-related osteoporosis without current pathological fracture: Secondary | ICD-10-CM | POA: Diagnosis not present

## 2020-12-25 DIAGNOSIS — I129 Hypertensive chronic kidney disease with stage 1 through stage 4 chronic kidney disease, or unspecified chronic kidney disease: Secondary | ICD-10-CM | POA: Diagnosis not present

## 2020-12-25 DIAGNOSIS — I10 Essential (primary) hypertension: Secondary | ICD-10-CM | POA: Diagnosis not present

## 2020-12-26 ENCOUNTER — Encounter (HOSPITAL_COMMUNITY): Payer: Self-pay | Admitting: Emergency Medicine

## 2020-12-26 ENCOUNTER — Emergency Department (HOSPITAL_COMMUNITY): Payer: Medicare HMO

## 2020-12-26 ENCOUNTER — Other Ambulatory Visit: Payer: Self-pay

## 2020-12-26 ENCOUNTER — Observation Stay (HOSPITAL_COMMUNITY)
Admission: EM | Admit: 2020-12-26 | Discharge: 2020-12-28 | Disposition: A | Discharge status: Home / Self Care | Payer: Medicare HMO | Attending: Internal Medicine | Admitting: Internal Medicine

## 2020-12-26 DIAGNOSIS — Z043 Encounter for examination and observation following other accident: Secondary | ICD-10-CM | POA: Diagnosis not present

## 2020-12-26 DIAGNOSIS — J342 Deviated nasal septum: Secondary | ICD-10-CM | POA: Diagnosis not present

## 2020-12-26 DIAGNOSIS — Z79899 Other long term (current) drug therapy: Secondary | ICD-10-CM | POA: Diagnosis not present

## 2020-12-26 DIAGNOSIS — S12500A Unspecified displaced fracture of sixth cervical vertebra, initial encounter for closed fracture: Secondary | ICD-10-CM | POA: Diagnosis not present

## 2020-12-26 DIAGNOSIS — S0181XA Laceration without foreign body of other part of head, initial encounter: Secondary | ICD-10-CM | POA: Diagnosis not present

## 2020-12-26 DIAGNOSIS — W19XXXA Unspecified fall, initial encounter: Secondary | ICD-10-CM | POA: Diagnosis not present

## 2020-12-26 DIAGNOSIS — E1122 Type 2 diabetes mellitus with diabetic chronic kidney disease: Secondary | ICD-10-CM | POA: Diagnosis not present

## 2020-12-26 DIAGNOSIS — Z96643 Presence of artificial hip joint, bilateral: Secondary | ICD-10-CM | POA: Diagnosis not present

## 2020-12-26 DIAGNOSIS — M6282 Rhabdomyolysis: Secondary | ICD-10-CM | POA: Diagnosis not present

## 2020-12-26 DIAGNOSIS — Z981 Arthrodesis status: Secondary | ICD-10-CM | POA: Diagnosis not present

## 2020-12-26 DIAGNOSIS — I6529 Occlusion and stenosis of unspecified carotid artery: Secondary | ICD-10-CM | POA: Diagnosis not present

## 2020-12-26 DIAGNOSIS — M25512 Pain in left shoulder: Secondary | ICD-10-CM | POA: Diagnosis not present

## 2020-12-26 DIAGNOSIS — Z91040 Latex allergy status: Secondary | ICD-10-CM | POA: Diagnosis not present

## 2020-12-26 DIAGNOSIS — M79606 Pain in leg, unspecified: Secondary | ICD-10-CM

## 2020-12-26 DIAGNOSIS — E876 Hypokalemia: Secondary | ICD-10-CM | POA: Insufficient documentation

## 2020-12-26 DIAGNOSIS — N189 Chronic kidney disease, unspecified: Secondary | ICD-10-CM | POA: Diagnosis not present

## 2020-12-26 DIAGNOSIS — Y92009 Unspecified place in unspecified non-institutional (private) residence as the place of occurrence of the external cause: Secondary | ICD-10-CM | POA: Insufficient documentation

## 2020-12-26 DIAGNOSIS — I672 Cerebral atherosclerosis: Secondary | ICD-10-CM | POA: Diagnosis not present

## 2020-12-26 DIAGNOSIS — F0390 Unspecified dementia without behavioral disturbance: Secondary | ICD-10-CM | POA: Insufficient documentation

## 2020-12-26 DIAGNOSIS — M542 Cervicalgia: Secondary | ICD-10-CM | POA: Diagnosis not present

## 2020-12-26 DIAGNOSIS — S0990XA Unspecified injury of head, initial encounter: Secondary | ICD-10-CM | POA: Diagnosis not present

## 2020-12-26 DIAGNOSIS — S199XXA Unspecified injury of neck, initial encounter: Secondary | ICD-10-CM | POA: Diagnosis present

## 2020-12-26 DIAGNOSIS — M79603 Pain in arm, unspecified: Secondary | ICD-10-CM | POA: Diagnosis not present

## 2020-12-26 DIAGNOSIS — Z20822 Contact with and (suspected) exposure to covid-19: Secondary | ICD-10-CM | POA: Diagnosis not present

## 2020-12-26 DIAGNOSIS — W06XXXA Fall from bed, initial encounter: Secondary | ICD-10-CM | POA: Insufficient documentation

## 2020-12-26 DIAGNOSIS — Y900 Blood alcohol level of less than 20 mg/100 ml: Secondary | ICD-10-CM | POA: Insufficient documentation

## 2020-12-26 DIAGNOSIS — M1611 Unilateral primary osteoarthritis, right hip: Secondary | ICD-10-CM | POA: Diagnosis not present

## 2020-12-26 DIAGNOSIS — E119 Type 2 diabetes mellitus without complications: Secondary | ICD-10-CM

## 2020-12-26 DIAGNOSIS — I10 Essential (primary) hypertension: Secondary | ICD-10-CM | POA: Diagnosis not present

## 2020-12-26 DIAGNOSIS — M79602 Pain in left arm: Secondary | ICD-10-CM | POA: Diagnosis not present

## 2020-12-26 DIAGNOSIS — S12400A Unspecified displaced fracture of fifth cervical vertebra, initial encounter for closed fracture: Principal | ICD-10-CM | POA: Insufficient documentation

## 2020-12-26 DIAGNOSIS — Z87891 Personal history of nicotine dependence: Secondary | ICD-10-CM | POA: Insufficient documentation

## 2020-12-26 DIAGNOSIS — M2578 Osteophyte, vertebrae: Secondary | ICD-10-CM | POA: Diagnosis present

## 2020-12-26 DIAGNOSIS — S80919A Unspecified superficial injury of unspecified knee, initial encounter: Secondary | ICD-10-CM | POA: Diagnosis not present

## 2020-12-26 DIAGNOSIS — M47816 Spondylosis without myelopathy or radiculopathy, lumbar region: Secondary | ICD-10-CM | POA: Diagnosis not present

## 2020-12-26 DIAGNOSIS — I129 Hypertensive chronic kidney disease with stage 1 through stage 4 chronic kidney disease, or unspecified chronic kidney disease: Secondary | ICD-10-CM | POA: Insufficient documentation

## 2020-12-26 DIAGNOSIS — M533 Sacrococcygeal disorders, not elsewhere classified: Secondary | ICD-10-CM | POA: Diagnosis not present

## 2020-12-26 DIAGNOSIS — R52 Pain, unspecified: Secondary | ICD-10-CM | POA: Diagnosis not present

## 2020-12-26 LAB — COMPREHENSIVE METABOLIC PANEL
ALT: 12 U/L (ref 0–44)
AST: 28 U/L (ref 15–41)
Albumin: 3.6 g/dL (ref 3.5–5.0)
Alkaline Phosphatase: 57 U/L (ref 38–126)
Anion gap: 9 (ref 5–15)
BUN: 19 mg/dL (ref 8–23)
CO2: 23 mmol/L (ref 22–32)
Calcium: 9 mg/dL (ref 8.9–10.3)
Chloride: 105 mmol/L (ref 98–111)
Creatinine, Ser: 1.17 mg/dL — ABNORMAL HIGH (ref 0.44–1.00)
GFR, Estimated: 46 mL/min — ABNORMAL LOW (ref >60)
Glucose, Bld: 156 mg/dL — ABNORMAL HIGH (ref 70–99)
Potassium: 3.1 mmol/L — ABNORMAL LOW (ref 3.5–5.1)
Sodium: 137 mmol/L (ref 135–145)
Total Bilirubin: 0.7 mg/dL (ref 0.3–1.2)
Total Protein: 6.9 g/dL (ref 6.5–8.1)

## 2020-12-26 LAB — CBC WITH DIFFERENTIAL/PLATELET
Abs Immature Granulocytes: 0.04 10*3/uL (ref 0.00–0.07)
Basophils Absolute: 0 10*3/uL (ref 0.0–0.1)
Basophils Relative: 0 %
Eosinophils Absolute: 0 10*3/uL (ref 0.0–0.5)
Eosinophils Relative: 0 %
HCT: 36.9 % (ref 36.0–46.0)
Hemoglobin: 12.5 g/dL (ref 12.0–15.0)
Immature Granulocytes: 0 %
Lymphocytes Relative: 10 %
Lymphs Abs: 1 10*3/uL (ref 0.7–4.0)
MCH: 30.7 pg (ref 26.0–34.0)
MCHC: 33.9 g/dL (ref 30.0–36.0)
MCV: 90.7 fL (ref 80.0–100.0)
Monocytes Absolute: 0.8 10*3/uL (ref 0.1–1.0)
Monocytes Relative: 8 %
Neutro Abs: 7.5 10*3/uL (ref 1.7–7.7)
Neutrophils Relative %: 82 %
Platelets: 181 10*3/uL (ref 150–400)
RBC: 4.07 MIL/uL (ref 3.87–5.11)
RDW: 13.4 % (ref 11.5–15.5)
WBC: 9.4 10*3/uL (ref 4.0–10.5)
nRBC: 0 % (ref 0.0–0.2)

## 2020-12-26 LAB — PROTIME-INR
INR: 1 (ref 0.8–1.2)
Prothrombin Time: 13.2 seconds (ref 11.4–15.2)

## 2020-12-26 LAB — RESP PANEL BY RT-PCR (FLU A&B, COVID) ARPGX2
Influenza A by PCR: NEGATIVE
Influenza B by PCR: NEGATIVE
SARS Coronavirus 2 by RT PCR: NEGATIVE

## 2020-12-26 LAB — CK
Total CK: 834 U/L — ABNORMAL HIGH (ref 38–234)
Total CK: 1178 U/L — ABNORMAL HIGH (ref 38–234)

## 2020-12-26 LAB — MAGNESIUM: Magnesium: 1.9 mg/dL (ref 1.7–2.4)

## 2020-12-26 LAB — GLUCOSE, CAPILLARY: Glucose-Capillary: 99 mg/dL (ref 70–99)

## 2020-12-26 LAB — ETHANOL: Alcohol, Ethyl (B): <10 mg/dL (ref <10)

## 2020-12-26 MED ORDER — ACETAMINOPHEN 325 MG PO TABS
650.0000 mg | ORAL_TABLET | Freq: Four times a day (QID) | ORAL | Status: DC | PRN
  Administered 2020-12-27: 650 mg via ORAL
  Filled 2020-12-26: qty 2

## 2020-12-26 MED ORDER — POLYETHYLENE GLYCOL 3350 17 G PO PACK: 17.0000 g | PACK | Freq: Every day | ORAL | Status: DC | PRN

## 2020-12-26 MED ORDER — OXYCODONE-ACETAMINOPHEN 5-325 MG PO TABS
1.0000 | ORAL_TABLET | Freq: Once | ORAL | Status: AC
  Administered 2020-12-26: 1 via ORAL
  Filled 2020-12-26: qty 1

## 2020-12-26 MED ORDER — INSULIN ASPART 100 UNIT/ML IJ SOLN: 0.0000 [IU] | Freq: Three times a day (TID) | INTRAMUSCULAR | Status: DC

## 2020-12-26 MED ORDER — ACETAMINOPHEN 650 MG RE SUPP: 650.0000 mg | Freq: Four times a day (QID) | RECTAL | Status: DC | PRN

## 2020-12-26 MED ORDER — OXYCODONE-ACETAMINOPHEN 5-325 MG PO TABS
1.0000 | ORAL_TABLET | Freq: Four times a day (QID) | ORAL | Status: DC | PRN
Start: 2020-12-26 — End: 2020-12-28
  Administered 2020-12-27 – 2020-12-28 (×3): 1 via ORAL
  Filled 2020-12-26 (×3): qty 1

## 2020-12-26 MED ORDER — ALLOPURINOL 100 MG PO TABS
100.0000 mg | ORAL_TABLET | Freq: Every day | ORAL | Status: DC
  Administered 2020-12-26 – 2020-12-28 (×3): 100 mg via ORAL
  Filled 2020-12-26 (×3): qty 1

## 2020-12-26 MED ORDER — QUETIAPINE FUMARATE 25 MG PO TABS
25.0000 mg | ORAL_TABLET | Freq: Every day | ORAL | Status: DC
  Administered 2020-12-26 – 2020-12-27 (×2): 25 mg via ORAL
  Filled 2020-12-26 (×2): qty 1

## 2020-12-26 MED ORDER — TORSEMIDE 20 MG PO TABS
20.0000 mg | ORAL_TABLET | Freq: Every day | ORAL | Status: DC
  Administered 2020-12-27: 20 mg via ORAL
  Filled 2020-12-26: qty 1

## 2020-12-26 MED ORDER — MORPHINE SULFATE (PF) 2 MG/ML IV SOLN
2.0000 mg | Freq: Once | INTRAVENOUS | Status: AC
Start: 2020-12-26 — End: 2020-12-26
  Administered 2020-12-26: 2 mg via INTRAVENOUS
  Filled 2020-12-26: qty 1

## 2020-12-26 MED ORDER — LACTATED RINGERS IV BOLUS
1000.0000 mL | Freq: Once | INTRAVENOUS | Status: AC
  Administered 2020-12-26: 1000 mL via INTRAVENOUS

## 2020-12-26 MED ORDER — SPIRONOLACTONE 25 MG PO TABS
25.0000 mg | ORAL_TABLET | Freq: Every day | ORAL | Status: DC
  Administered 2020-12-26 – 2020-12-27 (×2): 25 mg via ORAL
  Filled 2020-12-26 (×2): qty 1

## 2020-12-26 MED ORDER — GABAPENTIN 300 MG PO CAPS
300.0000 mg | ORAL_CAPSULE | Freq: Two times a day (BID) | ORAL | Status: DC
  Administered 2020-12-26 – 2020-12-28 (×4): 300 mg via ORAL
  Filled 2020-12-26 (×4): qty 1

## 2020-12-26 MED ORDER — POTASSIUM CHLORIDE 20 MEQ PO PACK
40.0000 meq | PACK | ORAL | Status: AC
  Administered 2020-12-26 – 2020-12-27 (×2): 40 meq via ORAL
  Filled 2020-12-26 (×2): qty 2

## 2020-12-26 MED ORDER — ONDANSETRON HCL 4 MG/2ML IJ SOLN: 4.0000 mg | Freq: Four times a day (QID) | INTRAMUSCULAR | Status: DC | PRN

## 2020-12-26 MED ORDER — INSULIN ASPART 100 UNIT/ML IJ SOLN: 0.0000 [IU] | Freq: Every day | INTRAMUSCULAR | Status: DC

## 2020-12-26 MED ORDER — ONDANSETRON HCL 4 MG PO TABS
4.0000 mg | ORAL_TABLET | Freq: Four times a day (QID) | ORAL | Status: DC | PRN
Start: 2020-12-26 — End: 2020-12-29

## 2020-12-26 MED ORDER — MEMANTINE HCL 10 MG PO TABS
10.0000 mg | ORAL_TABLET | Freq: Two times a day (BID) | ORAL | Status: DC
  Administered 2020-12-26 – 2020-12-28 (×4): 10 mg via ORAL
  Filled 2020-12-26 (×4): qty 1

## 2020-12-26 NOTE — H&P (Signed)
History and Physical    [name]:[id number] DOB: 01-May-1933 DOA: 12/26/2020  PCP: Celene Squibb, MD   Patient coming from: Home  I have personally briefly reviewed patient's old medical records in [place]  Chief Complaint: Fall  HPI: Jasmine Bridges is a 87 y.o. female with medical history significant for diabetes mellitus, hypertension.  Patient was brought to the ED via EMS reports of a fall today- patient rolled off her bed and hit her head on the nightstand, she sustained bruising to her face and a laceration head from this fall. On my evaluation, patient is able to answer a few questions, but she has some mild baseline dementia but her mental status is at baseline.  Patient's daughter Jasmine Bridges is at bedside.  He tells me patient was found at about 9 AM this morning, usually patient gets up at 11 AM in the morning.  Unknown how long patient was on the floor or at what time patient fell last night.  Patient reports she rolled off her bed, and this has happened before. Daughter reports patient is prone to falls, fallen several times in the past, but she has not fallen in the past 4 months.  Patient lives with her daughter Jasmine Bridges.  Only other family is in mother's daughter, but she works all day and cannot care for patient. At baseline patient ambulates with a cane or walker.  Daughter was told by provider, that patient has mild dementia, but daughter reports patient occasionally has hallucinates, and sometimes confused. Patient is currently complaining of pain involving her neck and shoulder since the fall.  ED Course: Heart rate 70s to 80s.  Potassium 3.1. Mag- 1.9, CK- 834.,  Cervical CT shows fracture to the right anterior C5-C6 bridging osteophytes with 3 mm of distraction.,  Also high density fluid and stranding in the soft tissues of the left neck concerning for a hemorrhagic contusion.   Pelvic and chest x-ray, maxillofacial and head CT without acute abnormality. EDP talked to  neurosurgeon on-call Dr. Marcello Moores, who recommended c-collar to be in place for 6 to 12 weeks and patient may follow-up in office in 3 to 4 weeks.  Also regarding hemorrhagic contusion of the left neck, as patient does not have hematoma or compression of underlying structures on exam this is less concerning. Initial plan was to discharge patient from the ED, but with worsening neck pain, and neck collar, patient requiring more care, and her daughter who is her caregiver is unable to provide this. She is also worried patient will continue to fall out of bed.     Review of Systems: As per HPI all other systems reviewed and negative.  Past Medical History:  Diagnosis Date  . Arthritis   . Diabetes mellitus type II   . GERD (gastroesophageal reflux disease)   . Hyperlipidemia   . Hypertension   . Mitral insufficiency     Past Surgical History:  Procedure Laterality Date  . ABDOMINAL HYSTERECTOMY     partial   . BACK SURGERY  1979, 1987, 2000  . FRACTURE SURGERY     hip (bilateral)  . JOINT REPLACEMENT    . PARTIAL HYSTERECTOMY    . SPINE SURGERY    . TOTAL HIP ARTHROPLASTY       reports that she has quit smoking. Her smoking use included cigarettes. She smoked 1.50 packs per day. She has never used smokeless tobacco. She reports that she does not drink alcohol and does not  use drugs.  Allergies  Allergen Reactions  . Adhesive [Tape]   . Latex     Family History  Problem Relation Age of Onset  . Heart attack Father 74  . Heart disease Father        before age 72  . Mitral valve prolapse Daughter   . Stroke Mother   . ALS Brother   . Heart attack Son   . Heart disease Son        before age 73  . Coronary artery disease Other     Prior to Admission medications   Medication Sig Start Date End Date Taking? Authorizing Provider  allopurinol (ZYLOPRIM) 100 MG tablet Take 100 mg by mouth daily. Reported on 09/04/2015   Yes [provider]  Cholecalciferol (D3-1000) 25  MCG (1000 UT) tablet Take 2,000 Units by mouth daily.   Yes [provider]  gabapentin (NEURONTIN) 300 MG capsule Take 300 mg by mouth 2 (two) times daily.   Yes [provider]  memantine (NAMENDA) 10 MG tablet Take 10 mg by mouth 2 (two) times daily. 11/27/20  Yes [provider]  potassium chloride (KLOR-CON) 10 MEQ tablet Take 10 mEq by mouth daily.   Yes [provider]  QUEtiapine (SEROQUEL) 25 MG tablet Take 25 mg by mouth at bedtime.   Yes [provider]  spironolactone (ALDACTONE) 25 MG tablet Take 25 mg by mouth daily.   Yes [provider]  torsemide (DEMADEX) 20 MG tablet Take 20 mg by mouth daily.   Yes [provider]  ALPRAZolam (XANAX) 0.25 MG tablet Take 0.25 mg by mouth at bedtime as needed for anxiety. Reported on 09/04/2015 Patient not taking: No sig reported    [provider]  nystatin cream (MYCOSTATIN) APPLY TO AFFECTD AREA TWICE DAILY. Patient not taking: No sig reported 01/19/15   Jonnie Kind, MD    Physical Exam: Vitals:   12/26/20 1430 12/26/20 1500 12/26/20 1530 12/26/20 1600  BP: (!) 153/74 (!) 167/58 (!) 172/80 (!) 155/109  Pulse:  88 83   Resp: 17 20 (!) 22 20  Temp:      TempSrc:      SpO2:  99% 98%   Weight:      Height:        Constitutional:  calm, comfortable Vitals:   12/26/20 1430 12/26/20 1500 12/26/20 1530 12/26/20 1600  BP: (!) 153/74 (!) 167/58 (!) 172/80 (!) 155/109  Pulse:  88 83   Resp: 17 20 (!) 22 20  Temp:      TempSrc:      SpO2:  99% 98%   Weight:      Height:       Eyes: PERRL, lids and conjunctivae normal ENMT: Mucous membranes are moist. Neck: Rigid Cervical collar in place, normal, Respiratory: clear to auscultation bilaterally, no wheezing, no crackles. Normal respiratory effort. No accessory muscle use.  Cardiovascular: Irregular rate and rhythm, no murmurs / rubs / gallops. No extremity edema. 2+ pedal pulses.  Abdomen: no tenderness, no masses  palpated. No hepatosplenomegaly. Bowel sounds positive.  Musculoskeletal: no clubbing / cyanosis. No joint deformity upper and lower extremities. Good ROM, no contractures. Normal muscle tone.  Skin: ~ 6cm wound to left front-temporal area, bruising to left side of face, no rashes, lesions, ulcers. No induration Neurologic: No facial asymmetry, 4+5 strength in bilateral upper extremity, able to move lower extremity Psychiatric: Oriented to person, place and time, but not situation.  Able to  tell me her name, knows she is in the hospital but does not know why she is here..   Labs on Admission: I have personally reviewed following labs and imaging studies  CBC: Recent Labs  Lab 12/26/20 1314  WBC 9.4  NEUTROABS 7.5  HGB 12.5  HCT 36.9  MCV 90.7  PLT 774   Basic Metabolic Panel: Recent Labs  Lab 12/26/20 1314  NA 137  K 3.1*  CL 105  CO2 23  GLUCOSE 156*  BUN 19  CREATININE 1.17*  CALCIUM 9.0  MG 1.9   Liver Function Tests: Recent Labs  Lab 12/26/20 1314  AST 28  ALT 12  ALKPHOS 57  BILITOT 0.7  PROT 6.9  ALBUMIN 3.6   Coagulation Profile: Recent Labs  Lab 12/26/20 1314  INR 1.0   Cardiac Enzymes: Recent Labs  Lab 12/26/20 1314  CKTOTAL 834*   Radiological Exams on Admission: DG Ribs Unilateral W/Chest Left  Result Date: 12/26/2020 CLINICAL DATA:  Status post fall. EXAM: LEFT RIBS AND CHEST - 3+ VIEW COMPARISON:  None. FINDINGS: No fracture or other bone lesions are seen involving the ribs. There is no evidence of pneumothorax or pleural effusion. Both lungs are clear. Heart size and mediastinal contours are within normal limits. IMPRESSION: Negative. Electronically Signed   By: Kathreen Devoid   On: 12/26/2020 13:18   CT HEAD WO CONTRAST  Result Date: 12/26/2020 CLINICAL DATA:  Status post fall.  Hit head on nightstand. EXAM: CT HEAD WITHOUT CONTRAST CT CERVICAL SPINE WITHOUT CONTRAST TECHNIQUE: Multidetector CT imaging of the head and cervical spine was  performed following the standard protocol without intravenous contrast. Multiplanar CT image reconstructions of the cervical spine were also generated. COMPARISON:  None. FINDINGS: Brain: No evidence of acute infarction, hemorrhage, extra-axial collection, or mass effect. Mild ventriculomegaly commensurate with the degree of cerebral atrophy. Generalized cerebral atrophy. Periventricular white matter low attenuation likely secondary to microangiopathy. Vascular: Cerebrovascular atherosclerotic calcifications are noted. No hyperdense vessels. Skull: Negative for fracture or focal lesion. Sinuses/Orbits: Visualized portions of the orbits are unremarkable. Visualized portions of the paranasal sinuses are unremarkable. Visualized portions of the mastoid air cells are unremarkable. Other: None. CT CERVICAL SPINE FINDINGS Alignment: Normal. Skull base and vertebrae: Fracture through a right anterior C5-6 bridging osteophyte with 3 mm of distraction. No primary bone lesion or focal pathologic process. Soft tissues and spinal canal: No prevertebral fluid or swelling. No visible canal hematoma. Disc levels: Degenerative disease with disc height loss at C4-5, C5-6 and C6-7 and C7-T1. Acquired ankylosis across the C4-5 disc space. Ankylosis of the posterior elements at C3-4 C4-5. At C3-4 there is a broad-based disc osteophyte complex. Low left uncovertebral degenerative changes. Moderate right and severe left facet arthropathy. Severe left foraminal stenosis. Mild right foraminal stenosis. At C4-5 there is interbody fusion. Moderate right and mild left facet arthropathy. Moderate right foraminal stenosis. No left foraminal stenosis. At C5-6 there is a broad-based disc osteophyte complex. Uncovertebral degenerative changes bilaterally. Severe right foraminal stenosis. Mild left foraminal stenosis. At C6-7 there is a broad-based disc osteophyte complex. Bilateral uncovertebral degenerative changes. Moderate bilateral foraminal  stenosis. At C7-T1 there is a broad-based disc bulge. Bilateral uncovertebral degenerative changes. Severe bilateral foraminal stenosis. Upper chest: Lung apices are clear. Other: High-density fluid and stranding in the soft tissues of the left neck concerning for a hemorrhagic contusion. IMPRESSION: 1. No acute intracranial pathology. 2. Fracture through a right anterior C5-6 bridging osteophyte with 3 mm of distraction. 3. High-density  fluid and stranding in the soft tissues of the left neck concerning for a hemorrhagic contusion. 4. Cervical spine spondylosis as described above. Critical Value/emergent results were called by telephone at the time of interpretation on 12/26/2020 at 1:58 pm to provider [facility] , who verbally acknowledged these results. Electronically Signed   By: Kathreen Devoid   On: 12/26/2020 13:59   CT CERVICAL SPINE WO CONTRAST  Result Date: 12/26/2020 CLINICAL DATA:  Status post fall.  Hit head on nightstand. EXAM: CT HEAD WITHOUT CONTRAST CT CERVICAL SPINE WITHOUT CONTRAST TECHNIQUE: Multidetector CT imaging of the head and cervical spine was performed following the standard protocol without intravenous contrast. Multiplanar CT image reconstructions of the cervical spine were also generated. COMPARISON:  None. FINDINGS: Brain: No evidence of acute infarction, hemorrhage, extra-axial collection, or mass effect. Mild ventriculomegaly commensurate with the degree of cerebral atrophy. Generalized cerebral atrophy. Periventricular white matter low attenuation likely secondary to microangiopathy. Vascular: Cerebrovascular atherosclerotic calcifications are noted. No hyperdense vessels. Skull: Negative for fracture or focal lesion. Sinuses/Orbits: Visualized portions of the orbits are unremarkable. Visualized portions of the paranasal sinuses are unremarkable. Visualized portions of the mastoid air cells are unremarkable. Other: None. CT CERVICAL SPINE FINDINGS Alignment: Normal. Skull  base and vertebrae: Fracture through a right anterior C5-6 bridging osteophyte with 3 mm of distraction. No primary bone lesion or focal pathologic process. Soft tissues and spinal canal: No prevertebral fluid or swelling. No visible canal hematoma. Disc levels: Degenerative disease with disc height loss at C4-5, C5-6 and C6-7 and C7-T1. Acquired ankylosis across the C4-5 disc space. Ankylosis of the posterior elements at C3-4 C4-5. At C3-4 there is a broad-based disc osteophyte complex. Low left uncovertebral degenerative changes. Moderate right and severe left facet arthropathy. Severe left foraminal stenosis. Mild right foraminal stenosis. At C4-5 there is interbody fusion. Moderate right and mild left facet arthropathy. Moderate right foraminal stenosis. No left foraminal stenosis. At C5-6 there is a broad-based disc osteophyte complex. Uncovertebral degenerative changes bilaterally. Severe right foraminal stenosis. Mild left foraminal stenosis. At C6-7 there is a broad-based disc osteophyte complex. Bilateral uncovertebral degenerative changes. Moderate bilateral foraminal stenosis. At C7-T1 there is a broad-based disc bulge. Bilateral uncovertebral degenerative changes. Severe bilateral foraminal stenosis. Upper chest: Lung apices are clear. Other: High-density fluid and stranding in the soft tissues of the left neck concerning for a hemorrhagic contusion. IMPRESSION: 1. No acute intracranial pathology. 2. Fracture through a right anterior C5-6 bridging osteophyte with 3 mm of distraction. 3. High-density fluid and stranding in the soft tissues of the left neck concerning for a hemorrhagic contusion. 4. Cervical spine spondylosis as described above. Critical Value/emergent results were called by telephone at the time of interpretation on 12/26/2020 at 1:58 pm to provider [facility] , who verbally acknowledged these results. Electronically Signed   By: Kathreen Devoid   On: 12/26/2020 13:59   DG Shoulder  Left  Result Date: 12/26/2020 CLINICAL DATA:  Left shoulder pain after fall today. EXAM: LEFT SHOULDER - 2+ VIEW COMPARISON:  None. FINDINGS: There is no evidence of fracture or dislocation. There is no evidence of arthropathy or other focal bone abnormality. Soft tissues are unremarkable. IMPRESSION: Negative. Electronically Signed   By: Marijo Conception M.D.   On: 12/26/2020 15:10   DG Humerus Left  Result Date: 12/26/2020 CLINICAL DATA:  Left arm pain after fall today. EXAM: LEFT HUMERUS - 2+ VIEW COMPARISON:  None. FINDINGS: There is no evidence of fracture or other focal  bone lesions. Soft tissues are unremarkable. IMPRESSION: Negative. Electronically Signed   By: Marijo Conception M.D.   On: 12/26/2020 15:11   DG Hip Unilat W or Wo Pelvis 2-3 Views Right  Result Date: 12/26/2020 CLINICAL DATA:  Fall. EXAM: DG HIP (WITH OR WITHOUT PELVIS) 2-3V RIGHT COMPARISON:  06/13/2003. FINDINGS: Bilateral hip replacements. Hardware intact. Anatomic alignment. Prior lumbar spine fusion. Diffuse osteopenia. Degenerative changes lumbar spine and both hips. No acute bony abnormality identified. Specifically the right hip is intact. IMPRESSION: 1. Bilateral hip replacements. Prior lumbar spine fusion. Hardware intact. 2. Diffuse osteopenia. Degenerative changes lumbar spine and both SI joints. 3. No acute bony abnormality. Specifically the right hip is intact. Electronically Signed   By: Marcello Moores  Register   On: 12/26/2020 13:18   CT Maxillofacial WO CM  Result Date: 12/26/2020 CLINICAL DATA:  Head trauma, fell off bed striking head on nightstand, laceration LEFT frontal, denies loss of consciousness, has cervical spine fracture EXAM: CT MAXILLOFACIAL WITHOUT CONTRAST TECHNIQUE: Multidetector CT imaging of the maxillofacial structures was performed. Multiplanar CT image reconstructions were also generated. Right side of face marked with BB. COMPARISON:  None FINDINGS: Osseous: Osseous demineralization. Scattered beam  hardening artifacts of dental origin. Facial bones intact. No facial bone fractures identified. Orbits: Bony orbits intact. Intraorbital soft tissue planes clear. No orbital pneumatosis or fluid. Sinuses: Mild nasal septal deviation to the RIGHT. Paranasal sinuses clear. Mastoid air cells and middle ear cavities clear. Soft tissues: LEFT facial soft tissue swelling/contusion and skin thickening. Mild atherosclerotic calcifications at carotid bifurcations Limited intracranial: Unremarkable IMPRESSION: No acute facial bone abnormalities. Electronically Signed   By: Lavonia Dana M.D.   On: 12/26/2020 15:00    EKG: Independently reviewed.  Read by EKG device as atrial flutter.  P waves intermittently present, but rhythm is irregular.  Rate 83, QTc 435.  Assessment/Plan Principal Problem:   Fall Active Problems:   Cervical Osteophyte Fracture   DM (diabetes mellitus) ([place])   HTN (hypertension)  Mechanical fall with subsequent fracture of bridging cervical osteophyte- Fracture through a right anterior C5-6 bridging osteophyte with 3 mm of distraction. High-density fluid and stranding in the soft tissues of the left neck concerning for a hemorrhagic contusion. - EDP talked to neurosurgeon on-call Dr. Marcello Moores, recommended c-collar to be in place for 6 to 12 weeks and patient may follow-up in office in 3 to 4 weeks.  Also regarding hemorrhagic contusion of the left neck, as patient does not have hematoma or compression of underlying structures on exam this is less concerning. -Unsafe discharge due to caregiver limitations-daughter with knee and back problems, unable to lift patient, daughter open to the option of placement. -Will need hospital bed if discharged home. -Physical therapy evaluation, social work consult -1 L Bolus given -Oxycodone acetaminophen 5/325 as needed  Hypokalemia potassium 3.1.  Magnesium 1.9. 2/2 Torsemide -Replete potassium  Diabetes mellitus-random glucose 156.  Diet  controlled. - SSI- S  Hypertension-systolic 720N to 470J. -Resume torsemide, spironolactone  DVT prophylaxis: SCDs for now with hemorrhagic contusion Code Status: DNR, confirmed with daughter at bedside. Family Communication: Daughter [name] at [employer] Disposition Plan: ~ 1 day Consults called: None Admission status: Obs tele   Bethena Roys MD [facility]  12/26/2020, 7:22 PM

## 2020-12-26 NOTE — ED Triage Notes (Signed)
Pt arrived by [facility]. Pt fell at her home today. Per EMS, pt rolled off her bed and hit her head on her night stand. Pt denies taking thinners.

## 2020-12-26 NOTE — ED Provider Notes (Signed)
[facility] Provider Note   CSN: [id number] Arrival date & time: 12/26/20  1123     History Chief Complaint  Patient presents with  . Fall    Jasmine Bridges is a 87 y.o. female who presents via EMS after being found down on the floor next to her bed this morning around 930.  Last known well was 2 AM, when her daughter who is her caretaker checked in on her in the middle of the night.  She states that she got up to shower at 930 this morning and found the patient lying on the ground, moaning next to her bed.  Unknown downtime.  Patient in c-collar to initial exam, unable to recall the incident, states she thinks that she rolled out of her bed.  Denies any pain at this time, moving all 4 extremities spontaneously without difficulty, but does endorse discomfort with the collar.  I personally reviewed this patient's medical records.  She has history of diabetes, hypertension, mitral insufficiency.  She is not on any anticoagulation.   HPI     Past Medical History:  Diagnosis Date  . Arthritis   . Diabetes mellitus type II   . GERD (gastroesophageal reflux disease)   . Hyperlipidemia   . Hypertension   . Mitral insufficiency     Patient Active Problem List   Diagnosis Date Noted  . Fall 12/26/2020  . HTN (hypertension) 12/26/2020  . Cervical Osteophyte Fracture 12/26/2020  . Routine gynecological examination 01/04/2014  . Unspecified disorder of kidney and ureter 12/27/2013  . DM (diabetes mellitus) ([place]) 12/27/2013  . Pain in limb 12/27/2013  . Unspecified vitamin D deficiency 12/27/2013  . Edema 12/27/2013  . Other and unspecified hyperlipidemia 12/27/2013  . Cramp of limb 12/27/2013  . Esophageal reflux 12/27/2013  . Rash and other nonspecific skin eruption 12/27/2013  . Backache, unspecified 12/27/2013  . Anxiety state, unspecified 12/27/2013  . Shortness of breath 12/27/2013  . Osteoporosis, unspecified 12/27/2013  . 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 Chronic kidney disease, unspecified 12/27/2013  . Other bursitis disorders 12/27/2013  . DYSPNEA 08/16/2008    Past Surgical History:  Procedure Laterality Date  . ABDOMINAL HYSTERECTOMY     partial   . BACK SURGERY  1979, 1987, 2000  . FRACTURE SURGERY     hip (bilateral)  . JOINT REPLACEMENT    . PARTIAL HYSTERECTOMY    . SPINE SURGERY    . TOTAL HIP ARTHROPLASTY       OB History   No obstetric history on file.     Family History  Problem Relation Age of Onset  . Heart attack Father 41  . Heart disease Father        before age 67  . Mitral valve prolapse Daughter   . Stroke Mother   . ALS Brother   . Heart attack Son   . Heart disease Son        before age 70  . Coronary artery disease Other     Social History   Tobacco Use  . Smoking status: Former Smoker    Packs/day: 1.50    Types: Cigarettes  . Smokeless tobacco: Never Used  Vaping Use  . Vaping Use: Never used  Substance Use Topics  . Alcohol use: No  . Drug use: No    Home Medications Prior to Admission medications   Medication Sig Start Date End Date Taking? Authorizing Provider  allopurinol (ZYLOPRIM) 100 MG tablet Take 100  mg by mouth daily. Reported on 09/04/2015   Yes [provider]  Cholecalciferol (D3-1000) 25 MCG (1000 UT) tablet Take 2,000 Units by mouth daily.   Yes [provider]  gabapentin (NEURONTIN) 300 MG capsule Take 300 mg by mouth 2 (two) times daily.   Yes [provider]  memantine (NAMENDA) 10 MG tablet Take 10 mg by mouth 2 (two) times daily. 11/27/20  Yes [provider]  potassium chloride (KLOR-CON) 10 MEQ tablet Take 10 mEq by mouth daily.   Yes [provider]  QUEtiapine (SEROQUEL) 25 MG tablet Take 25 mg by mouth at bedtime.   Yes [provider]  spironolactone (ALDACTONE) 25 MG tablet Take 25 mg by mouth daily.   Yes [provider]  torsemide (DEMADEX) 20 MG tablet Take 20 mg by mouth daily.    Yes [provider]  ALPRAZolam (XANAX) 0.25 MG tablet Take 0.25 mg by mouth at bedtime as needed for anxiety. Reported on 09/04/2015 Patient not taking: No sig reported    [provider]  nystatin cream (MYCOSTATIN) APPLY TO AFFECTD AREA TWICE DAILY. Patient not taking: No sig reported 01/19/15   Jonnie Kind, MD    Allergies    Adhesive [tape] and Latex  Review of Systems   Review of Systems  Constitutional: Negative.   HENT: Negative.   Respiratory: Negative.   Cardiovascular: Negative.   Gastrointestinal: Negative.   Musculoskeletal: Positive for arthralgias, myalgias and neck pain.  Neurological: Positive for headaches. Negative for dizziness and light-headedness.       LOC  Psychiatric/Behavioral: Positive for confusion.    Physical Exam Updated Vital Signs BP (!) 173/86   Pulse 82   Temp 98 F (36.7 C) (Oral)   Resp 18   Ht 5\' 4"  (1.626 m)   Wt 70.8 kg   SpO2 100%   BMI 26.78 kg/m   Physical Exam Vitals and nursing note reviewed.  Constitutional:      Appearance: She is not ill-appearing or toxic-appearing.     Interventions: Cervical collar in place.  HENT:     Head: Normocephalic.      Right Ear: Tympanic membrane normal. No hemotympanum.     Left Ear: Tympanic membrane normal. No hemotympanum.     Nose: Nose normal.     Right Nostril: No septal hematoma.     Left Nostril: No septal hematoma.     Mouth/Throat:     Mouth: Mucous membranes are moist.     Pharynx: Oropharynx is clear. Uvula midline. No oropharyngeal exudate, posterior oropharyngeal erythema or uvula swelling.     Tonsils: No tonsillar exudate.  Eyes:     General: Lids are normal. Vision grossly intact. No scleral icterus.       Right eye: No discharge.        Left eye: No discharge.     Extraocular Movements: Extraocular movements intact.     Conjunctiva/sclera: Conjunctivae normal.     Pupils: Pupils are equal, round, and reactive to light.  Neck:     Trachea:  Trachea and phonation normal.     Comments: Cervical collar Cardiovascular:     Rate and Rhythm: Normal rate. Rhythm irregular.     Pulses: Normal pulses.     Heart sounds: Normal heart sounds. No murmur heard.   Pulmonary:     Effort: Pulmonary effort is normal. No tachypnea, bradypnea, accessory muscle usage or respiratory distress.     Breath sounds: Normal breath sounds. No  wheezing or rales.  Chest:     Chest wall: No mass, lacerations, deformity, swelling, tenderness, crepitus or edema.  Abdominal:     General: Bowel sounds are normal. There is no distension.     Palpations: Abdomen is soft.     Tenderness: There is no abdominal tenderness. There is no right CVA tenderness, left CVA tenderness, guarding or rebound.  Musculoskeletal:        General: No deformity.     Right shoulder: Normal.     Left shoulder: Normal.     Right upper arm: Normal.     Left upper arm: Tenderness present. No swelling, edema or bony tenderness.     Right elbow: Normal.     Left elbow: Normal.     Right forearm: Normal.     Left forearm: Normal.     Right wrist: Normal.     Left wrist: Normal.     Right hand: Normal.     Left hand: Normal.       Arms:     Cervical back: Neck supple. Signs of trauma, spasms and tenderness present. No edema, deformity, bony tenderness or crepitus. Muscular tenderness present. No spinous process tenderness.     Thoracic back: Normal.     Lumbar back: Normal.     Right lower leg: No edema.     Left lower leg: No edema.     Comments: Symmetric strength and sensation in upper and lower extremities bilaterally  Lymphadenopathy:     Cervical: No cervical adenopathy.  Skin:    General: Skin is warm and dry.     Capillary Refill: Capillary refill takes less than 2 seconds.  Neurological:     Mental Status: She is alert. Mental status is at baseline.     Cranial Nerves: Cranial nerves are intact.     Sensory: Sensation is intact.     Motor: Motor function is  intact.     Comments: Patient's daughter at the bedside, patient is pleasantly mildly confused at baseline, per family at cognitive baseline.  Able to follow commands and answer questions appropriately, not somnolent.  Psychiatric:        Mood and Affect: Mood normal.     ED Results / Procedures / Treatments   Labs (all labs ordered are listed, but only abnormal results are displayed) Labs Reviewed  COMPREHENSIVE METABOLIC PANEL - Abnormal; Notable for the following components:      Result Value   Potassium 3.1 (*)    Glucose, Bld 156 (*)    Creatinine, Ser 1.17 (*)    GFR, Estimated 46 (*)    All other components within normal limits  CK - Abnormal; Notable for the following components:   Total CK 834 (*)    All other components within normal limits  RESP PANEL BY RT-PCR (FLU A&B, COVID) ARPGX2  URINE CULTURE  ETHANOL  PROTIME-INR  CBC WITH DIFFERENTIAL/PLATELET  MAGNESIUM  RAPID URINE DRUG SCREEN, HOSP PERFORMED  URINALYSIS, ROUTINE W REFLEX MICROSCOPIC  CK    EKG EKG Interpretation  Date/Time:  Wednesday December 26 2020 11:37:26 EDT Ventricular Rate:  83 PR Interval:    QRS Duration: 98 QT Interval:  370 QTC Calculation: 435 R Axis:   -24 Text Interpretation: Atrial flutter Ventricular premature complex Borderline left axis deviation Abnormal R-wave progression, early transition Borderline T wave abnormalities Confirmed by Thamas Jaegers ([id number]) on 12/26/2020 2:28:10 PM   Radiology DG Ribs Unilateral W/Chest Left  Result Date: 12/26/2020 CLINICAL DATA:  Status post fall. EXAM: LEFT RIBS AND CHEST - 3+ VIEW COMPARISON:  None. FINDINGS: No fracture or other bone lesions are seen involving the ribs. There is no evidence of pneumothorax or pleural effusion. Both lungs are clear. Heart size and mediastinal contours are within normal limits. IMPRESSION: Negative. Electronically Signed   By: Kathreen Devoid   On: 12/26/2020 13:18   CT HEAD WO CONTRAST  Result Date:  12/26/2020 CLINICAL DATA:  Status post fall.  Hit head on nightstand. EXAM: CT HEAD WITHOUT CONTRAST CT CERVICAL SPINE WITHOUT CONTRAST TECHNIQUE: Multidetector CT imaging of the head and cervical spine was performed following the standard protocol without intravenous contrast. Multiplanar CT image reconstructions of the cervical spine were also generated. COMPARISON:  None. FINDINGS: Brain: No evidence of acute infarction, hemorrhage, extra-axial collection, or mass effect. Mild ventriculomegaly commensurate with the degree of cerebral atrophy. Generalized cerebral atrophy. Periventricular white matter low attenuation likely secondary to microangiopathy. Vascular: Cerebrovascular atherosclerotic calcifications are noted. No hyperdense vessels. Skull: Negative for fracture or focal lesion. Sinuses/Orbits: Visualized portions of the orbits are unremarkable. Visualized portions of the paranasal sinuses are unremarkable. Visualized portions of the mastoid air cells are unremarkable. Other: None. CT CERVICAL SPINE FINDINGS Alignment: Normal. Skull base and vertebrae: Fracture through a right anterior C5-6 bridging osteophyte with 3 mm of distraction. No primary bone lesion or focal pathologic process. Soft tissues and spinal canal: No prevertebral fluid or swelling. No visible canal hematoma. Disc levels: Degenerative disease with disc height loss at C4-5, C5-6 and C6-7 and C7-T1. Acquired ankylosis across the C4-5 disc space. Ankylosis of the posterior elements at C3-4 C4-5. At C3-4 there is a broad-based disc osteophyte complex. Low left uncovertebral degenerative changes. Moderate right and severe left facet arthropathy. Severe left foraminal stenosis. Mild right foraminal stenosis. At C4-5 there is interbody fusion. Moderate right and mild left facet arthropathy. Moderate right foraminal stenosis. No left foraminal stenosis. At C5-6 there is a broad-based disc osteophyte complex. Uncovertebral degenerative changes  bilaterally. Severe right foraminal stenosis. Mild left foraminal stenosis. At C6-7 there is a broad-based disc osteophyte complex. Bilateral uncovertebral degenerative changes. Moderate bilateral foraminal stenosis. At C7-T1 there is a broad-based disc bulge. Bilateral uncovertebral degenerative changes. Severe bilateral foraminal stenosis. Upper chest: Lung apices are clear. Other: High-density fluid and stranding in the soft tissues of the left neck concerning for a hemorrhagic contusion. IMPRESSION: 1. No acute intracranial pathology. 2. Fracture through a right anterior C5-6 bridging osteophyte with 3 mm of distraction. 3. High-density fluid and stranding in the soft tissues of the left neck concerning for a hemorrhagic contusion. 4. Cervical spine spondylosis as described above. Critical Value/emergent results were called by telephone at the time of interpretation on 12/26/2020 at 1:58 pm to provider [facility] , who verbally acknowledged these results. Electronically Signed   By: Kathreen Devoid   On: 12/26/2020 13:59   CT CERVICAL SPINE WO CONTRAST  Result Date: 12/26/2020 CLINICAL DATA:  Status post fall.  Hit head on nightstand. EXAM: CT HEAD WITHOUT CONTRAST CT CERVICAL SPINE WITHOUT CONTRAST TECHNIQUE: Multidetector CT imaging of the head and cervical spine was performed following the standard protocol without intravenous contrast. Multiplanar CT image reconstructions of the cervical spine were also generated. COMPARISON:  None. FINDINGS: Brain: No evidence of acute infarction, hemorrhage, extra-axial collection, or mass effect. Mild ventriculomegaly commensurate with the degree of cerebral atrophy. Generalized cerebral atrophy. Periventricular white matter low attenuation likely secondary to microangiopathy. Vascular: Cerebrovascular atherosclerotic calcifications are noted. No  hyperdense vessels. Skull: Negative for fracture or focal lesion. Sinuses/Orbits: Visualized portions of the orbits are  unremarkable. Visualized portions of the paranasal sinuses are unremarkable. Visualized portions of the mastoid air cells are unremarkable. Other: None. CT CERVICAL SPINE FINDINGS Alignment: Normal. Skull base and vertebrae: Fracture through a right anterior C5-6 bridging osteophyte with 3 mm of distraction. No primary bone lesion or focal pathologic process. Soft tissues and spinal canal: No prevertebral fluid or swelling. No visible canal hematoma. Disc levels: Degenerative disease with disc height loss at C4-5, C5-6 and C6-7 and C7-T1. Acquired ankylosis across the C4-5 disc space. Ankylosis of the posterior elements at C3-4 C4-5. At C3-4 there is a broad-based disc osteophyte complex. Low left uncovertebral degenerative changes. Moderate right and severe left facet arthropathy. Severe left foraminal stenosis. Mild right foraminal stenosis. At C4-5 there is interbody fusion. Moderate right and mild left facet arthropathy. Moderate right foraminal stenosis. No left foraminal stenosis. At C5-6 there is a broad-based disc osteophyte complex. Uncovertebral degenerative changes bilaterally. Severe right foraminal stenosis. Mild left foraminal stenosis. At C6-7 there is a broad-based disc osteophyte complex. Bilateral uncovertebral degenerative changes. Moderate bilateral foraminal stenosis. At C7-T1 there is a broad-based disc bulge. Bilateral uncovertebral degenerative changes. Severe bilateral foraminal stenosis. Upper chest: Lung apices are clear. Other: High-density fluid and stranding in the soft tissues of the left neck concerning for a hemorrhagic contusion. IMPRESSION: 1. No acute intracranial pathology. 2. Fracture through a right anterior C5-6 bridging osteophyte with 3 mm of distraction. 3. High-density fluid and stranding in the soft tissues of the left neck concerning for a hemorrhagic contusion. 4. Cervical spine spondylosis as described above. Critical Value/emergent results were called by telephone at  the time of interpretation on 12/26/2020 at 1:58 pm to provider [facility] , who verbally acknowledged these results. Electronically Signed   By: Kathreen Devoid   On: 12/26/2020 13:59   DG Shoulder Left  Result Date: 12/26/2020 CLINICAL DATA:  Left shoulder pain after fall today. EXAM: LEFT SHOULDER - 2+ VIEW COMPARISON:  None. FINDINGS: There is no evidence of fracture or dislocation. There is no evidence of arthropathy or other focal bone abnormality. Soft tissues are unremarkable. IMPRESSION: Negative. Electronically Signed   By: Marijo Conception M.D.   On: 12/26/2020 15:10   DG Humerus Left  Result Date: 12/26/2020 CLINICAL DATA:  Left arm pain after fall today. EXAM: LEFT HUMERUS - 2+ VIEW COMPARISON:  None. FINDINGS: There is no evidence of fracture or other focal bone lesions. Soft tissues are unremarkable. IMPRESSION: Negative. Electronically Signed   By: Marijo Conception M.D.   On: 12/26/2020 15:11   DG Hip Unilat W or Wo Pelvis 2-3 Views Right  Result Date: 12/26/2020 CLINICAL DATA:  Fall. EXAM: DG HIP (WITH OR WITHOUT PELVIS) 2-3V RIGHT COMPARISON:  06/13/2003. FINDINGS: Bilateral hip replacements. Hardware intact. Anatomic alignment. Prior lumbar spine fusion. Diffuse osteopenia. Degenerative changes lumbar spine and both hips. No acute bony abnormality identified. Specifically the right hip is intact. IMPRESSION: 1. Bilateral hip replacements. Prior lumbar spine fusion. Hardware intact. 2. Diffuse osteopenia. Degenerative changes lumbar spine and both SI joints. 3. No acute bony abnormality. Specifically the right hip is intact. Electronically Signed   By: Marcello Moores  Register   On: 12/26/2020 13:18   CT Maxillofacial WO CM  Result Date: 12/26/2020 CLINICAL DATA:  Head trauma, fell off bed striking head on nightstand, laceration LEFT frontal, denies loss of consciousness, has cervical spine fracture EXAM: CT MAXILLOFACIAL  WITHOUT CONTRAST TECHNIQUE: Multidetector CT imaging of the  maxillofacial structures was performed. Multiplanar CT image reconstructions were also generated. Right side of face marked with BB. COMPARISON:  None FINDINGS: Osseous: Osseous demineralization. Scattered beam hardening artifacts of dental origin. Facial bones intact. No facial bone fractures identified. Orbits: Bony orbits intact. Intraorbital soft tissue planes clear. No orbital pneumatosis or fluid. Sinuses: Mild nasal septal deviation to the RIGHT. Paranasal sinuses clear. Mastoid air cells and middle ear cavities clear. Soft tissues: LEFT facial soft tissue swelling/contusion and skin thickening. Mild atherosclerotic calcifications at carotid bifurcations Limited intracranial: Unremarkable IMPRESSION: No acute facial bone abnormalities. Electronically Signed   By: Lavonia Dana M.D.   On: 12/26/2020 15:00    Procedures Procedures   Medications Ordered in ED Medications  morphine 2 MG/ML injection 2 mg (has no administration in time range)  oxyCODONE-acetaminophen (PERCOCET/ROXICET) 5-325 MG per tablet 1 tablet (1 tablet Oral Given 12/26/20 1516)  lactated ringers bolus 1,000 mL (1,000 mLs Intravenous New Bag/Given 12/26/20 1521)    ED Course  I have reviewed the triage vital signs and the nursing notes.  Pertinent labs & imaging results that were available during my care of the patient were reviewed by me and considered in my medical decision making (see chart for details).  Clinical Course as of 12/26/20 1811  Wed Dec 26, 2020  1545 Consult call received from Network engineer at [facility]; appears to have been a miscommunication and office staff states that it is not possible to speak with an on-call provider as patient has not established in her care.  Have asked secretary to read page the consult to provider on-call for ED patients. [RS]  1609 Consult call received back from neurosurgeon, Dr. Marcello Moores, who recommends plan to place patient in c-collar and discharged home as patient is  neurologically intact.  He suggests patient will likely need to be in collar for 6 to 12 weeks, but may follow-up in the office in 3 to 4 weeks. He was informed regarding hemorrhagic contusion of the left neck, however as patient does not have hematoma or compression of underlying structures on exam, this is less concerning. I appreciate his collaboration in the care of this patient.  [RS]  1742 Consult to hospitalist Dr. Denton Brick, who is agreeable to seeing this patient and admitting her to her service.  I appreciate her collaboration in the care of this patient. [RS]    Clinical Course User Index [RS] Oseph Imburgia, Gypsy Balsam, PA-C   MDM Rules/Calculators/A&P                         87 year old female presents after fall out of bed at home that was unwitnessed.  She is not anticoagulated.  Concern for intracranial injury and/or cervical spine injury given unwitnessed fall with LOC; also concern for possible rhabdo and AKI given unknown downtime.  Hypertensive on intake, vital signs otherwise normal.  Patient is afebrile.  Cardiopulmonary exam revealed irregularly irregular rhythm.  Abdominal exam is benign.  There is some bruising and tenderness patient over the left lateral chest wall as well as the left medial upper arm.  Patient is in c-collar and has bruising to the jaw and very tiny subcentimeter laceration to the left frontotemporal area with crusted dried blood around it.  Hemostatic at this time.  Cranial nerves are intact, symmetric strength and sensation in upper extremities and lower extremities bilaterally.  No acute neurologic deficit on exam.  CBC unremarkable,  CMP with mild hypokalemia at 3.1 and mildly elevated creatinine to 1.17, baseline of 0.9.  Coag studies are normal, magnesium is normal, ethanol level is normal.  CK is elevated to 834, will proceed with IV fluid bolus and reevaluate.  UA pending at this time, respiratory pathogen panel negative.  Plain films of the left shoulder,  humerus, left ribs with chest, and pelvis are all negative for acute fracture or dislocation.  CT head negative for acute cranial abnormality, CT C-spine with fracture evident anterior bridging osteophyte between C5-C6 with 3 mm of distraction, additionally there is findings concerning for hemorrhagic contusion of the left neck, without hematoma.  Analgesic medication offered, will transition patient to [place] collar from EMS C-spine collar.  Consult to neurosurgery pending at this time.  Consult to neurosurgery as above.  Will place patient in [place] collar and have her follow-up in clinic.  Patient continues to become increasingly more sore throughout her emergency department stay, at this time unable to attempt ambulation due to pain.  Discussed with patient's daughter, who is her caretaker thank you, at the bedside.  Patient's daughter is requiring a knee replacement, and is unable to assist with lifting the patient in the home, does not feel she can care for her safely in her home environment.  Given patient's increasing pain, inability to ambulate at this time, and unsafe home environment without physical support, feel patient would benefit from mission to the hospital.  This plan was discussed with the patient and her daughter at the bedside who voiced understanding for medical evaluation and treatment plan.  Each of their questions was answered to their expressed inspection.  Their medical plan for admission at this time.  Case discussed with hospitalist as above, patient is stable for admission at this time.  This chart was dictated using voice recognition software, Dragon. Despite the best efforts of this provider to proofread and correct errors, errors may still occur which can change documentation meaning.  Final Clinical Impression(s) / ED Diagnoses Final diagnoses:  None    Rx / DC Orders ED Discharge Orders    None       Aura Dials 12/26/20 1811    Luna Fuse, MD 12/28/20 [phone]

## 2020-12-27 DIAGNOSIS — I10 Essential (primary) hypertension: Secondary | ICD-10-CM

## 2020-12-27 DIAGNOSIS — M6282 Rhabdomyolysis: Secondary | ICD-10-CM | POA: Diagnosis not present

## 2020-12-27 DIAGNOSIS — W19XXXA Unspecified fall, initial encounter: Secondary | ICD-10-CM | POA: Diagnosis not present

## 2020-12-27 LAB — CBC
HCT: 35 % — ABNORMAL LOW (ref 36.0–46.0)
Hemoglobin: 11.6 g/dL — ABNORMAL LOW (ref 12.0–15.0)
MCH: 30.4 pg (ref 26.0–34.0)
MCHC: 33.1 g/dL (ref 30.0–36.0)
MCV: 91.6 fL (ref 80.0–100.0)
Platelets: 163 10*3/uL (ref 150–400)
RBC: 3.82 MIL/uL — ABNORMAL LOW (ref 3.87–5.11)
RDW: 13.6 % (ref 11.5–15.5)
WBC: 8.1 10*3/uL (ref 4.0–10.5)
nRBC: 0 % (ref 0.0–0.2)

## 2020-12-27 LAB — GLUCOSE, CAPILLARY
Glucose-Capillary: 110 mg/dL — ABNORMAL HIGH (ref 70–99)
Glucose-Capillary: 78 mg/dL (ref 70–99)

## 2020-12-27 MED ORDER — POTASSIUM CHLORIDE IN NACL 20-0.9 MEQ/L-% IV SOLN: INTRAVENOUS | Status: DC

## 2020-12-27 NOTE — Care Management Obs Status (Signed)
[facility] NOTIFICATION   Patient Details  Name: Jasmine Bridges MRN: [id number] Date of Birth: [birth year]   Medicare Observation Status Notification Given:  Yes    Tommy Medal 12/27/2020, 4:29 PM

## 2020-12-27 NOTE — NC FL2 (Signed)
[place] LEVEL OF CARE SCREENING TOOL     IDENTIFICATION  Patient Name: Jasmine Bridges Birthdate: [birth year] Sex: female Admission Date (Current Location): 12/26/2020  [facility] [place] Number:  [employer] and Address:  [facility] [address] [place]      Provider Number: [id number]  Attending Physician Name and Address:  Orson Eva, MD  Relative Name and Phone Number:  [name] (Daughter)   [phone] [employer])    Current Level of Care: Other (Comment) (observation) Recommended Level of Care: [place] Prior Approval Number:    Date Approved/Denied:   PASRR Number: [id number]  Discharge Plan: SNF    Current Diagnoses: Patient Active Problem List   Diagnosis Date Noted  . Fall 12/26/2020  . HTN (hypertension) 12/26/2020  . Cervical Osteophyte Fracture 12/26/2020  . Routine gynecological examination 01/04/2014  . Unspecified disorder of kidney and ureter 12/27/2013  . DM (diabetes mellitus) ([place]) 12/27/2013  . Pain in limb 12/27/2013  . Unspecified vitamin D deficiency 12/27/2013  . Edema 12/27/2013  . Other and unspecified hyperlipidemia 12/27/2013  . Cramp of limb 12/27/2013  . Esophageal reflux 12/27/2013  . Rash and other nonspecific skin eruption 12/27/2013  . Backache, unspecified 12/27/2013  . Anxiety state, unspecified 12/27/2013  . Shortness of breath 12/27/2013  . Osteoporosis, unspecified 12/27/2013  . 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 Chronic kidney disease, unspecified 12/27/2013  . Other bursitis disorders 12/27/2013  . DYSPNEA 08/16/2008    Orientation RESPIRATION BLADDER Height & Weight     Self  Normal Continent Weight: 137 lb 12.6 oz (62.5 kg) Height:  5\' 7"  (170.2 cm)  BEHAVIORAL SYMPTOMS/MOOD NEUROLOGICAL BOWEL NUTRITION STATUS      Continent Diet (heart healthy/carb modified)  AMBULATORY STATUS COMMUNICATION OF NEEDS Skin   Limited Assist Verbally Normal                        Personal Care Assistance Level of Assistance  Bathing,Feeding,Dressing Bathing Assistance: Limited assistance Feeding assistance: Independent Dressing Assistance: Limited assistance     Functional Limitations Info  Sight,Hearing,Speech Sight Info: Adequate Hearing Info: Adequate Speech Info: Adequate    SPECIAL CARE FACTORS FREQUENCY  PT (By licensed PT)     PT Frequency: 5x/week              Contractures Contractures Info: Not present    Additional Factors Info  Code Status,Allergies,Psychotropic Code Status Info: DNR Allergies Info: Adhesive, Latex Psychotropic Info: Seroquel         Current Medications (12/27/2020):  This is the current hospital active medication list Current Facility-Administered Medications  Medication Dose Route Frequency Provider Last Rate Last Admin  . 0.9 % NaCl with KCl 20 mEq/ L  infusion   Intravenous Continuous Tat, Shanon Brow, MD 75 mL/hr at 12/27/20 1241 New Bag at 12/27/20 1241  . acetaminophen (TYLENOL) tablet 650 mg  650 mg Oral Q6H PRN Emokpae, Ejiroghene E, MD       Or  . acetaminophen (TYLENOL) suppository 650 mg  650 mg Rectal Q6H PRN Emokpae, Ejiroghene E, MD      . allopurinol (ZYLOPRIM) tablet 100 mg  100 mg Oral Daily Emokpae, Ejiroghene E, MD   100 mg at 12/27/20 0842  . gabapentin (NEURONTIN) capsule 300 mg  300 mg Oral BID Emokpae, Ejiroghene E, MD   300 mg at 12/27/20 0842  . insulin aspart (novoLOG) injection 0-5 Units  0-5 Units Subcutaneous QHS  Emokpae, Ejiroghene E, MD      . insulin aspart (novoLOG) injection 0-9 Units  0-9 Units Subcutaneous TID WC Emokpae, Ejiroghene E, MD      . memantine (NAMENDA) tablet 10 mg  10 mg Oral BID Emokpae, Ejiroghene E, MD   10 mg at 12/27/20 0842  . ondansetron (ZOFRAN) tablet 4 mg  4 mg Oral Q6H PRN Emokpae, Ejiroghene E, MD       Or  . ondansetron (ZOFRAN) injection 4 mg  4 mg Intravenous Q6H PRN Emokpae, Ejiroghene E, MD      . oxyCODONE-acetaminophen  (PERCOCET/ROXICET) 5-325 MG per tablet 1 tablet  1 tablet Oral Q6H PRN Emokpae, Ejiroghene E, MD   1 tablet at 12/27/20 0842  . polyethylene glycol (MIRALAX / GLYCOLAX) packet 17 g  17 g Oral Daily PRN Emokpae, Ejiroghene E, MD      . QUEtiapine (SEROQUEL) tablet 25 mg  25 mg Oral QHS Emokpae, Ejiroghene E, MD   25 mg at 12/26/20 2306  . spironolactone (ALDACTONE) tablet 25 mg  25 mg Oral Daily Emokpae, Ejiroghene E, MD   25 mg at 12/27/20 0842  . torsemide (DEMADEX) tablet 20 mg  20 mg Oral Daily Emokpae, Ejiroghene E, MD   20 mg at 12/27/20 5953     Discharge Medications: Please see discharge summary for a list of discharge medications.  Relevant Imaging Results:  Relevant Lab Results:   Additional Information SSN [id number], Clydene Pugh, LCSW

## 2020-12-27 NOTE — Progress Notes (Addendum)
Physical Therapy Evaluation Patient Details Name: Jasmine Bridges MRN: [id number] DOB: 09-17-1932 Today's Date: 12/27/2020   History of Present Illness  Jasmine Bridges is a 87 y.o. female with medical history significant for diabetes mellitus, hypertension.  Patient was brought to the ED via EMS reports of a fall today- patient rolled off her bed and hit her head on the nightstand, she sustained bruising to her face and a laceration head from this fall.  On my evaluation, patient is able to answer a few questions, but she has some mild baseline dementia but her mental status is at baseline.  Patient's daughter Jasmine Bridges is at bedside.  He tells me patient was found at about 9 AM this morning, usually patient gets up at 11 AM in the morning.  Unknown how long patient was on the floor or at what time patient fell last night.  Patient reports she rolled off her bed, and this has happened before.  Daughter reports patient is prone to falls, fallen several times in the past, but she has not fallen in the past 4 months.  Patient lives with her daughter Jasmine Bridges.  Only other family is in mother's daughter, but she works all day and cannot care for patient.  At baseline patient ambulates with a cane or walker.  Daughter was told by provider, that patient has mild dementia, but daughter reports patient occasionally has hallucinates, and sometimes confused.    Clinical Impression  Patient was found in bed and agreeable to therapy. Patient demonstrated slow labored movement in bed that required min/mod assistance to sit at EOB. While sitting at EOB patient required verbal and tactile cues to keep feet on the floor and min assist to keep her balance. Patient required moderate assist for a stand pivot transfer to chair, patient was able to take small side steps during transfer. Patient demonstrated good awareness in chair and choose to stay in the chair - nursing staff notified. Patient will benefit from continued physical therapy in  hospital and recommended venue below to increase strength, balance, endurance for safe ADLs and gait.     Follow Up Recommendations SNF    Equipment Recommendations  None recommended by PT    Recommendations for Other Services       Precautions / Restrictions Precautions Precautions: Fall;Cervical Required Braces or Orthoses: Cervical Brace Cervical Brace: Hard collar Restrictions Weight Bearing Restrictions: No      Mobility  Bed Mobility Overal bed mobility: Needs Assistance Bed Mobility: Supine to Sit     Supine to sit: Min assist;Mod assist       Patient Response: Cooperative  Transfers Overall transfer level: Needs assistance Equipment used: Standard walker Transfers: Stand Pivot Transfers;Sit to/from Stand Sit to Stand: Mod assist Stand pivot transfers: Mod assist          Ambulation/Gait Ambulation/Gait assistance: Mod assist Gait Distance (Feet): 4 Feet Assistive device: Standard walker Gait Pattern/deviations: Decreased step length - left;Decreased stance time - right;Decreased stride length Gait velocity: decreased      Stairs            Wheelchair Mobility    Modified Rankin (Stroke Patients Only)       Balance Overall balance assessment: Needs assistance Sitting-balance support: Bilateral upper extremity supported;Feet supported Sitting balance-Leahy Scale: Poor Sitting balance - Comments: fair/poor at EOB Postural control: Posterior lean Standing balance support: Bilateral upper extremity supported  Pertinent Vitals/Pain Pain Assessment: Faces Faces Pain Scale: Hurts little more Pain Location: neck, shoulder, hip Pain Descriptors / Indicators: Sore;Aching Pain Intervention(s): Limited activity within patient's tolerance;Repositioned    Home Living Family/patient expects to be discharged to:: Private residence Living Arrangements: Children Available Help at Discharge:  Available PRN/intermittently;Family Type of Home: House Home Access: Stairs to enter Entrance Stairs-Rails: Psychiatric nurse of Steps: 3-4 Home Layout: One level Home Equipment: Walker - 2 wheels;Cane - single point      Prior Function Level of Independence: Independent with assistive device(s)               Hand Dominance        Extremity/Trunk Assessment   Upper Extremity Assessment Upper Extremity Assessment: Generalized weakness    Lower Extremity Assessment Lower Extremity Assessment: Generalized weakness    Cervical / Trunk Assessment Cervical / Trunk Assessment: Kyphotic  Communication   Communication: No difficulties  Cognition Arousal/Alertness: Awake/alert Behavior During Therapy: WFL for tasks assessed/performed Overall Cognitive Status: Within Functional Limits for tasks assessed                                        General Comments      Exercises     Assessment/Plan    PT Assessment Patient needs continued PT services  PT Problem List Decreased strength;Decreased activity tolerance;Decreased balance;Decreased mobility;Decreased coordination       PT Treatment Interventions DME instruction;Gait training;Functional mobility training;Therapeutic activities;Therapeutic exercise;Balance training;Patient/family education    PT Goals (Current goals can be found in the Care Plan section)  Acute Rehab PT Goals Patient Stated Goal: return home PT Goal Formulation: With patient/family Time For Goal Achievement: 01/10/21 Potential to Achieve Goals: Good    Frequency Min 3X/week   Barriers to discharge        Co-evaluation               AM-PAC PT "6 Clicks" Mobility  Outcome Measure Help needed turning from your back to your side while in a flat bed without using bedrails?: A Little Help needed moving from lying on your back to sitting on the side of a flat bed without using bedrails?: A Little Help  needed moving to and from a bed to a chair (including a wheelchair)?: A Lot Help needed standing up from a chair using your arms (e.g., wheelchair or bedside chair)?: A Lot Help needed to walk in hospital room?: A Lot Help needed climbing 3-5 steps with a railing? : Total 6 Click Score: 13    End of Session   Activity Tolerance: Patient tolerated treatment well;Patient limited by fatigue;Patient limited by pain Patient left: in chair;with call bell/phone within reach;with bed alarm set;with family/visitor present Nurse Communication: Mobility status PT Visit Diagnosis: Unsteadiness on feet (R26.81);Other abnormalities of gait and mobility (R26.89);Muscle weakness (generalized) (M62.81)    Time: 8127-5170 PT Time Calculation (min) (ACUTE ONLY): 31 min   Charges:   PT Evaluation $PT Eval Moderate Complexity: 1 Mod PT Treatments $Therapeutic Activity: 23-37 mins        3:39 PM, 12/27/20 Jeneen Rinks Cousler SPT  3:40 PM, 12/27/20 Lonell Grandchild, MPT Physical Therapist with [facility] 336 [phone] office [phone] mobile phone

## 2020-12-27 NOTE — Progress Notes (Signed)
PROGRESS NOTE  Jasmine Bridges MGN:[id number] DOB: Jun 19, 1933 DOA: 12/26/2020 PCP: Celene Squibb, MD  Brief History:  87 y.o. female with medical history significant for DM2, HTN, HLD, CKD, Dementia, GERD presents after rolling out of bed.  She is a poor historian due to her dementia.  Daughter helps supplement the history.  To the patient's best knowledge, she tries to get out of bed and becomes unsteady resulting in a mechanical fall.  There is no syncope.  Last fall 4 months ago.  At baseline, patient is able to ambulate with a cane and walker, not needing assistance for transfers and only needing assistance with bathing. Daughter found patient on the floor around 9AM on day of admission.  It is unknown how long patient was on the floor, but felt that patient likely fell in middle of night.  Patient lives with her daughter [name]. Patient complained of neck and shoulder pain after fall.  She denies  fevers, chills, headache, chest pain, dyspnea, nausea, vomiting, diarrhea, abdominal pain.    ED Course: Heart rate 70s to 80s.  Potassium 3.1. Mag- 1.9, CK- 834.,  Cervical CT shows fracture to the right anterior C5-C6 bridging osteophytes with 3 mm of distraction.,  Also high density fluid and stranding in the soft tissues of the left neck concerning for a hemorrhagic contusion.   Pelvic and chest x-ray, maxillofacial and head CT without acute abnormality. EDP talked to neurosurgeon on-call Dr. Marcello Moores, who recommended c-collar to be in place for 6 to 12 weeks and patient may follow-up in office in 3 to 4 weeks.  Also regarding hemorrhagic contusion of the left neck, as patient does not have hematoma or compression of underlying structures on exam this is less concerning. Initial plan was to discharge patient from the ED, but with worsening neck pain, and neck collar, patient requiring more care, and her daughter who is her caregiver is unable to provide this. She is also worried patient will continue  to fall out of bed.    Assessment/Plan: Nontraumatic rhabdomyolysis -CK peaked 1178 -continue IVF -am CK  Cervical Spine fracture -CT c-spine-->Fracture through a right anterior C5-6 bridging osteophyte with 3 mm of distraction. High-density fluid and stranding in the soft tissues of the left neck concerning for a hemorrhagic contusion. - EDP talked to neurosurgeon on-call Dr. Marcello Moores, recommended c-collar to be in place for 6 to 12 weeks and patient may follow-up in office in 3 to 4 weeks.  Also regarding hemorrhagic contusion of the left neck, as patient does not have hematoma or compression of underlying structures on exam this is less concerning. -PT-->SNF PT/OT evaluations have been performed.  SNF is recommended.  SNF is appropriate as the patient has received 3 days of hospital care (or the 3 day period has bee waived by the patient's insurance company) and is felt to need rehab services to restore this patient to their prior level of function to achieve safe transition back to home care.  This patient needs rehab services for at least 5 days per week and skilled nursing services daily to facilitate this transition.  Rehab is being requested as the most appropriate discharge option for this patient and  NOT felt to be for custodial care as evidenced by patient's ability to perform ADLs with minimal to no assistance  Hypokalemia -replete -mag 1.9  Diet controlled diabetes mellitus type 2 -check A1C -novolog sliding scale  Essential Hypertension -hold torsemide  and spironolactone temporarily  Dementia -continue seroquel and namenda  Leg pain/edema -venous duplex    Status is: inpatient  The patient will require care spanning > 2 midnights and should be moved to inpatient because: Persistent severe electrolyte disturbances  Dispo: The patient is from: Home              Anticipated d/c is to: SNF              Patient currently is not medically stable to d/c.   Difficult to  place patient No        Family Communication:   Daughter updated at bedside 6/2  Consultants:  none  Code Status: DNR  DVT Prophylaxis:  [place] Heparin / [place] Lovenox   Procedures: As Listed in Progress Note Above  Antibiotics: None      Subjective: Patient denies fevers, chills, headache, chest pain, dyspnea, nausea, vomiting, diarrhea, abdominal pain, dysuria   Objective: Vitals:   12/26/20 2053 12/27/20 0602 12/27/20 0723 12/27/20 1332  BP: (!) 164/77 (!) 147/74 140/63 127/73  Pulse: 88 71 81 74  Resp: 20 19 18 18   Temp: 98.2 F (36.8 C) 98.5 F (36.9 C) 97.8 F (36.6 C) 98.6 F (37 C)  TempSrc: Oral  Oral Oral  SpO2: 100% 99% 100% 97%  Weight: 62.5 kg     Height: 5\' 7"  (1.702 m)       Intake/Output Summary (Last 24 hours) at 12/27/2020 1709 Last data filed at 12/27/2020 0851 Gross per 24 hour  Intake 476 ml  Output 250 ml  Net 226 ml   Weight change:  Exam:   General:  Pt is alert, follows commands appropriately, not in acute distress  HEENT: No icterus, No thrush, No neck mass, [place]/AT  Cardiovascular: RRR, S1/S2, no rubs, no gallops  Respiratory: CTA bilaterally, no wheezing, no crackles, no rhonchi  Abdomen: Soft/+BS, non tender, non distended, no guarding  Extremities: 1+LE edema, No lymphangitis, No petechiae, No rashes, no synovitis   Data Reviewed: I have personally reviewed following labs and imaging studies Basic Metabolic Panel: Recent Labs  Lab 12/26/20 1314  NA 137  K 3.1*  CL 105  CO2 23  GLUCOSE 156*  BUN 19  CREATININE 1.17*  CALCIUM 9.0  MG 1.9   Liver Function Tests: Recent Labs  Lab 12/26/20 1314  AST 28  ALT 12  ALKPHOS 57  BILITOT 0.7  PROT 6.9  ALBUMIN 3.6   No results for input(s): LIPASE, AMYLASE in the last 168 hours. No results for input(s): AMMONIA in the last 168 hours. Coagulation Profile: Recent Labs  Lab 12/26/20 1314  INR 1.0   CBC: Recent Labs  Lab 12/26/20 1314 12/27/20 0710  WBC  9.4 8.1  NEUTROABS 7.5  --   HGB 12.5 11.6*  HCT 36.9 35.0*  MCV 90.7 91.6  PLT 181 163   Cardiac Enzymes: Recent Labs  Lab 12/26/20 1314 12/26/20 1741  CKTOTAL 834* 1,178*   BNP: Invalid input(s): POCBNP CBG: Recent Labs  Lab 12/26/20 2313 12/27/20 1116 12/27/20 1634  GLUCAP 99 110* 78   HbA1C: No results for input(s): HGBA1C in the last 72 hours. Urine analysis: No results found for: COLORURINE, APPEARANCEUR, LABSPEC, PHURINE, GLUCOSEU, HGBUR, BILIRUBINUR, KETONESUR, PROTEINUR, UROBILINOGEN, NITRITE, LEUKOCYTESUR Sepsis Labs: @LABRCNTIP (procalcitonin:4,lacticidven:4) ) Recent Results (from the past 240 hour(s))  Resp Panel by RT-PCR (Flu A&B, Covid) Nasopharyngeal Swab     Status: None   Collection Time: 12/26/20 12:23 PM   Specimen: Nasopharyngeal Swab;  Nasopharyngeal(NP) swabs in vial transport medium  Result Value Ref Range Status   SARS Coronavirus 2 by RT PCR NEGATIVE NEGATIVE Final    Comment: (NOTE) SARS-CoV-2 target nucleic acids are NOT DETECTED.  The SARS-CoV-2 RNA is generally detectable in upper respiratory specimens during the acute phase of infection. The lowest concentration of SARS-CoV-2 viral copies this assay can detect is 138 copies/mL. A negative result does not preclude SARS-Cov-2 infection and should not be used as the sole basis for treatment or other patient management decisions. A negative result may occur with  improper specimen collection/handling, submission of specimen other than nasopharyngeal swab, presence of viral mutation(s) within the areas targeted by this assay, and inadequate number of viral copies(<138 copies/mL). A negative result must be combined with clinical observations, patient history, and epidemiological information. The expected result is Negative.  Fact Sheet for Patients:  EntrepreneurPulse.com.au  Fact Sheet for Healthcare Providers:  IncredibleEmployment.be  This test is  no t yet approved or cleared by the [place] FDA and  has been authorized for detection and/or diagnosis of SARS-CoV-2 by FDA under an Emergency Use Authorization (EUA). This EUA will remain  in effect (meaning this test can be used) for the duration of the COVID-19 declaration under Section 564(b)(1) of the Act, 21 U.S.C.section 360bbb-3(b)(1), unless the authorization is terminated  or revoked sooner.       Influenza A by PCR NEGATIVE NEGATIVE Final   Influenza B by PCR NEGATIVE NEGATIVE Final    Comment: (NOTE) The Xpert Xpress SARS-CoV-2/FLU/RSV plus assay is intended as an aid in the diagnosis of influenza from Nasopharyngeal swab specimens and should not be used as a sole basis for treatment. Nasal washings and aspirates are unacceptable for Xpert Xpress SARS-CoV-2/FLU/RSV testing.  Fact Sheet for Patients: EntrepreneurPulse.com.au  Fact Sheet for Healthcare Providers: IncredibleEmployment.be  This test is not yet approved or cleared by the [place] FDA and has been authorized for detection and/or diagnosis of SARS-CoV-2 by FDA under an Emergency Use Authorization (EUA). This EUA will remain in effect (meaning this test can be used) for the duration of the COVID-19 declaration under Section 564(b)(1) of the Act, 21 U.S.C. section 360bbb-3(b)(1), unless the authorization is terminated or revoked.  Performed at [facility], [address], [place], [place] [address]      Scheduled Meds: . allopurinol  100 mg Oral Daily  . gabapentin  300 mg Oral BID  . insulin aspart  0-5 Units Subcutaneous QHS  . insulin aspart  0-9 Units Subcutaneous TID WC  . memantine  10 mg Oral BID  . QUEtiapine  25 mg Oral QHS  . spironolactone  25 mg Oral Daily  . torsemide  20 mg Oral Daily   Continuous Infusions: . 0.9 % NaCl with KCl 20 mEq / L 75 mL/hr at 12/27/20 1241    Procedures/Studies: DG Ribs Unilateral W/Chest Left  Result Date:  12/26/2020 CLINICAL DATA:  Status post fall. EXAM: LEFT RIBS AND CHEST - 3+ VIEW COMPARISON:  None. FINDINGS: No fracture or other bone lesions are seen involving the ribs. There is no evidence of pneumothorax or pleural effusion. Both lungs are clear. Heart size and mediastinal contours are within normal limits. IMPRESSION: Negative. Electronically Signed   By: Kathreen Devoid   On: 12/26/2020 13:18   CT HEAD WO CONTRAST  Result Date: 12/26/2020 CLINICAL DATA:  Status post fall.  Hit head on nightstand. EXAM: CT HEAD WITHOUT CONTRAST CT CERVICAL SPINE WITHOUT CONTRAST TECHNIQUE: Multidetector CT  imaging of the head and cervical spine was performed following the standard protocol without intravenous contrast. Multiplanar CT image reconstructions of the cervical spine were also generated. COMPARISON:  None. FINDINGS: Brain: No evidence of acute infarction, hemorrhage, extra-axial collection, or mass effect. Mild ventriculomegaly commensurate with the degree of cerebral atrophy. Generalized cerebral atrophy. Periventricular white matter low attenuation likely secondary to microangiopathy. Vascular: Cerebrovascular atherosclerotic calcifications are noted. No hyperdense vessels. Skull: Negative for fracture or focal lesion. Sinuses/Orbits: Visualized portions of the orbits are unremarkable. Visualized portions of the paranasal sinuses are unremarkable. Visualized portions of the mastoid air cells are unremarkable. Other: None. CT CERVICAL SPINE FINDINGS Alignment: Normal. Skull base and vertebrae: Fracture through a right anterior C5-6 bridging osteophyte with 3 mm of distraction. No primary bone lesion or focal pathologic process. Soft tissues and spinal canal: No prevertebral fluid or swelling. No visible canal hematoma. Disc levels: Degenerative disease with disc height loss at C4-5, C5-6 and C6-7 and C7-T1. Acquired ankylosis across the C4-5 disc space. Ankylosis of the posterior elements at C3-4 C4-5. At C3-4 there  is a broad-based disc osteophyte complex. Low left uncovertebral degenerative changes. Moderate right and severe left facet arthropathy. Severe left foraminal stenosis. Mild right foraminal stenosis. At C4-5 there is interbody fusion. Moderate right and mild left facet arthropathy. Moderate right foraminal stenosis. No left foraminal stenosis. At C5-6 there is a broad-based disc osteophyte complex. Uncovertebral degenerative changes bilaterally. Severe right foraminal stenosis. Mild left foraminal stenosis. At C6-7 there is a broad-based disc osteophyte complex. Bilateral uncovertebral degenerative changes. Moderate bilateral foraminal stenosis. At C7-T1 there is a broad-based disc bulge. Bilateral uncovertebral degenerative changes. Severe bilateral foraminal stenosis. Upper chest: Lung apices are clear. Other: High-density fluid and stranding in the soft tissues of the left neck concerning for a hemorrhagic contusion. IMPRESSION: 1. No acute intracranial pathology. 2. Fracture through a right anterior C5-6 bridging osteophyte with 3 mm of distraction. 3. High-density fluid and stranding in the soft tissues of the left neck concerning for a hemorrhagic contusion. 4. Cervical spine spondylosis as described above. Critical Value/emergent results were called by telephone at the time of interpretation on 12/26/2020 at 1:58 pm to provider [facility] , who verbally acknowledged these results. Electronically Signed   By: Kathreen Devoid   On: 12/26/2020 13:59   CT CERVICAL SPINE WO CONTRAST  Result Date: 12/26/2020 CLINICAL DATA:  Status post fall.  Hit head on nightstand. EXAM: CT HEAD WITHOUT CONTRAST CT CERVICAL SPINE WITHOUT CONTRAST TECHNIQUE: Multidetector CT imaging of the head and cervical spine was performed following the standard protocol without intravenous contrast. Multiplanar CT image reconstructions of the cervical spine were also generated. COMPARISON:  None. FINDINGS: Brain: No evidence of acute  infarction, hemorrhage, extra-axial collection, or mass effect. Mild ventriculomegaly commensurate with the degree of cerebral atrophy. Generalized cerebral atrophy. Periventricular white matter low attenuation likely secondary to microangiopathy. Vascular: Cerebrovascular atherosclerotic calcifications are noted. No hyperdense vessels. Skull: Negative for fracture or focal lesion. Sinuses/Orbits: Visualized portions of the orbits are unremarkable. Visualized portions of the paranasal sinuses are unremarkable. Visualized portions of the mastoid air cells are unremarkable. Other: None. CT CERVICAL SPINE FINDINGS Alignment: Normal. Skull base and vertebrae: Fracture through a right anterior C5-6 bridging osteophyte with 3 mm of distraction. No primary bone lesion or focal pathologic process. Soft tissues and spinal canal: No prevertebral fluid or swelling. No visible canal hematoma. Disc levels: Degenerative disease with disc height loss at C4-5, C5-6 and C6-7 and C7-T1. Acquired  ankylosis across the C4-5 disc space. Ankylosis of the posterior elements at C3-4 C4-5. At C3-4 there is a broad-based disc osteophyte complex. Low left uncovertebral degenerative changes. Moderate right and severe left facet arthropathy. Severe left foraminal stenosis. Mild right foraminal stenosis. At C4-5 there is interbody fusion. Moderate right and mild left facet arthropathy. Moderate right foraminal stenosis. No left foraminal stenosis. At C5-6 there is a broad-based disc osteophyte complex. Uncovertebral degenerative changes bilaterally. Severe right foraminal stenosis. Mild left foraminal stenosis. At C6-7 there is a broad-based disc osteophyte complex. Bilateral uncovertebral degenerative changes. Moderate bilateral foraminal stenosis. At C7-T1 there is a broad-based disc bulge. Bilateral uncovertebral degenerative changes. Severe bilateral foraminal stenosis. Upper chest: Lung apices are clear. Other: High-density fluid and  stranding in the soft tissues of the left neck concerning for a hemorrhagic contusion. IMPRESSION: 1. No acute intracranial pathology. 2. Fracture through a right anterior C5-6 bridging osteophyte with 3 mm of distraction. 3. High-density fluid and stranding in the soft tissues of the left neck concerning for a hemorrhagic contusion. 4. Cervical spine spondylosis as described above. Critical Value/emergent results were called by telephone at the time of interpretation on 12/26/2020 at 1:58 pm to provider [facility] , who verbally acknowledged these results. Electronically Signed   By: Kathreen Devoid   On: 12/26/2020 13:59   DG Shoulder Left  Result Date: 12/26/2020 CLINICAL DATA:  Left shoulder pain after fall today. EXAM: LEFT SHOULDER - 2+ VIEW COMPARISON:  None. FINDINGS: There is no evidence of fracture or dislocation. There is no evidence of arthropathy or other focal bone abnormality. Soft tissues are unremarkable. IMPRESSION: Negative. Electronically Signed   By: Marijo Conception M.D.   On: 12/26/2020 15:10   DG Humerus Left  Result Date: 12/26/2020 CLINICAL DATA:  Left arm pain after fall today. EXAM: LEFT HUMERUS - 2+ VIEW COMPARISON:  None. FINDINGS: There is no evidence of fracture or other focal bone lesions. Soft tissues are unremarkable. IMPRESSION: Negative. Electronically Signed   By: Marijo Conception M.D.   On: 12/26/2020 15:11   DG Hip Unilat W or Wo Pelvis 2-3 Views Right  Result Date: 12/26/2020 CLINICAL DATA:  Fall. EXAM: DG HIP (WITH OR WITHOUT PELVIS) 2-3V RIGHT COMPARISON:  06/13/2003. FINDINGS: Bilateral hip replacements. Hardware intact. Anatomic alignment. Prior lumbar spine fusion. Diffuse osteopenia. Degenerative changes lumbar spine and both hips. No acute bony abnormality identified. Specifically the right hip is intact. IMPRESSION: 1. Bilateral hip replacements. Prior lumbar spine fusion. Hardware intact. 2. Diffuse osteopenia. Degenerative changes lumbar spine and both SI  joints. 3. No acute bony abnormality. Specifically the right hip is intact. Electronically Signed   By: Marcello Moores  Register   On: 12/26/2020 13:18   CT Maxillofacial WO CM  Result Date: 12/26/2020 CLINICAL DATA:  Head trauma, fell off bed striking head on nightstand, laceration LEFT frontal, denies loss of consciousness, has cervical spine fracture EXAM: CT MAXILLOFACIAL WITHOUT CONTRAST TECHNIQUE: Multidetector CT imaging of the maxillofacial structures was performed. Multiplanar CT image reconstructions were also generated. Right side of face marked with BB. COMPARISON:  None FINDINGS: Osseous: Osseous demineralization. Scattered beam hardening artifacts of dental origin. Facial bones intact. No facial bone fractures identified. Orbits: Bony orbits intact. Intraorbital soft tissue planes clear. No orbital pneumatosis or fluid. Sinuses: Mild nasal septal deviation to the RIGHT. Paranasal sinuses clear. Mastoid air cells and middle ear cavities clear. Soft tissues: LEFT facial soft tissue swelling/contusion and skin thickening. Mild atherosclerotic calcifications at carotid bifurcations  Limited intracranial: Unremarkable IMPRESSION: No acute facial bone abnormalities. Electronically Signed   By: Lavonia Dana M.D.   On: 12/26/2020 15:00    Orson Eva, DO  [facility]  If 7PM-7AM, please contact night-coverage [email or web address] Password [email or web address] 12/27/2020, 5:09 PM   LOS: 0 days

## 2020-12-27 NOTE — Plan of Care (Addendum)
  Problem: Acute Rehab PT Goals(only PT should resolve) Goal: Pt Will Go Supine/Side To Sit Outcome: Progressing Flowsheets (Taken 12/27/2020 1532) Pt will go Supine/Side to Sit: . with min guard assist . with supervision Goal: Patient Will Perform Sitting Balance Outcome: Progressing Flowsheets (Taken 12/27/2020 1532) Patient will perform sitting balance: . with min guard assist . with supervision . 1-2 min . with no UE support Goal: Patient Will Transfer Sit To/From Stand Outcome: Progressing Flowsheets (Taken 12/27/2020 1532) Patient will transfer sit to/from stand: with minimal assist Goal: Pt Will Transfer Bed To Chair/Chair To Bed Outcome: Progressing Flowsheets (Taken 12/27/2020 1532) Pt will Transfer Bed to Chair/Chair to Bed: min guard assist Goal: Pt Will Ambulate Outcome: Progressing Flowsheets (Taken 12/27/2020 1532) Pt will Ambulate: . 15 feet . with min guard assist . with rolling walker . with cues (comment type and amount)  3:33 PM, 12/27/20 Sinclair Ship SPT  3:38 PM, 12/27/20 Lonell Grandchild, MPT Physical Therapist with [facility] [phone] office [phone] mobile phone

## 2020-12-27 NOTE — TOC Initial Note (Signed)
Transition of Care [facility]) - Initial/Assessment Note    Patient Details  Name: Jasmine Bridges MRN: [id number] Date of Birth: February 24, 1933  Transition of [facility] CM/SW Contact:    Ihor Gully, LCSW Phone Number: 12/27/2020, 4:31 PM  Clinical Narrative:                 Patient from home with daughter, Jasmine Bridges. Ambulates with a cane and a walker (mainly cane). Has [place] and toilet riser. Daughter assists with ADLs. PT evaluation discussed with daughter. Agreeable to SNF. Referred to SNFs of choice.   Expected Discharge Plan: Skilled Nursing Facility Barriers to Discharge: Continued Medical Work up   Patient Goals and CMS Choice     Choice offered to / list presented to : Adult Children  Expected Discharge Plan and Services Expected Discharge Plan: [place] Choice: [place] Living arrangements for the past 2 months: Single Family Home                                      Prior Living Arrangements/Services Living arrangements for the past 2 months: Single Family Home Lives with:: Adult Children Patient language and need for interpreter reviewed:: Yes Do you feel safe going back to the place where you live?: Yes      Need for Family Participation in Patient Care: Yes (Comment) Care giver support system in place?: Yes (comment) Current home services: DME Criminal Activity/Legal Involvement Pertinent to Current Situation/Hospitalization: No - Comment as needed  Activities of Daily Living      Permission Sought/Granted Permission sought to share information with : Family Supports    Share Information with NAME: [name], daughter           Emotional Assessment     Affect (typically observed): Appropriate Orientation: : Oriented to Self Alcohol / Substance Use: Not Applicable Psych Involvement: No (comment)  Admission diagnosis:  Fall [W19.XXXA] Fall, initial encounter [id number]] Patient Active Problem  List   Diagnosis Date Noted  . Fall 12/26/2020  . HTN (hypertension) 12/26/2020  . Cervical Osteophyte Fracture 12/26/2020  . Routine gynecological examination 01/04/2014  . Unspecified disorder of kidney and ureter 12/27/2013  . DM (diabetes mellitus) ([place]) 12/27/2013  . Pain in limb 12/27/2013  . Unspecified vitamin D deficiency 12/27/2013  . Edema 12/27/2013  . Other and unspecified hyperlipidemia 12/27/2013  . Cramp of limb 12/27/2013  . Esophageal reflux 12/27/2013  . Rash and other nonspecific skin eruption 12/27/2013  . Backache, unspecified 12/27/2013  . Anxiety state, unspecified 12/27/2013  . Shortness of breath 12/27/2013  . Osteoporosis, unspecified 12/27/2013  . 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 Chronic kidney disease, unspecified 12/27/2013  . Other bursitis disorders 12/27/2013  . DYSPNEA 08/16/2008   PCP:  Celene Squibb, MD Pharmacy:   [facility], [place] [address] Phone: [phone] Fax: [phone]  [facility] - [place], [place] - [address] [place] [address] Phone: [phone] Fax: [phone]     Social Determinants of Health (SDOH) Interventions    Readmission Risk Interventions No flowsheet data found.

## 2020-12-28 ENCOUNTER — Observation Stay (HOSPITAL_COMMUNITY): Payer: Medicare HMO

## 2020-12-28 ENCOUNTER — Inpatient Hospital Stay
Admission: RE | Admit: 2020-12-28 | Discharge: 2021-02-11 | Disposition: A | Discharge status: Home / Self Care | Payer: Medicare HMO | Source: Ambulatory Visit | Attending: Internal Medicine | Admitting: Internal Medicine

## 2020-12-28 DIAGNOSIS — K219 Gastro-esophageal reflux disease without esophagitis: Secondary | ICD-10-CM | POA: Diagnosis not present

## 2020-12-28 DIAGNOSIS — N1832 Chronic kidney disease, stage 3b: Secondary | ICD-10-CM | POA: Diagnosis not present

## 2020-12-28 DIAGNOSIS — S12501A Unspecified nondisplaced fracture of sixth cervical vertebra, initial encounter for closed fracture: Secondary | ICD-10-CM | POA: Diagnosis not present

## 2020-12-28 DIAGNOSIS — D638 Anemia in other chronic diseases classified elsewhere: Secondary | ICD-10-CM | POA: Diagnosis not present

## 2020-12-28 DIAGNOSIS — I129 Hypertensive chronic kidney disease with stage 1 through stage 4 chronic kidney disease, or unspecified chronic kidney disease: Secondary | ICD-10-CM | POA: Diagnosis not present

## 2020-12-28 DIAGNOSIS — E1142 Type 2 diabetes mellitus with diabetic polyneuropathy: Secondary | ICD-10-CM | POA: Diagnosis not present

## 2020-12-28 DIAGNOSIS — E1122 Type 2 diabetes mellitus with diabetic chronic kidney disease: Secondary | ICD-10-CM | POA: Diagnosis not present

## 2020-12-28 DIAGNOSIS — Z79899 Other long term (current) drug therapy: Secondary | ICD-10-CM | POA: Diagnosis not present

## 2020-12-28 DIAGNOSIS — R32 Unspecified urinary incontinence: Secondary | ICD-10-CM | POA: Diagnosis not present

## 2020-12-28 DIAGNOSIS — M1A30X0 Chronic gout due to renal impairment, unspecified site, without tophus (tophi): Secondary | ICD-10-CM | POA: Diagnosis not present

## 2020-12-28 DIAGNOSIS — S12490D Other displaced fracture of fifth cervical vertebra, subsequent encounter for fracture with routine healing: Secondary | ICD-10-CM | POA: Diagnosis not present

## 2020-12-28 DIAGNOSIS — M79605 Pain in left leg: Secondary | ICD-10-CM | POA: Diagnosis not present

## 2020-12-28 DIAGNOSIS — F0390 Unspecified dementia without behavioral disturbance: Secondary | ICD-10-CM | POA: Diagnosis not present

## 2020-12-28 DIAGNOSIS — N189 Chronic kidney disease, unspecified: Principal | ICD-10-CM

## 2020-12-28 DIAGNOSIS — N281 Cyst of kidney, acquired: Secondary | ICD-10-CM | POA: Diagnosis not present

## 2020-12-28 DIAGNOSIS — T796XXS Traumatic ischemia of muscle, sequela: Secondary | ICD-10-CM | POA: Diagnosis not present

## 2020-12-28 DIAGNOSIS — M810 Age-related osteoporosis without current pathological fracture: Secondary | ICD-10-CM | POA: Diagnosis not present

## 2020-12-28 DIAGNOSIS — N170 Acute kidney failure with tubular necrosis: Secondary | ICD-10-CM | POA: Diagnosis not present

## 2020-12-28 DIAGNOSIS — N183 Chronic kidney disease, stage 3 unspecified: Secondary | ICD-10-CM | POA: Diagnosis not present

## 2020-12-28 DIAGNOSIS — Z23 Encounter for immunization: Secondary | ICD-10-CM | POA: Diagnosis not present

## 2020-12-28 DIAGNOSIS — M79604 Pain in right leg: Secondary | ICD-10-CM | POA: Diagnosis not present

## 2020-12-28 DIAGNOSIS — W06XXXD Fall from bed, subsequent encounter: Secondary | ICD-10-CM | POA: Diagnosis not present

## 2020-12-28 DIAGNOSIS — S12500A Unspecified displaced fracture of sixth cervical vertebra, initial encounter for closed fracture: Secondary | ICD-10-CM | POA: Diagnosis not present

## 2020-12-28 DIAGNOSIS — E785 Hyperlipidemia, unspecified: Secondary | ICD-10-CM | POA: Diagnosis not present

## 2020-12-28 DIAGNOSIS — M6281 Muscle weakness (generalized): Secondary | ICD-10-CM | POA: Diagnosis not present

## 2020-12-28 DIAGNOSIS — W19XXXA Unspecified fall, initial encounter: Secondary | ICD-10-CM | POA: Diagnosis not present

## 2020-12-28 DIAGNOSIS — I10 Essential (primary) hypertension: Secondary | ICD-10-CM | POA: Diagnosis not present

## 2020-12-28 DIAGNOSIS — W19XXXD Unspecified fall, subsequent encounter: Secondary | ICD-10-CM | POA: Diagnosis not present

## 2020-12-28 DIAGNOSIS — R600 Localized edema: Secondary | ICD-10-CM | POA: Diagnosis not present

## 2020-12-28 DIAGNOSIS — S12400A Unspecified displaced fracture of fifth cervical vertebra, initial encounter for closed fracture: Secondary | ICD-10-CM | POA: Diagnosis not present

## 2020-12-28 DIAGNOSIS — R030 Elevated blood-pressure reading, without diagnosis of hypertension: Secondary | ICD-10-CM | POA: Diagnosis not present

## 2020-12-28 DIAGNOSIS — D472 Monoclonal gammopathy: Secondary | ICD-10-CM | POA: Diagnosis not present

## 2020-12-28 DIAGNOSIS — F0150 Vascular dementia without behavioral disturbance: Secondary | ICD-10-CM | POA: Diagnosis not present

## 2020-12-28 DIAGNOSIS — Z20822 Contact with and (suspected) exposure to covid-19: Secondary | ICD-10-CM | POA: Diagnosis not present

## 2020-12-28 DIAGNOSIS — M6282 Rhabdomyolysis: Secondary | ICD-10-CM | POA: Diagnosis not present

## 2020-12-28 DIAGNOSIS — E876 Hypokalemia: Secondary | ICD-10-CM | POA: Diagnosis not present

## 2020-12-28 DIAGNOSIS — M2578 Osteophyte, vertebrae: Secondary | ICD-10-CM | POA: Diagnosis not present

## 2020-12-28 LAB — GLUCOSE, CAPILLARY
Glucose-Capillary: 87 mg/dL (ref 70–99)
Glucose-Capillary: 102 mg/dL — ABNORMAL HIGH (ref 70–99)
Glucose-Capillary: 75 mg/dL (ref 70–99)
Glucose-Capillary: 85 mg/dL (ref 70–99)
Glucose-Capillary: 85 mg/dL (ref 70–99)

## 2020-12-28 LAB — BASIC METABOLIC PANEL
Anion gap: 6 (ref 5–15)
BUN: 21 mg/dL (ref 8–23)
CO2: 26 mmol/L (ref 22–32)
Calcium: 9 mg/dL (ref 8.9–10.3)
Chloride: 104 mmol/L (ref 98–111)
Creatinine, Ser: 1.22 mg/dL — ABNORMAL HIGH (ref 0.44–1.00)
GFR, Estimated: 43 mL/min — ABNORMAL LOW (ref >60)
Glucose, Bld: 92 mg/dL (ref 70–99)
Potassium: 4.1 mmol/L (ref 3.5–5.1)
Sodium: 136 mmol/L (ref 135–145)

## 2020-12-28 LAB — CK: Total CK: 975 U/L — ABNORMAL HIGH (ref 38–234)

## 2020-12-28 MED ORDER — HYDROCODONE-ACETAMINOPHEN 5-325 MG PO TABS: 1.0000 | ORAL_TABLET | Freq: Four times a day (QID) | ORAL | 0 refills | Status: DC | PRN

## 2020-12-28 MED ORDER — HYDROCODONE-ACETAMINOPHEN 5-325 MG PO TABS: 1.0000 | ORAL_TABLET | Freq: Four times a day (QID) | ORAL | Status: DC | PRN

## 2020-12-28 NOTE — TOC Progression Note (Signed)
Transition of Care [facility]) - Progression Note    Patient Details  Name: Jasmine Bridges MRN: [id number] Date of Birth: 1932/08/22  Transition of Care [facility]) CM/SW Contact  Natasha Bence, LCSW Phone Number: 12/28/2020, 10:33 AM  Clinical Narrative:    Patient and patient's family agreeable to [facility]. Kerri with [facility] agreeable to start insurance auth and take patient once auth received. TOC to follow.    Expected Discharge Plan: [place] Barriers to Discharge: Continued Medical Work up  Expected Discharge Plan and Services Expected Discharge Plan: [place] Choice: [place] arrangements for the past 2 months: Single Family Home                                       Social Determinants of Health (SDOH) Interventions    Readmission Risk Interventions No flowsheet data found.

## 2020-12-28 NOTE — Progress Notes (Signed)
Physical Therapy Treatment Patient Details Name: Jasmine Bridges MRN: [id number] DOB: [birth year] Today's Date: 12/28/2020    History of Present Illness Jasmine Bridges is a 87 y.o. female with medical history significant for diabetes mellitus, hypertension.  Patient was brought to the ED via EMS reports of a fall today- patient rolled off her bed and hit her head on the nightstand, she sustained bruising to her face and a laceration head from this fall.  On my evaluation, patient is able to answer a few questions, but she has some mild baseline dementia but her mental status is at baseline.  Patient's daughter [name] is at bedside.  He tells me patient was found at about 9 AM this morning, usually patient gets up at 11 AM in the morning.  Unknown how long patient was on the floor or at what time patient fell last night.  Patient reports she rolled off her bed, and this has happened before.  Daughter reports patient is prone to falls, fallen several times in the past, but she has not fallen in the past 4 months.  Patient lives with her daughter [name].  Only other family is in mother's daughter, but she works all day and cannot care for patient.  At baseline patient ambulates with a cane or walker.  Daughter was told by provider, that patient has mild dementia, but daughter reports patient occasionally has hallucinates, and sometimes confused.    PT Comments    Patient requires frequent cueing and demonstration for mechanics of supine exercises with fair carry over. Patient completes bed mobility with slow, labored movements and requires assist to fully transition to seated EOB. She demonstrates good sitting balance and sitting tolerance today and completes seated exercise. Patient transfers to standing with RW and assist and requires both for standing balance. Patient transfers from sit to stand twice with improving standing tolerance on second attempt. Patient limited by fatigue today and is assisted back to  supine. Patient will benefit from continued physical therapy in hospital and recommended venue below to increase strength, balance, endurance for safe ADLs and gait.   Follow Up Recommendations  SNF     Equipment Recommendations  None recommended by PT    Recommendations for Other Services       Precautions / Restrictions Precautions Precautions: Fall;Cervical Required Braces or Orthoses: Cervical Brace Cervical Brace: Hard collar Restrictions Weight Bearing Restrictions: No    Mobility  Bed Mobility Overal bed mobility: Needs Assistance Bed Mobility: Supine to Sit;Sit to Supine     Supine to sit: Min assist Sit to supine: Modified independent (Device/Increase time)   General bed mobility comments: to pull to seated, cueing for scooting    Transfers Overall transfer level: Needs assistance Equipment used: Rolling walker (2 wheeled) Transfers: Sit to/from Stand Sit to Stand: Min assist;Mod assist         General transfer comment: transfer to standing with RW for LE weakness  Ambulation/Gait                 Stairs             Wheelchair Mobility    Modified Rankin (Stroke Patients Only)       Balance Overall balance assessment: Needs assistance Sitting-balance support: Bilateral upper extremity supported;Feet supported Sitting balance-Leahy Scale: Good Sitting balance - Comments: seated EOB   Standing balance support: Bilateral upper extremity supported Standing balance-Leahy Scale: Fair Standing balance comment: fair/poor with RW  Cognition Arousal/Alertness: Awake/alert Behavior During Therapy: WFL for tasks assessed/performed Overall Cognitive Status: Within Functional Limits for tasks assessed                                        Exercises General Exercises - Lower Extremity Ankle Circles/Pumps: AROM;Both;20 reps;Supine Quad Sets: AROM;Both;10 reps;Supine Heel Slides:  AROM;Both;10 reps;Supine Hip Flexion/Marching: AROM;Both;10 reps;Seated    General Comments        Pertinent Vitals/Pain Pain Assessment: Faces Faces Pain Scale: Hurts little more Pain Location: neck, shoulder, hip Pain Descriptors / Indicators: Sore;Aching Pain Intervention(s): Limited activity within patient's tolerance;Monitored during session;Repositioned    Home Living                      Prior Function            PT Goals (current goals can now be found in the care plan section) Acute Rehab PT Goals Patient Stated Goal: return home PT Goal Formulation: With patient/family Time For Goal Achievement: 01/10/21 Potential to Achieve Goals: Good Progress towards PT goals: Progressing toward goals    Frequency    Min 3X/week      PT Plan Current plan remains appropriate    Co-evaluation              AM-PAC PT "6 Clicks" Mobility   Outcome Measure  Help needed turning from your back to your side while in a flat bed without using bedrails?: A Little Help needed moving from lying on your back to sitting on the side of a flat bed without using bedrails?: A Little Help needed moving to and from a bed to a chair (including a wheelchair)?: A Lot Help needed standing up from a chair using your arms (e.g., wheelchair or bedside chair)?: A Lot Help needed to walk in hospital room?: A Lot Help needed climbing 3-5 steps with a railing? : Total 6 Click Score: 13    End of Session Equipment Utilized During Treatment: Gait belt Activity Tolerance: Patient tolerated treatment well;Patient limited by fatigue;Patient limited by pain Patient left: with call bell/phone within reach;with family/visitor present;in bed Nurse Communication: Mobility status PT Visit Diagnosis: Unsteadiness on feet (R26.81);Other abnormalities of gait and mobility (R26.89);Muscle weakness (generalized) (M62.81)     Time: 4496-7591 PT Time Calculation (min) (ACUTE ONLY): 30  min  Charges:  $Therapeutic Exercise: 8-22 mins $Therapeutic Activity: 8-22 mins                     2:19 PM, 12/28/20 Mearl Latin PT, DPT Physical Therapist at [facility]

## 2020-12-28 NOTE — Discharge Summary (Signed)
Physician Discharge Summary  Jasmine Bridges HKV:[id number] DOB: Jun 01, 1933 DOA: 12/26/2020  PCP: Celene Squibb, MD  Admit date: 12/26/2020 Discharge date: 12/28/2020  Admitted From: Home Disposition:  SNF  Recommendations for Outpatient Follow-up:  1. Follow up with PCP in 1-2 weeks 2. Please obtain BMP/CBC in one week     Discharge Condition: Stable CODE STATUS: DNR Diet recommendation: Heart Healthy / Carb Modified    Brief/Interim Summary: 87 y.o.femalewith medical history significant forDM2, HTN, HLD, CKD, Dementia, GERD presents after rolling out of bed.  She is a poor historian due to her dementia.  Daughter helps supplement the history.  To the patient's best knowledge, she tries to get out of bed and becomes unsteady resulting in a mechanical fall.  There is no syncope.  Last fall 4 months ago.  At baseline, patient is able to ambulate with a cane and walker, not needing assistance for transfers and only needing assistance with bathing. Daughter found patient on the floor around 9AM on day of admission.  It is unknown how long patient was on the floor, but felt that patient likely fell in middle of night.  Patient lives with her daughter Jasmine Bridges. Patient complained of neck and shoulder pain after fall.  She denies  fevers, chills, headache, chest pain, dyspnea, nausea, vomiting, diarrhea, abdominal pain.    ED Course:Heart rate 70s to 80s. Potassium 3.1. Mag- 1.9, CK- 834., Cervical CT shows fracture to the right anterior C5-C6 bridging osteophytes with 3 mm of distraction.,Also high density fluid and stranding in the soft tissues of the left neck concerning for a hemorrhagic contusion.  Pelvic and chest x-ray, maxillofacial and head CT without acute abnormality. EDPtalked to neurosurgeon on-call Dr. Elwin Mocha recommended c-collar to be in place for 6 to 12 weeks and patient may follow-up in office in 3 to 4 weeks. Also regarding hemorrhagic contusion of the left neck, as  patient does not have hematoma or compression of underlying structures on exam this is less concerning. Initial plan was to discharge patient from the ED, but with worsening neck pain, and neck collar, patient requiringmore care, and her daughter who is her caregiver is unable to provide this. She is also worried patient will continue to fall out of bed.  Discharge Diagnoses:  Nontraumatic rhabdomyolysis -CK peaked 1178>>975 -continue IVF  Cervical Spine fracture -CT c-spine-->Fracture through a right anterior C5-6 bridging osteophyte with 3 mm of distraction. High-density fluid and stranding in the soft tissues of the left neck concerning for a hemorrhagic contusion. - EDPtalked to neurosurgeon on-call Dr. Marcello Moores, recommendedc-collar to be in place for 6 to 12 weeks and patient may follow-up in office in 3 to 4 weeks. Also regarding hemorrhagic contusion of the left neck, as patient does not have hematoma or compression of underlying structures on exam this is less concerning. -PT-->SNF PT/OT evaluations have been performed.  SNF is recommended.  SNF is appropriate as the patient has received 3 days of hospital care (or the 3 day period has bee waived by the patient's insurance company) and is felt to need rehab services to restore this patient to their prior level of function to achieve safe transition back to home care.  This patient needs rehab services for at least 5 days per week and skilled nursing services daily to facilitate this transition.  Rehab is being requested as the most appropriate discharge option for this patient and  NOT felt to be for custodial care as evidenced by patient's ability to perform  ADLs with minimal to no assistance -judicious opioids  Hypokalemia -repleted -mag 1.9  Diet controlled diabetes mellitus type 2 -novolog sliding scale -CBGs controlled during hospitalization  Essential Hypertension -hold torsemide and spironolactone temporarily>>plan to  restart 6/4/232  Dementia -continue seroquel and namenda  Leg pain/edema -venous duplex--neg   Discharge Instructions   Allergies as of 12/28/2020      Reactions   Adhesive [tape]    Latex       Medication List    STOP taking these medications   ALPRAZolam 0.25 MG tablet Commonly known as: XANAX   nystatin cream Commonly known as: MYCOSTATIN     TAKE these medications   allopurinol 100 MG tablet Commonly known as: ZYLOPRIM Take 100 mg by mouth daily. Reported on 09/04/2015   D3-1000 25 MCG (1000 UT) tablet Generic drug: Cholecalciferol Take 2,000 Units by mouth daily.   gabapentin 300 MG capsule Commonly known as: NEURONTIN Take 300 mg by mouth 2 (two) times daily.   HYDROcodone-acetaminophen 5-325 MG tablet Commonly known as: NORCO/VICODIN Take 1 tablet by mouth every 6 (six) hours as needed for moderate pain.   memantine 10 MG tablet Commonly known as: NAMENDA Take 10 mg by mouth 2 (two) times daily.   potassium chloride 10 MEQ tablet Commonly known as: KLOR-CON Take 10 mEq by mouth daily.   QUEtiapine 25 MG tablet Commonly known as: SEROQUEL Take 25 mg by mouth at bedtime.   spironolactone 25 MG tablet Commonly known as: ALDACTONE Take 25 mg by mouth daily.   torsemide 20 MG tablet Commonly known as: DEMADEX Take 20 mg by mouth daily.       Contact information for after-discharge care    [facility] .   Service: Skilled Nursing Contact information: [address] [place] [address] [phone]                 Allergies  Allergen Reactions  . Adhesive [Tape]   . Latex     Consultations:  none   Procedures/Studies: DG Ribs Unilateral W/Chest Left  Result Date: 12/26/2020 CLINICAL DATA:  Status post fall. EXAM: LEFT RIBS AND CHEST - 3+ VIEW COMPARISON:  None. FINDINGS: No fracture or other bone lesions are seen involving the ribs. There is no evidence of  pneumothorax or pleural effusion. Both lungs are clear. Heart size and mediastinal contours are within normal limits. IMPRESSION: Negative. Electronically Signed   By: Kathreen Devoid   On: 12/26/2020 13:18   CT HEAD WO CONTRAST  Result Date: 12/26/2020 CLINICAL DATA:  Status post fall.  Hit head on nightstand. EXAM: CT HEAD WITHOUT CONTRAST CT CERVICAL SPINE WITHOUT CONTRAST TECHNIQUE: Multidetector CT imaging of the head and cervical spine was performed following the standard protocol without intravenous contrast. Multiplanar CT image reconstructions of the cervical spine were also generated. COMPARISON:  None. FINDINGS: Brain: No evidence of acute infarction, hemorrhage, extra-axial collection, or mass effect. Mild ventriculomegaly commensurate with the degree of cerebral atrophy. Generalized cerebral atrophy. Periventricular white matter low attenuation likely secondary to microangiopathy. Vascular: Cerebrovascular atherosclerotic calcifications are noted. No hyperdense vessels. Skull: Negative for fracture or focal lesion. Sinuses/Orbits: Visualized portions of the orbits are unremarkable. Visualized portions of the paranasal sinuses are unremarkable. Visualized portions of the mastoid air cells are unremarkable. Other: None. CT CERVICAL SPINE FINDINGS Alignment: Normal. Skull base and vertebrae: Fracture through a right anterior C5-6 bridging osteophyte with 3 mm of distraction. No primary bone lesion  or focal pathologic process. Soft tissues and spinal canal: No prevertebral fluid or swelling. No visible canal hematoma. Disc levels: Degenerative disease with disc height loss at C4-5, C5-6 and C6-7 and C7-T1. Acquired ankylosis across the C4-5 disc space. Ankylosis of the posterior elements at C3-4 C4-5. At C3-4 there is a broad-based disc osteophyte complex. Low left uncovertebral degenerative changes. Moderate right and severe left facet arthropathy. Severe left foraminal stenosis. Mild right foraminal  stenosis. At C4-5 there is interbody fusion. Moderate right and mild left facet arthropathy. Moderate right foraminal stenosis. No left foraminal stenosis. At C5-6 there is a broad-based disc osteophyte complex. Uncovertebral degenerative changes bilaterally. Severe right foraminal stenosis. Mild left foraminal stenosis. At C6-7 there is a broad-based disc osteophyte complex. Bilateral uncovertebral degenerative changes. Moderate bilateral foraminal stenosis. At C7-T1 there is a broad-based disc bulge. Bilateral uncovertebral degenerative changes. Severe bilateral foraminal stenosis. Upper chest: Lung apices are clear. Other: High-density fluid and stranding in the soft tissues of the left neck concerning for a hemorrhagic contusion. IMPRESSION: 1. No acute intracranial pathology. 2. Fracture through a right anterior C5-6 bridging osteophyte with 3 mm of distraction. 3. High-density fluid and stranding in the soft tissues of the left neck concerning for a hemorrhagic contusion. 4. Cervical spine spondylosis as described above. Critical Value/emergent results were called by telephone at the time of interpretation on 12/26/2020 at 1:58 pm to provider [facility] , who verbally acknowledged these results. Electronically Signed   By: Kathreen Devoid   On: 12/26/2020 13:59   CT CERVICAL SPINE WO CONTRAST  Result Date: 12/26/2020 CLINICAL DATA:  Status post fall.  Hit head on nightstand. EXAM: CT HEAD WITHOUT CONTRAST CT CERVICAL SPINE WITHOUT CONTRAST TECHNIQUE: Multidetector CT imaging of the head and cervical spine was performed following the standard protocol without intravenous contrast. Multiplanar CT image reconstructions of the cervical spine were also generated. COMPARISON:  None. FINDINGS: Brain: No evidence of acute infarction, hemorrhage, extra-axial collection, or mass effect. Mild ventriculomegaly commensurate with the degree of cerebral atrophy. Generalized cerebral atrophy. Periventricular white  matter low attenuation likely secondary to microangiopathy. Vascular: Cerebrovascular atherosclerotic calcifications are noted. No hyperdense vessels. Skull: Negative for fracture or focal lesion. Sinuses/Orbits: Visualized portions of the orbits are unremarkable. Visualized portions of the paranasal sinuses are unremarkable. Visualized portions of the mastoid air cells are unremarkable. Other: None. CT CERVICAL SPINE FINDINGS Alignment: Normal. Skull base and vertebrae: Fracture through a right anterior C5-6 bridging osteophyte with 3 mm of distraction. No primary bone lesion or focal pathologic process. Soft tissues and spinal canal: No prevertebral fluid or swelling. No visible canal hematoma. Disc levels: Degenerative disease with disc height loss at C4-5, C5-6 and C6-7 and C7-T1. Acquired ankylosis across the C4-5 disc space. Ankylosis of the posterior elements at C3-4 C4-5. At C3-4 there is a broad-based disc osteophyte complex. Low left uncovertebral degenerative changes. Moderate right and severe left facet arthropathy. Severe left foraminal stenosis. Mild right foraminal stenosis. At C4-5 there is interbody fusion. Moderate right and mild left facet arthropathy. Moderate right foraminal stenosis. No left foraminal stenosis. At C5-6 there is a broad-based disc osteophyte complex. Uncovertebral degenerative changes bilaterally. Severe right foraminal stenosis. Mild left foraminal stenosis. At C6-7 there is a broad-based disc osteophyte complex. Bilateral uncovertebral degenerative changes. Moderate bilateral foraminal stenosis. At C7-T1 there is a broad-based disc bulge. Bilateral uncovertebral degenerative changes. Severe bilateral foraminal stenosis. Upper chest: Lung apices are clear. Other: High-density fluid and stranding in the soft tissues of  the left neck concerning for a hemorrhagic contusion. IMPRESSION: 1. No acute intracranial pathology. 2. Fracture through a right anterior C5-6 bridging  osteophyte with 3 mm of distraction. 3. High-density fluid and stranding in the soft tissues of the left neck concerning for a hemorrhagic contusion. 4. Cervical spine spondylosis as described above. Critical Value/emergent results were called by telephone at the time of interpretation on 12/26/2020 at 1:58 pm to provider [facility] , who verbally acknowledged these results. Electronically Signed   By: Kathreen Devoid   On: 12/26/2020 13:59   US Venous Img Lower Bilateral (DVT)  Result Date: 12/28/2020 CLINICAL DATA:  Bilateral leg pain.  Rule out DVT. EXAM: BILATERAL LOWER EXTREMITY VENOUS DOPPLER ULTRASOUND TECHNIQUE: Gray-scale sonography with graded compression, as well as color Doppler and duplex ultrasound were performed to evaluate the lower extremity deep venous systems from the level of the common femoral vein and including the common femoral, femoral, profunda femoral, popliteal and calf veins including the posterior tibial, peroneal and gastrocnemius veins when visible. The superficial great saphenous vein was also interrogated. Spectral Doppler was utilized to evaluate flow at rest and with distal augmentation maneuvers in the common femoral, femoral and popliteal veins. COMPARISON:  None. FINDINGS: RIGHT LOWER EXTREMITY Common Femoral Vein: No evidence of thrombus. Normal compressibility, respiratory phasicity and response to augmentation. Saphenofemoral Junction: No evidence of thrombus. Normal compressibility and flow on color Doppler imaging. Profunda Femoral Vein: No evidence of thrombus. Normal compressibility and flow on color Doppler imaging. Femoral Vein: No evidence of thrombus. Normal compressibility, respiratory phasicity and response to augmentation. Popliteal Vein: No evidence of thrombus. Normal compressibility, respiratory phasicity and response to augmentation. Calf Veins: No evidence of thrombus. Normal compressibility and flow on color Doppler imaging. Superficial Great Saphenous  Vein: No evidence of thrombus. Normal compressibility. Venous Reflux:  None. Other Findings:  None. LEFT LOWER EXTREMITY Common Femoral Vein: No evidence of thrombus. Normal compressibility, respiratory phasicity and response to augmentation. Saphenofemoral Junction: No evidence of thrombus. Normal compressibility and flow on color Doppler imaging. Profunda Femoral Vein: No evidence of thrombus. Normal compressibility and flow on color Doppler imaging. Femoral Vein: No evidence of thrombus. Normal compressibility, respiratory phasicity and response to augmentation. Popliteal Vein: No evidence of thrombus. Normal compressibility, respiratory phasicity and response to augmentation. Calf Veins: No evidence of thrombus. Normal compressibility and flow on color Doppler imaging. Superficial Great Saphenous Vein: No evidence of thrombus. Normal compressibility. Venous Reflux:  None. Other Findings:  None. IMPRESSION: No evidence of deep venous thrombosis in either lower extremity. Electronically Signed   By: Franchot Gallo M.D.   On: 12/28/2020 10:09   DG Shoulder Left  Result Date: 12/26/2020 CLINICAL DATA:  Left shoulder pain after fall today. EXAM: LEFT SHOULDER - 2+ VIEW COMPARISON:  None. FINDINGS: There is no evidence of fracture or dislocation. There is no evidence of arthropathy or other focal bone abnormality. Soft tissues are unremarkable. IMPRESSION: Negative. Electronically Signed   By: Marijo Conception M.D.   On: 12/26/2020 15:10   DG Humerus Left  Result Date: 12/26/2020 CLINICAL DATA:  Left arm pain after fall today. EXAM: LEFT HUMERUS - 2+ VIEW COMPARISON:  None. FINDINGS: There is no evidence of fracture or other focal bone lesions. Soft tissues are unremarkable. IMPRESSION: Negative. Electronically Signed   By: Marijo Conception M.D.   On: 12/26/2020 15:11   DG Hip Unilat W or Wo Pelvis 2-3 Views Right  Result Date: 12/26/2020 CLINICAL DATA:  Fall. EXAM:  DG HIP (WITH OR WITHOUT PELVIS) 2-3V RIGHT  COMPARISON:  06/13/2003. FINDINGS: Bilateral hip replacements. Hardware intact. Anatomic alignment. Prior lumbar spine fusion. Diffuse osteopenia. Degenerative changes lumbar spine and both hips. No acute bony abnormality identified. Specifically the right hip is intact. IMPRESSION: 1. Bilateral hip replacements. Prior lumbar spine fusion. Hardware intact. 2. Diffuse osteopenia. Degenerative changes lumbar spine and both SI joints. 3. No acute bony abnormality. Specifically the right hip is intact. Electronically Signed   By: Marcello Moores  Register   On: 12/26/2020 13:18   CT Maxillofacial WO CM  Result Date: 12/26/2020 CLINICAL DATA:  Head trauma, fell off bed striking head on nightstand, laceration LEFT frontal, denies loss of consciousness, has cervical spine fracture EXAM: CT MAXILLOFACIAL WITHOUT CONTRAST TECHNIQUE: Multidetector CT imaging of the maxillofacial structures was performed. Multiplanar CT image reconstructions were also generated. Right side of face marked with BB. COMPARISON:  None FINDINGS: Osseous: Osseous demineralization. Scattered beam hardening artifacts of dental origin. Facial bones intact. No facial bone fractures identified. Orbits: Bony orbits intact. Intraorbital soft tissue planes clear. No orbital pneumatosis or fluid. Sinuses: Mild nasal septal deviation to the RIGHT. Paranasal sinuses clear. Mastoid air cells and middle ear cavities clear. Soft tissues: LEFT facial soft tissue swelling/contusion and skin thickening. Mild atherosclerotic calcifications at carotid bifurcations Limited intracranial: Unremarkable IMPRESSION: No acute facial bone abnormalities. Electronically Signed   By: Lavonia Dana M.D.   On: 12/26/2020 15:00        Discharge Exam: Vitals:   12/27/20 2215 12/28/20 0526  BP: (!) 144/60 139/77  Pulse: 71 67  Resp: 20 18  Temp: 98 F (36.7 C) 97.6 F (36.4 C)  SpO2: 99% 100%   Vitals:   12/27/20 0723 12/27/20 1332 12/27/20 2215 12/28/20 0526  BP: 140/63  127/73 (!) 144/60 139/77  Pulse: 81 74 71 67  Resp: 18 18 20 18   Temp: 97.8 F (36.6 C) 98.6 F (37 C) 98 F (36.7 C) 97.6 F (36.4 C)  TempSrc: Oral Oral Oral Oral  SpO2: 100% 97% 99% 100%  Weight:      Height:        General: Pt is alert, awake, not in acute distress Cardiovascular: RRR, S1/S2 +, no rubs, no gallops Respiratory:Bibasilar rales. No wheeze Abdominal: Soft, NT, ND, bowel sounds + Extremities: nonpitting LE edema, no cyanosis   The results of significant diagnostics from this hospitalization (including imaging, microbiology, ancillary and laboratory) are listed below for reference.    Significant Diagnostic Studies: DG Ribs Unilateral W/Chest Left  Result Date: 12/26/2020 CLINICAL DATA:  Status post fall. EXAM: LEFT RIBS AND CHEST - 3+ VIEW COMPARISON:  None. FINDINGS: No fracture or other bone lesions are seen involving the ribs. There is no evidence of pneumothorax or pleural effusion. Both lungs are clear. Heart size and mediastinal contours are within normal limits. IMPRESSION: Negative. Electronically Signed   By: Kathreen Devoid   On: 12/26/2020 13:18   CT HEAD WO CONTRAST  Result Date: 12/26/2020 CLINICAL DATA:  Status post fall.  Hit head on nightstand. EXAM: CT HEAD WITHOUT CONTRAST CT CERVICAL SPINE WITHOUT CONTRAST TECHNIQUE: Multidetector CT imaging of the head and cervical spine was performed following the standard protocol without intravenous contrast. Multiplanar CT image reconstructions of the cervical spine were also generated. COMPARISON:  None. FINDINGS: Brain: No evidence of acute infarction, hemorrhage, extra-axial collection, or mass effect. Mild ventriculomegaly commensurate with the degree of cerebral atrophy. Generalized cerebral atrophy. Periventricular white matter low attenuation likely secondary to  microangiopathy. Vascular: Cerebrovascular atherosclerotic calcifications are noted. No hyperdense vessels. Skull: Negative for fracture or focal  lesion. Sinuses/Orbits: Visualized portions of the orbits are unremarkable. Visualized portions of the paranasal sinuses are unremarkable. Visualized portions of the mastoid air cells are unremarkable. Other: None. CT CERVICAL SPINE FINDINGS Alignment: Normal. Skull base and vertebrae: Fracture through a right anterior C5-6 bridging osteophyte with 3 mm of distraction. No primary bone lesion or focal pathologic process. Soft tissues and spinal canal: No prevertebral fluid or swelling. No visible canal hematoma. Disc levels: Degenerative disease with disc height loss at C4-5, C5-6 and C6-7 and C7-T1. Acquired ankylosis across the C4-5 disc space. Ankylosis of the posterior elements at C3-4 C4-5. At C3-4 there is a broad-based disc osteophyte complex. Low left uncovertebral degenerative changes. Moderate right and severe left facet arthropathy. Severe left foraminal stenosis. Mild right foraminal stenosis. At C4-5 there is interbody fusion. Moderate right and mild left facet arthropathy. Moderate right foraminal stenosis. No left foraminal stenosis. At C5-6 there is a broad-based disc osteophyte complex. Uncovertebral degenerative changes bilaterally. Severe right foraminal stenosis. Mild left foraminal stenosis. At C6-7 there is a broad-based disc osteophyte complex. Bilateral uncovertebral degenerative changes. Moderate bilateral foraminal stenosis. At C7-T1 there is a broad-based disc bulge. Bilateral uncovertebral degenerative changes. Severe bilateral foraminal stenosis. Upper chest: Lung apices are clear. Other: High-density fluid and stranding in the soft tissues of the left neck concerning for a hemorrhagic contusion. IMPRESSION: 1. No acute intracranial pathology. 2. Fracture through a right anterior C5-6 bridging osteophyte with 3 mm of distraction. 3. High-density fluid and stranding in the soft tissues of the left neck concerning for a hemorrhagic contusion. 4. Cervical spine spondylosis as described  above. Critical Value/emergent results were called by telephone at the time of interpretation on 12/26/2020 at 1:58 pm to provider [facility] , who verbally acknowledged these results. Electronically Signed   By: Kathreen Devoid   On: 12/26/2020 13:59   CT CERVICAL SPINE WO CONTRAST  Result Date: 12/26/2020 CLINICAL DATA:  Status post fall.  Hit head on nightstand. EXAM: CT HEAD WITHOUT CONTRAST CT CERVICAL SPINE WITHOUT CONTRAST TECHNIQUE: Multidetector CT imaging of the head and cervical spine was performed following the standard protocol without intravenous contrast. Multiplanar CT image reconstructions of the cervical spine were also generated. COMPARISON:  None. FINDINGS: Brain: No evidence of acute infarction, hemorrhage, extra-axial collection, or mass effect. Mild ventriculomegaly commensurate with the degree of cerebral atrophy. Generalized cerebral atrophy. Periventricular white matter low attenuation likely secondary to microangiopathy. Vascular: Cerebrovascular atherosclerotic calcifications are noted. No hyperdense vessels. Skull: Negative for fracture or focal lesion. Sinuses/Orbits: Visualized portions of the orbits are unremarkable. Visualized portions of the paranasal sinuses are unremarkable. Visualized portions of the mastoid air cells are unremarkable. Other: None. CT CERVICAL SPINE FINDINGS Alignment: Normal. Skull base and vertebrae: Fracture through a right anterior C5-6 bridging osteophyte with 3 mm of distraction. No primary bone lesion or focal pathologic process. Soft tissues and spinal canal: No prevertebral fluid or swelling. No visible canal hematoma. Disc levels: Degenerative disease with disc height loss at C4-5, C5-6 and C6-7 and C7-T1. Acquired ankylosis across the C4-5 disc space. Ankylosis of the posterior elements at C3-4 C4-5. At C3-4 there is a broad-based disc osteophyte complex. Low left uncovertebral degenerative changes. Moderate right and severe left facet  arthropathy. Severe left foraminal stenosis. Mild right foraminal stenosis. At C4-5 there is interbody fusion. Moderate right and mild left facet arthropathy. Moderate right foraminal stenosis. No left  foraminal stenosis. At C5-6 there is a broad-based disc osteophyte complex. Uncovertebral degenerative changes bilaterally. Severe right foraminal stenosis. Mild left foraminal stenosis. At C6-7 there is a broad-based disc osteophyte complex. Bilateral uncovertebral degenerative changes. Moderate bilateral foraminal stenosis. At C7-T1 there is a broad-based disc bulge. Bilateral uncovertebral degenerative changes. Severe bilateral foraminal stenosis. Upper chest: Lung apices are clear. Other: High-density fluid and stranding in the soft tissues of the left neck concerning for a hemorrhagic contusion. IMPRESSION: 1. No acute intracranial pathology. 2. Fracture through a right anterior C5-6 bridging osteophyte with 3 mm of distraction. 3. High-density fluid and stranding in the soft tissues of the left neck concerning for a hemorrhagic contusion. 4. Cervical spine spondylosis as described above. Critical Value/emergent results were called by telephone at the time of interpretation on 12/26/2020 at 1:58 pm to provider [facility] , who verbally acknowledged these results. Electronically Signed   By: Kathreen Devoid   On: 12/26/2020 13:59   US Venous Img Lower Bilateral (DVT)  Result Date: 12/28/2020 CLINICAL DATA:  Bilateral leg pain.  Rule out DVT. EXAM: BILATERAL LOWER EXTREMITY VENOUS DOPPLER ULTRASOUND TECHNIQUE: Gray-scale sonography with graded compression, as well as color Doppler and duplex ultrasound were performed to evaluate the lower extremity deep venous systems from the level of the common femoral vein and including the common femoral, femoral, profunda femoral, popliteal and calf veins including the posterior tibial, peroneal and gastrocnemius veins when visible. The superficial great saphenous vein  was also interrogated. Spectral Doppler was utilized to evaluate flow at rest and with distal augmentation maneuvers in the common femoral, femoral and popliteal veins. COMPARISON:  None. FINDINGS: RIGHT LOWER EXTREMITY Common Femoral Vein: No evidence of thrombus. Normal compressibility, respiratory phasicity and response to augmentation. Saphenofemoral Junction: No evidence of thrombus. Normal compressibility and flow on color Doppler imaging. Profunda Femoral Vein: No evidence of thrombus. Normal compressibility and flow on color Doppler imaging. Femoral Vein: No evidence of thrombus. Normal compressibility, respiratory phasicity and response to augmentation. Popliteal Vein: No evidence of thrombus. Normal compressibility, respiratory phasicity and response to augmentation. Calf Veins: No evidence of thrombus. Normal compressibility and flow on color Doppler imaging. Superficial Great Saphenous Vein: No evidence of thrombus. Normal compressibility. Venous Reflux:  None. Other Findings:  None. LEFT LOWER EXTREMITY Common Femoral Vein: No evidence of thrombus. Normal compressibility, respiratory phasicity and response to augmentation. Saphenofemoral Junction: No evidence of thrombus. Normal compressibility and flow on color Doppler imaging. Profunda Femoral Vein: No evidence of thrombus. Normal compressibility and flow on color Doppler imaging. Femoral Vein: No evidence of thrombus. Normal compressibility, respiratory phasicity and response to augmentation. Popliteal Vein: No evidence of thrombus. Normal compressibility, respiratory phasicity and response to augmentation. Calf Veins: No evidence of thrombus. Normal compressibility and flow on color Doppler imaging. Superficial Great Saphenous Vein: No evidence of thrombus. Normal compressibility. Venous Reflux:  None. Other Findings:  None. IMPRESSION: No evidence of deep venous thrombosis in either lower extremity. Electronically Signed   By: Franchot Gallo M.D.    On: 12/28/2020 10:09   DG Shoulder Left  Result Date: 12/26/2020 CLINICAL DATA:  Left shoulder pain after fall today. EXAM: LEFT SHOULDER - 2+ VIEW COMPARISON:  None. FINDINGS: There is no evidence of fracture or dislocation. There is no evidence of arthropathy or other focal bone abnormality. Soft tissues are unremarkable. IMPRESSION: Negative. Electronically Signed   By: Marijo Conception M.D.   On: 12/26/2020 15:10   DG Humerus Left  Result Date: 12/26/2020  CLINICAL DATA:  Left arm pain after fall today. EXAM: LEFT HUMERUS - 2+ VIEW COMPARISON:  None. FINDINGS: There is no evidence of fracture or other focal bone lesions. Soft tissues are unremarkable. IMPRESSION: Negative. Electronically Signed   By: Marijo Conception M.D.   On: 12/26/2020 15:11   DG Hip Unilat W or Wo Pelvis 2-3 Views Right  Result Date: 12/26/2020 CLINICAL DATA:  Fall. EXAM: DG HIP (WITH OR WITHOUT PELVIS) 2-3V RIGHT COMPARISON:  06/13/2003. FINDINGS: Bilateral hip replacements. Hardware intact. Anatomic alignment. Prior lumbar spine fusion. Diffuse osteopenia. Degenerative changes lumbar spine and both hips. No acute bony abnormality identified. Specifically the right hip is intact. IMPRESSION: 1. Bilateral hip replacements. Prior lumbar spine fusion. Hardware intact. 2. Diffuse osteopenia. Degenerative changes lumbar spine and both SI joints. 3. No acute bony abnormality. Specifically the right hip is intact. Electronically Signed   By: Marcello Moores  Register   On: 12/26/2020 13:18   CT Maxillofacial WO CM  Result Date: 12/26/2020 CLINICAL DATA:  Head trauma, fell off bed striking head on nightstand, laceration LEFT frontal, denies loss of consciousness, has cervical spine fracture EXAM: CT MAXILLOFACIAL WITHOUT CONTRAST TECHNIQUE: Multidetector CT imaging of the maxillofacial structures was performed. Multiplanar CT image reconstructions were also generated. Right side of face marked with BB. COMPARISON:  None FINDINGS: Osseous: Osseous  demineralization. Scattered beam hardening artifacts of dental origin. Facial bones intact. No facial bone fractures identified. Orbits: Bony orbits intact. Intraorbital soft tissue planes clear. No orbital pneumatosis or fluid. Sinuses: Mild nasal septal deviation to the RIGHT. Paranasal sinuses clear. Mastoid air cells and middle ear cavities clear. Soft tissues: LEFT facial soft tissue swelling/contusion and skin thickening. Mild atherosclerotic calcifications at carotid bifurcations Limited intracranial: Unremarkable IMPRESSION: No acute facial bone abnormalities. Electronically Signed   By: Lavonia Dana M.D.   On: 12/26/2020 15:00     Microbiology: Recent Results (from the past 240 hour(s))  Resp Panel by RT-PCR (Flu A&B, Covid) Nasopharyngeal Swab     Status: None   Collection Time: 12/26/20 12:23 PM   Specimen: Nasopharyngeal Swab; Nasopharyngeal(NP) swabs in vial transport medium  Result Value Ref Range Status   SARS Coronavirus 2 by RT PCR NEGATIVE NEGATIVE Final    Comment: (NOTE) SARS-CoV-2 target nucleic acids are NOT DETECTED.  The SARS-CoV-2 RNA is generally detectable in upper respiratory specimens during the acute phase of infection. The lowest concentration of SARS-CoV-2 viral copies this assay can detect is 138 copies/mL. A negative result does not preclude SARS-Cov-2 infection and should not be used as the sole basis for treatment or other patient management decisions. A negative result may occur with  improper specimen collection/handling, submission of specimen other than nasopharyngeal swab, presence of viral mutation(s) within the areas targeted by this assay, and inadequate number of viral copies(<138 copies/mL). A negative result must be combined with clinical observations, patient history, and epidemiological information. The expected result is Negative.  Fact Sheet for Patients:  EntrepreneurPulse.com.au  Fact Sheet for Healthcare Providers:   IncredibleEmployment.be  This test is no t yet approved or cleared by the [place] FDA and  has been authorized for detection and/or diagnosis of SARS-CoV-2 by FDA under an Emergency Use Authorization (EUA). This EUA will remain  in effect (meaning this test can be used) for the duration of the COVID-19 declaration under Section 564(b)(1) of the Act, 21 U.S.C.section 360bbb-3(b)(1), unless the authorization is terminated  or revoked sooner.       Influenza A by PCR  NEGATIVE NEGATIVE Final   Influenza B by PCR NEGATIVE NEGATIVE Final    Comment: (NOTE) The Xpert Xpress SARS-CoV-2/FLU/RSV plus assay is intended as an aid in the diagnosis of influenza from Nasopharyngeal swab specimens and should not be used as a sole basis for treatment. Nasal washings and aspirates are unacceptable for Xpert Xpress SARS-CoV-2/FLU/RSV testing.  Fact Sheet for Patients: EntrepreneurPulse.com.au  Fact Sheet for Healthcare Providers: IncredibleEmployment.be  This test is not yet approved or cleared by the [place] FDA and has been authorized for detection and/or diagnosis of SARS-CoV-2 by FDA under an Emergency Use Authorization (EUA). This EUA will remain in effect (meaning this test can be used) for the duration of the COVID-19 declaration under Section 564(b)(1) of the Act, 21 U.S.C. section 360bbb-3(b)(1), unless the authorization is terminated or revoked.  Performed at [facility], [address], [place], [place] [address]      Labs: Basic Metabolic Panel: Recent Labs  Lab 12/26/20 1314 12/28/20 0631  NA 137 136  K 3.1* 4.1  CL 105 104  CO2 23 26  GLUCOSE 156* 92  BUN 19 21  CREATININE 1.17* 1.22*  CALCIUM 9.0 9.0  MG 1.9  --    Liver Function Tests: Recent Labs  Lab 12/26/20 1314  AST 28  ALT 12  ALKPHOS 57  BILITOT 0.7  PROT 6.9  ALBUMIN 3.6   No results for input(s): LIPASE, AMYLASE in the last 168  hours. No results for input(s): AMMONIA in the last 168 hours. CBC: Recent Labs  Lab 12/26/20 1314 12/27/20 0710  WBC 9.4 8.1  NEUTROABS 7.5  --   HGB 12.5 11.6*  HCT 36.9 35.0*  MCV 90.7 91.6  PLT 181 163   Cardiac Enzymes: Recent Labs  Lab 12/26/20 1314 12/26/20 1741 12/28/20 0631  CKTOTAL 834* 1,178* 975*   BNP: Invalid input(s): POCBNP CBG: Recent Labs  Lab 12/26/20 2313 12/27/20 1116 12/27/20 1634 12/28/20 0753  GLUCAP 99 110* 78 75    Time coordinating discharge:  36 minutes  Signed:  Orson Eva, DO [facility] Pager: [phone] 12/28/2020, 2:00 PM

## 2020-12-28 NOTE — TOC Transition Note (Signed)
Transition of Care [facility]) - CM/SW Discharge Note   Patient Details  Name: Jasmine Bridges MRN: [id number] Date of Birth: 16-Nov-1932  [facility] CM/SW Contact:  Natasha Bence, LCSW Phone Number: 12/28/2020, 2:25 PM   Clinical Narrative:    CSW notified of patient's readiness for discharge. Kerri with [facility] reported that they have received auth. CSW faxed discharge summary. Nurse to call report. TOC signing off.    Final next level of care: Skilled Nursing Facility Barriers to Discharge: Barriers Resolved   Patient Goals and CMS Choice Patient states their goals for this hospitalization and ongoing recovery are:: Adams,[name] (Daughter)   [phone] CMS Medicare.gov Compare Post Acute Care list provided to:: Patient Choice offered to / list presented to : Patient  Discharge Placement              Patient chooses bed at: [facility] Patient to be transferred to facility by: [facility] Name of family member notified: Adams,[name] Patient and family notified of of transfer: 12/28/20  Discharge Plan and Services     Post Acute Care Choice: [facility]                               Social Determinants of Health (SDOH) Interventions     Readmission Risk Interventions No flowsheet data found.

## 2020-12-31 ENCOUNTER — Encounter: Payer: Self-pay | Admitting: Adult Health

## 2020-12-31 ENCOUNTER — Non-Acute Institutional Stay (SKILLED_NURSING_FACILITY): Payer: Medicare HMO | Admitting: Adult Health

## 2020-12-31 ENCOUNTER — Non-Acute Institutional Stay: Payer: Self-pay | Admitting: Adult Health

## 2020-12-31 DIAGNOSIS — E1142 Type 2 diabetes mellitus with diabetic polyneuropathy: Secondary | ICD-10-CM

## 2020-12-31 DIAGNOSIS — M1A30X0 Chronic gout due to renal impairment, unspecified site, without tophus (tophi): Secondary | ICD-10-CM

## 2020-12-31 DIAGNOSIS — F0150 Vascular dementia without behavioral disturbance: Secondary | ICD-10-CM

## 2020-12-31 DIAGNOSIS — E1122 Type 2 diabetes mellitus with diabetic chronic kidney disease: Secondary | ICD-10-CM | POA: Diagnosis not present

## 2020-12-31 DIAGNOSIS — T796XXS Traumatic ischemia of muscle, sequela: Secondary | ICD-10-CM

## 2020-12-31 DIAGNOSIS — R600 Localized edema: Secondary | ICD-10-CM

## 2020-12-31 DIAGNOSIS — E876 Hypokalemia: Secondary | ICD-10-CM | POA: Diagnosis not present

## 2020-12-31 DIAGNOSIS — M2578 Osteophyte, vertebrae: Secondary | ICD-10-CM

## 2020-12-31 DIAGNOSIS — M1A9XX0 Chronic gout, unspecified, without tophus (tophi): Secondary | ICD-10-CM | POA: Insufficient documentation

## 2020-12-31 DIAGNOSIS — N1832 Chronic kidney disease, stage 3b: Secondary | ICD-10-CM

## 2020-12-31 DIAGNOSIS — F0390 Unspecified dementia without behavioral disturbance: Secondary | ICD-10-CM | POA: Diagnosis not present

## 2020-12-31 DIAGNOSIS — N183 Chronic kidney disease, stage 3 unspecified: Secondary | ICD-10-CM

## 2020-12-31 DIAGNOSIS — I129 Hypertensive chronic kidney disease with stage 1 through stage 4 chronic kidney disease, or unspecified chronic kidney disease: Secondary | ICD-10-CM

## 2020-12-31 NOTE — Progress Notes (Signed)
Location:  [facility] Room Number: 151-P Place of Service:  SNF (31)   CODE STATUS: Full  Allergies  Allergen Reactions  . Adhesive [Tape]   . Latex     Chief Complaint  Patient presents with  . Medical Management of Chronic Issues  . Hospitalization Follow-up    Hospital follow-up    HPI:  She is a 87 year old woman who has been hospitalized from 12-26-20 through 12-28-20. Her medical includes: diabetes; dementia; hypertension; psychosis. She rolled out of the bed. There was no syncope. Her previous fall was 4 months ago. She is normally able to ambulate with cane or walker. She lives with her daughter.  She was found in the morning; unknown how long she had lay on the floor. She had neck and shoulder pain after the fall. Her cervical CT revealed bridging osteophytes with 3 mm distraction.; left neck concerning for hemorrhage contusion. Neurosurgery was notified and recommended a c-collar placed for 6-12 weeks. The goal of her care is to return home with family. There are no reports of uncontrolled pain; no anxiety or agitation she continues to be followed for her chronic illnesses including:   CKD stage 3 due to type 2 diabetes mellitus/traumatic rhabdomyolysis:  Type 2 diabetes mellitus with stage 3 chronic kidney disease without long term current use of insulin: Hypertension associated with stage 3 chronic kidney disease due to type 2 diabetes mellitus:  Bilateral lower extremity edema:    Past Medical History:  Diagnosis Date  . Arthritis   . Diabetes mellitus type II   . GERD (gastroesophageal reflux disease)   . Hyperlipidemia   . Hypertension   . Mitral insufficiency     Past Surgical History:  Procedure Laterality Date  . ABDOMINAL HYSTERECTOMY     partial   . BACK SURGERY  1979, 1987, 2000  . FRACTURE SURGERY     hip (bilateral)  . JOINT REPLACEMENT    . PARTIAL HYSTERECTOMY    . SPINE SURGERY    . TOTAL HIP ARTHROPLASTY      Social  History   Socioeconomic History  . Marital status: Widowed    Spouse name: Not on file  . Number of children: 2  . Years of education: 8  . Highest education level: 8th grade  Occupational History  . Occupation: Retired  Tobacco Use  . Smoking status: Former Smoker    Packs/day: 1.50    Types: Cigarettes  . Smokeless tobacco: Never Used  Vaping Use  . Vaping Use: Never used  Substance and Sexual Activity  . Alcohol use: No  . Drug use: No  . Sexual activity: Not Currently    Birth control/protection: Post-menopausal  Other Topics Concern  . Not on file  Social History Narrative   Right handed    Lives with family    Social Determinants of Health   Financial Resource Strain: Low Risk   . Difficulty of Paying Living Expenses: Not hard at all  Food Insecurity: No Food Insecurity  . Worried About Charity fundraiser in the Last Year: Never true  . Ran Out of Food in the Last Year: Never true  Transportation Needs: No Transportation Needs  . Lack of Transportation (Medical): No  . Lack of Transportation (Non-Medical): No  Physical Activity: Inactive  . Days of Exercise per Week: 0 days  . Minutes of Exercise per Session: 0 min  Stress: No Stress Concern Present  . Feeling of Stress :  Not at all  Social Connections: Moderately Isolated  . Frequency of Communication with Friends and Family: More than three times a week  . Frequency of Social Gatherings with Friends and Family: More than three times a week  . Attends Religious Services: More than 4 times per year  . Active Member of Clubs or Organizations: No  . Attends Archivist Meetings: Never  . Marital Status: Widowed  Intimate Partner Violence: Not At Risk  . Fear of Current or Ex-Partner: No  . Emotionally Abused: No  . Physically Abused: No  . Sexually Abused: No   Family History  Problem Relation Age of Onset  . Heart attack Father 54  . Heart disease Father        before age 8  . Mitral valve  prolapse Daughter   . Stroke Mother   . ALS Brother   . Heart attack Son   . Heart disease Son        before age 52  . Coronary artery disease Other       VITAL SIGNS BP (!) 146/69   Pulse 76   Temp 97.9 F (36.6 C)   Resp 20   SpO2 97%   Outpatient Encounter Medications as of 12/31/2020  Medication Sig  . allopurinol (ZYLOPRIM) 100 MG tablet Take 100 mg by mouth daily. Reported on 09/04/2015  . Cholecalciferol (D3-1000) 25 MCG (1000 UT) tablet Take 2,000 Units by mouth daily.  Marland Kitchen gabapentin (NEURONTIN) 300 MG capsule Take 300 mg by mouth 2 (two) times daily.  Marland Kitchen HYDROcodone-acetaminophen (NORCO/VICODIN) 5-325 MG tablet Take 1 tablet by mouth every 6 (six) hours as needed for moderate pain.  . memantine (NAMENDA) 10 MG tablet Take 10 mg by mouth 2 (two) times daily.  . NON FORMULARY Diet: _____ Regular,   ___x___ NAS, ____x___Consistent Carbohydrate,  _______NPO _____Other  . potassium chloride (KLOR-CON) 10 MEQ tablet Take 10 mEq by mouth daily.  . QUEtiapine (SEROQUEL) 25 MG tablet Take 25 mg by mouth at bedtime.  Marland Kitchen spironolactone (ALDACTONE) 25 MG tablet Take 25 mg by mouth daily.  Marland Kitchen torsemide (DEMADEX) 20 MG tablet Take 20 mg by mouth daily.   No facility-administered encounter medications on file as of 12/31/2020.     SIGNIFICANT DIAGNOSTIC EXAMS  TODAY  12-26-20; ct of head and cervical spine:  1. No acute intracranial pathology. 2. Fracture through a right anterior C5-6 bridging osteophyte with 3 mm of distraction. 3. High-density fluid and stranding in the soft tissues of the left neck concerning for a hemorrhagic contusion. 4. Cervical spine spondylosis as described above  12-28-20: bilateral lower extremity venous doppler: No evidence of deep venous thrombosis in either lower extremity.   LABS REVIEWED TODAY;   12-26-20: wbc 9.4; hgb 12.5; hct 36.9; mcv 90.7 plt 181; glucose 156; bun 19; creat 1.17; k+ 3.1; na++ 137 ca 9.0; GFR 46; liver normal albumin 3.6; CK  834 12-27-20: wbc 8.1; hgb 11.6; hct 3.5; hct 35; mcv 91.6 plt 163 12-28-20: glucose 92; bun 21; creat 1.22; k+ 4.1; na++ 136; ca 9.0; GFR 43  Review of Systems  Constitutional: Negative for malaise/fatigue.  Respiratory: Negative for cough and shortness of breath.   Cardiovascular: Negative for chest pain, palpitations and leg swelling.  Gastrointestinal: Negative for abdominal pain, constipation and heartburn.  Musculoskeletal: Negative for back pain, joint pain and myalgias.  Skin: Negative.   Neurological: Negative for dizziness.  Psychiatric/Behavioral: The patient is not nervous/anxious.    Physical Exam Constitutional:  General: She is not in acute distress.    Appearance: She is not diaphoretic.  Eyes:     Conjunctiva/sclera: Conjunctivae normal.  Neck:     Thyroid: No thyromegaly.     Vascular: No JVD.  Cardiovascular:     Rate and Rhythm: Normal rate and regular rhythm.     Pulses: Normal pulses.  Pulmonary:     Effort: Pulmonary effort is normal. No respiratory distress.     Breath sounds: Normal breath sounds. No wheezing.  Abdominal:     General: Bowel sounds are normal. There is no distension.     Palpations: Abdomen is soft.     Tenderness: There is no abdominal tenderness.  Musculoskeletal:        General: Normal range of motion.     Cervical back: Neck supple.     Right lower leg: Edema present.     Left lower leg: Edema present.     Comments: Trace to 1+ bilateral lower extremity edema C-collar in place   Lymphadenopathy:     Cervical: No cervical adenopathy.  Skin:    General: Skin is warm and dry.  Neurological:     Mental Status: She is alert. Mental status is at baseline.  Psychiatric:        Mood and Affect: Mood normal.       ASSESSMENT/ PLAN:  TODAY  1. Cervical osteophyte fracture: is stable has c-collar in place. Will continue vicodin 5/325 mg every 6 hours; will continue therapy as directed will monitor   2. CKD stage 3 due to  type 2 diabetes mellitus/traumatic rhabdomyolysis: is stable bun 21; creat 1.22; GFR 43  3.  Type 2 diabetes mellitus with stage 3 chronic kidney disease without long term current use of insulin: will continue to monitor her status.   4. Hypertension associated with stage 3 chronic kidney disease due to type 2 diabetes mellitus: is stable b/p 146/69 will continue aldactone 25 mg daily   5. Bilateral lower extremity edema: is stable will continue demadex 20 mg daily   6. Chronic gout due to renal impairment without tophi unspecified site: is stable no recent outbreak will continue allopurinol 100 mg daily   7. Psychosis in elderly without behavioral disturbance: is stable will continue serouqel 25 mg nightly   8. Vascular dementia without behavioral disturbance: is stable will continue namenda 10 mg twice daily   9. Hypokalemia: is stable k+ is 4.1; will continue k+ 10 meq daily   10. Diabetic peripheral neuropathy due to diabetes mellitus type 2: is stable will continue gabapentin 300 mg twice daily   Time spent with patient 45 minutes: medication reconciliation; goals of care; therapy needs.   Ok Edwards NP [facility]  Contact [phone] Monday through Friday 8am- 5pm  After hours call [phone]

## 2020-12-31 NOTE — Progress Notes (Signed)
Location:  [place] Room Number: 151-P Place of Service:  SNF (31)   CODE STATUS: Full  Allergies  Allergen Reactions  . Adhesive [Tape]   . Latex     Chief Complaint  Patient presents with  . Hospitalization Follow-up    Hospital follow-up    HPI:  She is a 87 year old woman who has been hospitalized from 12-26-20 through 12-28-20. Her medical history includes: diabetes; hypertension; dementia. Prior to arriving in the ED she had a fall by rolling out of bed. She had been on the floor for an unknown period of time; was found in the AM by her daughter. She does have a C5-C6 fracture; saw Dr. Marcello Moores who recommended c-collar for 6-12 weeks. She did have rhabdomyolysis; trending from 1100's to 975. She is here for short term rehab. It is uncertain if this is to be a long term placement or not. There are no reports of pain; no reports of changes in appetite; no reports of insomnia. She will continue to be followed for her chronic illnesses including: Psychosis in elderly without behavioral disturbance: Marland Kitchen Vascular dementia without behavioral disturbance:  CKD stage 3 due to type 2 diabetes mellitus:     Diabetic peripheral neuropathy due to type 2 diabetes mellitus:  Past Medical History:  Diagnosis Date  . Arthritis   . Diabetes mellitus type II   . GERD (gastroesophageal reflux disease)   . Hyperlipidemia   . Hypertension   . Mitral insufficiency     Past Surgical History:  Procedure Laterality Date  . ABDOMINAL HYSTERECTOMY     partial   . BACK SURGERY  1979, 1987, 2000  . FRACTURE SURGERY     hip (bilateral)  . JOINT REPLACEMENT    . PARTIAL HYSTERECTOMY    . SPINE SURGERY    . TOTAL HIP ARTHROPLASTY      Social History   Socioeconomic History  . Marital status: Widowed    Spouse name: Not on file  . Number of children: 2  . Years of education: 8  . Highest education level: 8th grade  Occupational History  . Occupation: Retired  Tobacco Use   . Smoking status: Former Smoker    Packs/day: 1.50    Types: Cigarettes  . Smokeless tobacco: Never Used  Vaping Use  . Vaping Use: Never used  Substance and Sexual Activity  . Alcohol use: No  . Drug use: No  . Sexual activity: Not Currently    Birth control/protection: Post-menopausal  Other Topics Concern  . Not on file  Social History Narrative   Right handed    Lives with family    Social Determinants of Health   Financial Resource Strain: Low Risk   . Difficulty of Paying Living Expenses: Not hard at all  Food Insecurity: No Food Insecurity  . Worried About Charity fundraiser in the Last Year: Never true  . Ran Out of Food in the Last Year: Never true  Transportation Needs: No Transportation Needs  . Lack of Transportation (Medical): No  . Lack of Transportation (Non-Medical): No  Physical Activity: Inactive  . Days of Exercise per Week: 0 days  . Minutes of Exercise per Session: 0 min  Stress: No Stress Concern Present  . Feeling of Stress : Not at all  Social Connections: Moderately Isolated  . Frequency of Communication with Friends and Family: More than three times a week  . Frequency of Social Gatherings with Friends and  Family: More than three times a week  . Attends Religious Services: More than 4 times per year  . Active Member of Clubs or Organizations: No  . Attends Archivist Meetings: Never  . Marital Status: Widowed  Intimate Partner Violence: Not At Risk  . Fear of Current or Ex-Partner: No  . Emotionally Abused: No  . Physically Abused: No  . Sexually Abused: No   Family History  Problem Relation Age of Onset  . Heart attack Father 25  . Heart disease Father        before age 88  . Mitral valve prolapse Daughter   . Stroke Mother   . ALS Brother   . Heart attack Son   . Heart disease Son        before age 58  . Coronary artery disease Other       VITAL SIGNS BP (!) 146/69   Pulse 76   Temp 97.9 F (36.6 C)   SpO2 97%    Outpatient Encounter Medications as of 12/31/2020  Medication Sig  . allopurinol (ZYLOPRIM) 100 MG tablet Take 100 mg by mouth daily. Reported on 09/04/2015  . Cholecalciferol (D3-1000) 25 MCG (1000 UT) tablet Take 2,000 Units by mouth daily.  Marland Kitchen gabapentin (NEURONTIN) 300 MG capsule Take 300 mg by mouth 2 (two) times daily.  Marland Kitchen HYDROcodone-acetaminophen (NORCO/VICODIN) 5-325 MG tablet Take 1 tablet by mouth every 6 (six) hours as needed for moderate pain.  . memantine (NAMENDA) 10 MG tablet Take 10 mg by mouth 2 (two) times daily.  . NON FORMULARY Diet: _____ Regular,   ___x___ NAS, ____x___Consistent Carbohydrate,  _______NPO _____Other  . potassium chloride (KLOR-CON) 10 MEQ tablet Take 10 mEq by mouth daily.  . QUEtiapine (SEROQUEL) 25 MG tablet Take 25 mg by mouth at bedtime.  Marland Kitchen spironolactone (ALDACTONE) 25 MG tablet Take 25 mg by mouth daily.  Marland Kitchen torsemide (DEMADEX) 20 MG tablet Take 20 mg by mouth daily.   No facility-administered encounter medications on file as of 12/31/2020.     SIGNIFICANT DIAGNOSTIC EXAMS  TODAY  12-26-20: ct of head and cervical spine:  1. No acute intracranial pathology. 2. Fracture through a right anterior C5-6 bridging osteophyte with 3 mm of distraction. 3. High-density fluid and stranding in the soft tissues of the left neck concerning for a hemorrhagic contusion. 4. Cervical spine spondylosis as described above.  12-28-20: bilateral lower extremity venous doppler No evidence of deep venous thrombosis in either lower extremity   LABS REVIEWED:   12-26-20: wbc 9.4; hgb 12.5; hct 36.9; mcv 90.7 plt 181; glucose 156; bun 19; creat 1.17; k+ 3.1; na++ 137; ca 9.0 GFR 46; liver normal albumin 3.6 mag 1.9; CK 1178 12-28-20: glucose 92; bun 21; creat 1.22; k+ 4.1; na++ 136; ca 9.0 GFR 43 CK 975    Review of Systems  Constitutional:  Negative for malaise/fatigue.  Respiratory:  Negative for cough and shortness of breath.   Cardiovascular:  Negative for chest  pain, palpitations and leg swelling.  Gastrointestinal:  Negative for abdominal pain, constipation and heartburn.  Musculoskeletal:  Negative for back pain, joint pain and myalgias.  Skin: Negative.   Neurological:  Negative for dizziness.  Psychiatric/Behavioral:  The patient is not nervous/anxious.     Physical Exam Constitutional:      General: She is not in acute distress.    Appearance: She is well-developed. She is not diaphoretic.  Neck:     Thyroid: No thyromegaly.  Cardiovascular:  Rate and Rhythm: Normal rate and regular rhythm.     Pulses: Normal pulses.     Heart sounds: Normal heart sounds.  Pulmonary:     Effort: Pulmonary effort is normal. No respiratory distress.     Breath sounds: Normal breath sounds.  Abdominal:     General: Bowel sounds are normal. There is no distension.     Palpations: Abdomen is soft.     Tenderness: There is no abdominal tenderness.  Musculoskeletal:        General: Normal range of motion.     Cervical back: Neck supple.     Right lower leg: No edema.     Left lower leg: No edema.     Comments: C-collar in place   Lymphadenopathy:     Cervical: No cervical adenopathy.  Skin:    General: Skin is warm and dry.  Neurological:     Mental Status: She is alert. Mental status is at baseline.  Psychiatric:        Mood and Affect: Mood normal.     ASSESSMENT/ PLAN:  TODAY  Cervical osteophyte fracture: C5-C6; has cervical collar in place for 6-12 weeks. Will continue with therapy as directed; will continue vidocin 5/325 mg every 6 hours as needed for pain   2. Psychosis in elderly without behavioral disturbance: is stable will continue seroquel 25 mg daily   3. Vascular dementia without behavioral disturbance: is without change will continue namenda 10 mg twice daily   4. CKD stage 3 due to type 2 diabetes mellitus: is stable bun 21; creat 1.22; will monitor   5. Diabetic peripheral neuropathy due to type 2 diabetes mellitus: is  stable will continue gabapentin 300 mg twice daily   6. Hypertension associated with stage 3b chronic kidney disease due to type 2 diabetes mellitus: is stable b/p 146/69 will continue aldactone 25 mg daily   7. Bilateral lower extremity edema: is stable will continue demadex 20 mg daily   8. Chronic gout due to renal impairment unspecified site without tophus: is stable will continue allopurinol 100 mg daily   9. Hypokalemia: is stable k+ 4.1 will continue k+ 10 meq daily   10. Type 2 diabetes mellitus with stage 3b chronic kidney disease and hypertension: is stable is diet controlled; will monitor   Time spent with patient: 45 minutes; medication reconciliation; goals of care and therapy needs.    Ok Edwards NP [facility]  Contact [phone] Monday through Friday 8am- 5pm  After hours call [phone]

## 2021-01-04 DIAGNOSIS — N189 Chronic kidney disease, unspecified: Secondary | ICD-10-CM | POA: Diagnosis not present

## 2021-01-04 DIAGNOSIS — D638 Anemia in other chronic diseases classified elsewhere: Secondary | ICD-10-CM | POA: Diagnosis not present

## 2021-01-04 DIAGNOSIS — R600 Localized edema: Secondary | ICD-10-CM | POA: Diagnosis not present

## 2021-01-04 DIAGNOSIS — I129 Hypertensive chronic kidney disease with stage 1 through stage 4 chronic kidney disease, or unspecified chronic kidney disease: Secondary | ICD-10-CM | POA: Diagnosis not present

## 2021-01-04 DIAGNOSIS — R32 Unspecified urinary incontinence: Secondary | ICD-10-CM | POA: Diagnosis not present

## 2021-01-04 DIAGNOSIS — E876 Hypokalemia: Secondary | ICD-10-CM | POA: Diagnosis not present

## 2021-01-04 DIAGNOSIS — E1122 Type 2 diabetes mellitus with diabetic chronic kidney disease: Secondary | ICD-10-CM | POA: Diagnosis not present

## 2021-01-04 DIAGNOSIS — N170 Acute kidney failure with tubular necrosis: Secondary | ICD-10-CM | POA: Diagnosis not present

## 2021-01-04 DIAGNOSIS — Z79899 Other long term (current) drug therapy: Secondary | ICD-10-CM | POA: Diagnosis not present

## 2021-01-07 ENCOUNTER — Encounter: Payer: Self-pay | Admitting: Internal Medicine

## 2021-01-07 ENCOUNTER — Non-Acute Institutional Stay (SKILLED_NURSING_FACILITY): Payer: Medicare HMO | Admitting: Internal Medicine

## 2021-01-07 DIAGNOSIS — T796XXS Traumatic ischemia of muscle, sequela: Secondary | ICD-10-CM

## 2021-01-07 DIAGNOSIS — F0150 Vascular dementia without behavioral disturbance: Secondary | ICD-10-CM | POA: Diagnosis not present

## 2021-01-07 DIAGNOSIS — W19XXXD Unspecified fall, subsequent encounter: Secondary | ICD-10-CM

## 2021-01-07 DIAGNOSIS — W06XXXD Fall from bed, subsequent encounter: Secondary | ICD-10-CM

## 2021-01-07 DIAGNOSIS — M2578 Osteophyte, vertebrae: Secondary | ICD-10-CM

## 2021-01-07 NOTE — Progress Notes (Signed)
NURSING HOME LOCATION:  [facility] ROOM NUMBER:  151 P  CODE STATUS:  DNR  PCP:  Celene Squibb MD  This is a comprehensive admission note to this SNFperformed on this date less than 30 days from date of admission. Included are preadmission medical/surgical history; reconciled medication list; family history; social history and comprehensive review of systems.  Corrections and additions to the records were documented. Comprehensive physical exam was also performed. Additionally a clinical summary was entered for each active diagnosis pertinent to this admission in the Problem List to enhance continuity of care.  HPI: Patient was hospitalized 6/1 - 12/28/2020 presenting from home after falling out of bed & striking head on nightstand.  She was unsteady resulting in the fall but denied any cardiac or neurologic prodrome.  She was on the floor for several hours.  Unfortunately she has baseline dementia impacting the veracity of her story.  She apparently has fallen repeatedly, most recently February of this year according to her daughter.  Apparently at baseline she does ambulate with a cane and walker. She was complaining of neck and shoulder pain and also lower extremity pain with edema.  Venous Doppler was negative for DVT. CT of the head without contrast revealed microangiopathy with white matter changes; mild ventriculomegaly & cerebral atrophy.  C5- C6 bridging osteophyte fracture w 3 mm distraction on cervical spine films. Dr. Marcello Moores, NS was consulted via phone.  He recommended a cervical collar for 6-12 weeks with NS follow-up 3-4 weeks subsequently.  In the ED potassium was 3.1; torsemide and spironolactone were held.  Creatinine was 1.17 and GFR 46..  CK was 834.  It subsequently peaked at 1178.  This was treated with IV fluids.   Because of the persistence & progression of the neck pain she was admitted.  Past medical and surgical history: Includes diet-controlled  diabetes with vascular complications, essential hypertension, GERD, mitral insufficiency.  Surgeries and procedures include partial hysterectomy, back surgery, and bilateral total hip arthroplasties.  Social history: Former smoker; nondrinker.  Family history: non contributory due to age.   Review of systems: Dementia invalidated the history.  She actually denies any pain including cervical spine pain.  When asked why she had been in the hospital her response was "to treat a lot of things. try to walk".  Her main complaint was "bladder out of control". She could not provide the date ,even the year.  She could not name the [place].  She did know that her daughter's birthday was [birth year] 4 but could not tell me the year. Her daughter states that the bladder incontinence is a chronic problem. Her daughter believes that her kidney function is 30% of normal.  She saw Dr. Theador Hawthorne recently and apparently is to have an ultrasound of the kidneys.  Physical exam:  Pertinent or positive findings: She appears frail and somewhat chronically ill.  Her voice is markedly weak.  She is wearing a cervical collar.  Breath sounds are decreased.  Heart sounds are distant; she appears to have somewhat of a gallop cadence.  There is trace edema at the sock line.  Pedal pulses are decreased.  There is a flexion contracture of the right index finger.  General appearance: no acute distress, increased work of breathing is present.   Lymphatic: No lymphadenopathy about the head, neck, axilla. Eyes: No conjunctival inflammation or lid edema is present. There is no scleral icterus. Ears:  External ear exam shows no significant lesions or deformities.  Nose:  External nasal examination shows no deformity or inflammation. Nasal mucosa are pink and moist without lesions, exudates Oral exam: Lips and gums are healthy appearing.There is no oropharyngeal erythema or exudate. Neck:  No thyromegaly, masses, tenderness noted.    Heart:   Normal rate and regular rhythm. S1 and S2 normal without murmur, click, rub.  Lungs: without wheezes, rhonchi, rales, rubs. Abdomen: Bowel sounds are normal.  Abdomen is soft and nontender with no organomegaly, hernias, masses. GU: Deferred  Extremities:  No cyanosis, clubbing Neurologic exam: Balance, Rhomberg, finger to nose testing could not be completed due to clinical state Skin: Warm & dry w/o tenting. No significant lesions or rash.  See clinical summary under each active problem in the Problem List with associated updated therapeutic plan

## 2021-01-08 ENCOUNTER — Encounter: Payer: Self-pay | Admitting: Internal Medicine

## 2021-01-08 NOTE — Assessment & Plan Note (Signed)
PT/OT @ SNF as tolerated. 

## 2021-01-08 NOTE — Assessment & Plan Note (Signed)
No musculoskeletal complaints voiced @ this time; but this is in context of dementia.

## 2021-01-08 NOTE — Patient Instructions (Signed)
See assessment and plan under each diagnosis in the problem list and acutely for this visit 

## 2021-01-08 NOTE — Assessment & Plan Note (Signed)
Cervical collar X 6 weeks.

## 2021-01-08 NOTE — Assessment & Plan Note (Signed)
She can provide no meaningful history. She is @ baseline as per her daughter.

## 2021-01-17 ENCOUNTER — Ambulatory Visit (HOSPITAL_COMMUNITY)
Admit: 2021-01-17 | Discharge: 2021-01-17 | Disposition: A | Discharge status: Home / Self Care | Payer: Medicare HMO | Attending: Nephrology | Admitting: Nephrology

## 2021-01-21 ENCOUNTER — Ambulatory Visit (HOSPITAL_COMMUNITY): Payer: Medicare HMO | Attending: Nephrology

## 2021-01-22 ENCOUNTER — Ambulatory Visit (HOSPITAL_COMMUNITY)
Admission: RE | Admit: 2021-01-22 | Discharge: 2021-01-22 | Disposition: A | Discharge status: Home / Self Care | Payer: Medicare HMO | Source: Ambulatory Visit | Attending: Adult Health | Admitting: Adult Health

## 2021-01-22 DIAGNOSIS — N189 Chronic kidney disease, unspecified: Secondary | ICD-10-CM | POA: Insufficient documentation

## 2021-01-22 DIAGNOSIS — N281 Cyst of kidney, acquired: Secondary | ICD-10-CM | POA: Diagnosis not present

## 2021-01-23 DIAGNOSIS — N1832 Chronic kidney disease, stage 3b: Secondary | ICD-10-CM | POA: Insufficient documentation

## 2021-01-24 ENCOUNTER — Other Ambulatory Visit (HOSPITAL_COMMUNITY)
Admission: RE | Admit: 2021-01-24 | Discharge: 2021-01-24 | Disposition: A | Discharge status: Home / Self Care | Payer: Medicare HMO | Source: Skilled Nursing Facility | Attending: Adult Health | Admitting: Adult Health

## 2021-01-24 DIAGNOSIS — I10 Essential (primary) hypertension: Secondary | ICD-10-CM | POA: Diagnosis not present

## 2021-01-24 DIAGNOSIS — I129 Hypertensive chronic kidney disease with stage 1 through stage 4 chronic kidney disease, or unspecified chronic kidney disease: Secondary | ICD-10-CM | POA: Diagnosis not present

## 2021-01-24 DIAGNOSIS — N189 Chronic kidney disease, unspecified: Secondary | ICD-10-CM | POA: Insufficient documentation

## 2021-01-24 DIAGNOSIS — E785 Hyperlipidemia, unspecified: Secondary | ICD-10-CM | POA: Insufficient documentation

## 2021-01-24 LAB — CBC WITH DIFFERENTIAL/PLATELET
Abs Immature Granulocytes: 0.02 10*3/uL (ref 0.00–0.07)
Basophils Absolute: 0.1 10*3/uL (ref 0.0–0.1)
Basophils Relative: 1 %
Eosinophils Absolute: 0.2 10*3/uL (ref 0.0–0.5)
Eosinophils Relative: 3 %
HCT: 37.1 % (ref 36.0–46.0)
Hemoglobin: 12.3 g/dL (ref 12.0–15.0)
Immature Granulocytes: 0 %
Lymphocytes Relative: 35 %
Lymphs Abs: 2.4 10*3/uL (ref 0.7–4.0)
MCH: 30.6 pg (ref 26.0–34.0)
MCHC: 33.2 g/dL (ref 30.0–36.0)
MCV: 92.3 fL (ref 80.0–100.0)
Monocytes Absolute: 0.5 10*3/uL (ref 0.1–1.0)
Monocytes Relative: 7 %
Neutro Abs: 3.7 10*3/uL (ref 1.7–7.7)
Neutrophils Relative %: 54 %
Platelets: 224 10*3/uL (ref 150–400)
RBC: 4.02 MIL/uL (ref 3.87–5.11)
RDW: 14.5 % (ref 11.5–15.5)
WBC: 6.8 10*3/uL (ref 4.0–10.5)
nRBC: 0 % (ref 0.0–0.2)

## 2021-01-24 LAB — VITAMIN D 25 HYDROXY (VIT D DEFICIENCY, FRACTURES): Vit D, 25-Hydroxy: 58.6 ng/mL (ref 30–100)

## 2021-01-24 LAB — IRON AND TIBC
Iron: 81 ug/dL (ref 28–170)
Saturation Ratios: 26 % (ref 10.4–31.8)
TIBC: 308 ug/dL (ref 250–450)
UIBC: 227 ug/dL

## 2021-01-24 LAB — COMPREHENSIVE METABOLIC PANEL
ALT: 38 U/L (ref 0–44)
AST: 25 U/L (ref 15–41)
Albumin: 3.8 g/dL (ref 3.5–5.0)
Alkaline Phosphatase: 75 U/L (ref 38–126)
Anion gap: 9 (ref 5–15)
BUN: 38 mg/dL — ABNORMAL HIGH (ref 8–23)
CO2: 28 mmol/L (ref 22–32)
Calcium: 9.7 mg/dL (ref 8.9–10.3)
Chloride: 100 mmol/L (ref 98–111)
Creatinine, Ser: 1.22 mg/dL — ABNORMAL HIGH (ref 0.44–1.00)
GFR, Estimated: 43 mL/min — ABNORMAL LOW (ref >60)
Glucose, Bld: 96 mg/dL (ref 70–99)
Potassium: 3.7 mmol/L (ref 3.5–5.1)
Sodium: 137 mmol/L (ref 135–145)
Total Bilirubin: 0.6 mg/dL (ref 0.3–1.2)
Total Protein: 7.4 g/dL (ref 6.5–8.1)

## 2021-01-24 LAB — HEPATITIS B SURFACE ANTIGEN: Hepatitis B Surface Ag: NONREACTIVE

## 2021-01-24 LAB — PHOSPHORUS: Phosphorus: 3 mg/dL (ref 2.5–4.6)

## 2021-01-24 LAB — URIC ACID: Uric Acid, Serum: 6 mg/dL (ref 2.5–7.1)

## 2021-01-24 LAB — HEPATITIS C ANTIBODY: HCV Ab: NONREACTIVE

## 2021-01-24 LAB — VITAMIN B12: Vitamin B-12: 299 pg/mL (ref 180–914)

## 2021-01-24 LAB — SEDIMENTATION RATE: Sed Rate: 50 mm/hr — ABNORMAL HIGH (ref 0–22)

## 2021-01-24 LAB — MAGNESIUM: Magnesium: 2.4 mg/dL (ref 1.7–2.4)

## 2021-01-25 ENCOUNTER — Other Ambulatory Visit (HOSPITAL_COMMUNITY): Payer: Medicare HMO

## 2021-01-25 LAB — KAPPA/LAMBDA LIGHT CHAINS
Kappa free light chain: 55.7 mg/L — ABNORMAL HIGH (ref 3.3–19.4)
Kappa, lambda light chain ratio: 2.32 — ABNORMAL HIGH (ref 0.26–1.65)
Lambda free light chains: 24 mg/L (ref 5.7–26.3)

## 2021-01-25 LAB — PROTEIN ELECTROPHORESIS, SERUM
A/G Ratio: 0.9 (ref 0.7–1.7)
Albumin ELP: 3.7 g/dL (ref 2.9–4.4)
Alpha-1-Globulin: 0.3 g/dL (ref 0.0–0.4)
Alpha-2-Globulin: 0.8 g/dL (ref 0.4–1.0)
Beta Globulin: 1.3 g/dL (ref 0.7–1.3)
Gamma Globulin: 1.5 g/dL (ref 0.4–1.8)
Globulin, Total: 4 g/dL — ABNORMAL HIGH (ref 2.2–3.9)
M-Spike, %: 0.3 g/dL — ABNORMAL HIGH
Total Protein ELP: 7.7 g/dL (ref 6.0–8.5)

## 2021-01-25 LAB — HEPATITIS B SURFACE ANTIBODY, QUANTITATIVE: Hep B S AB Quant (Post): 5.6 m[IU]/mL — ABNORMAL LOW (ref >9.9)

## 2021-01-25 LAB — HEMOGLOBIN A1C
Hgb A1c MFr Bld: 5.9 % — ABNORMAL HIGH (ref 4.8–5.6)
Mean Plasma Glucose: 123 mg/dL

## 2021-01-25 LAB — C4 COMPLEMENT: Complement C4, Body Fluid: 29 mg/dL (ref 12–38)

## 2021-01-25 LAB — C3 COMPLEMENT: C3 Complement: 189 mg/dL — ABNORMAL HIGH (ref 82–167)

## 2021-01-25 LAB — MISC LABCORP TEST (SEND OUT): Labcorp test code: 141330

## 2021-01-25 LAB — PTH, INTACT AND CALCIUM
Calcium, Total (PTH): 10.2 mg/dL (ref 8.7–10.3)
PTH: 31 pg/mL (ref 15–65)

## 2021-01-25 LAB — GLOMERULAR BASEMENT MEMBRANE ANTIBODIES: GBM Ab: <0.2 units (ref 0.0–0.9)

## 2021-01-25 LAB — ANA W/REFLEX: Anti Nuclear Antibody (ANA): NEGATIVE

## 2021-01-29 ENCOUNTER — Ambulatory Visit (HOSPITAL_COMMUNITY): Payer: Medicare HMO

## 2021-01-29 LAB — ANCA TITERS
Atypical P-ANCA titer: <1:20 {titer}
C-ANCA: <1:20 {titer}
P-ANCA: <1:20 {titer}

## 2021-01-30 ENCOUNTER — Encounter: Payer: Self-pay | Admitting: Adult Health

## 2021-01-30 ENCOUNTER — Non-Acute Institutional Stay (SKILLED_NURSING_FACILITY): Payer: Medicare HMO | Admitting: Adult Health

## 2021-01-30 DIAGNOSIS — E1122 Type 2 diabetes mellitus with diabetic chronic kidney disease: Secondary | ICD-10-CM | POA: Diagnosis not present

## 2021-01-30 DIAGNOSIS — N1832 Chronic kidney disease, stage 3b: Secondary | ICD-10-CM

## 2021-01-30 DIAGNOSIS — I129 Hypertensive chronic kidney disease with stage 1 through stage 4 chronic kidney disease, or unspecified chronic kidney disease: Secondary | ICD-10-CM

## 2021-01-30 DIAGNOSIS — M2578 Osteophyte, vertebrae: Secondary | ICD-10-CM

## 2021-01-30 DIAGNOSIS — N183 Chronic kidney disease, stage 3 unspecified: Secondary | ICD-10-CM | POA: Diagnosis not present

## 2021-01-30 LAB — HEPATITIS C GENOTYPE

## 2021-01-30 NOTE — Progress Notes (Signed)
Location:  [place] Room Number: 151-P Place of Service:  SNF (31)   CODE STATUS: Full Code   Allergies  Allergen Reactions  . Adhesive [Tape]   . Latex     Chief Complaint  Patient presents with  . Medical Management of Chronic Issues           Cervical osteophyte fracture      CKD stage 3 due to type 2 diabetes mellitus. Type 2 diabetes mellitus with stage 3 chronic kidney disease without long term current use of insulin:     Hypertension associated with stage 3 chronic kidney disease due to type 2 diabetes mellitus    HPI:  She is a 87 year old long term resident of this facility being seen for the management of her chronic illnesses: Cervical osteophyte fracture      CKD stage 3 due to type 2 diabetes mellitus. Type 2 diabetes mellitus with stage 3 chronic kidney disease without long term current use of insulin:     Hypertension associated with stage 3 chronic kidney disease due to type 2 diabetes mellitus.  There are no reports of uncontrolled pain is off vicodin. No reports of changes in appetite; no reports of anxiety or insomnia.   Past Medical History:  Diagnosis Date  . Arthritis   . Diabetes mellitus type II   . GERD (gastroesophageal reflux disease)   . Hyperlipidemia   . Hypertension   . Mitral insufficiency     Past Surgical History:  Procedure Laterality Date  . ABDOMINAL HYSTERECTOMY     partial   . BACK SURGERY  1979, 1987, 2000  . FRACTURE SURGERY     hip (bilateral)  . JOINT REPLACEMENT    . PARTIAL HYSTERECTOMY    . SPINE SURGERY    . TOTAL HIP ARTHROPLASTY      Social History   Socioeconomic History  . Marital status: Widowed    Spouse name: Not on file  . Number of children: 2  . Years of education: 8  . Highest education level: 8th grade  Occupational History  . Occupation: Retired  Tobacco Use  . Smoking status: Former    Packs/day: 1.50    Pack years: 0.00    Types: Cigarettes  . Smokeless tobacco: Never   Vaping Use  . Vaping Use: Never used  Substance and Sexual Activity  . Alcohol use: No  . Drug use: No  . Sexual activity: Not Currently    Birth control/protection: Post-menopausal  Other Topics Concern  . Not on file  Social History Narrative   Right handed    Lives with family    Social Determinants of Health   Financial Resource Strain: Low Risk   . Difficulty of Paying Living Expenses: Not hard at all  Food Insecurity: No Food Insecurity  . Worried About Charity fundraiser in the Last Year: Never true  . Ran Out of Food in the Last Year: Never true  Transportation Needs: No Transportation Needs  . Lack of Transportation (Medical): No  . Lack of Transportation (Non-Medical): No  Physical Activity: Inactive  . Days of Exercise per Week: 0 days  . Minutes of Exercise per Session: 0 min  Stress: No Stress Concern Present  . Feeling of Stress : Not at all  Social Connections: Moderately Isolated  . Frequency of Communication with Friends and Family: More than three times a week  . Frequency of Social Gatherings with Friends and Family:  More than three times a week  . Attends Religious Services: More than 4 times per year  . Active Member of Clubs or Organizations: No  . Attends Archivist Meetings: Never  . Marital Status: Widowed  Intimate Partner Violence: Not At Risk  . Fear of Current or Ex-Partner: No  . Emotionally Abused: No  . Physically Abused: No  . Sexually Abused: No   Family History  Problem Relation Age of Onset  . Heart attack Father 16  . Heart disease Father        before age 82  . Mitral valve prolapse Daughter   . Stroke Mother   . ALS Brother   . Heart attack Son   . Heart disease Son        before age 50  . Coronary artery disease Other       VITAL SIGNS BP 124/74   Pulse 65   Temp 97.9 F (36.6 C)   Resp 20   Ht 5\' 7"  (1.702 m)   Wt 133 lb 6.4 oz (60.5 kg)   SpO2 97%   BMI 20.89 kg/m   Outpatient Encounter  Medications as of 01/30/2021  Medication Sig  . acetaminophen (TYLENOL) 325 MG tablet Take 650 mg by mouth every 6 (six) hours as needed.  Marland Kitchen allopurinol (ZYLOPRIM) 100 MG tablet Take 100 mg by mouth daily. Reported on 09/04/2015  . Cholecalciferol (D3-1000) 25 MCG (1000 UT) tablet Take 2,000 Units by mouth daily.  Marland Kitchen gabapentin (NEURONTIN) 300 MG capsule Take 300 mg by mouth 2 (two) times daily.  . memantine (NAMENDA) 10 MG tablet Take 10 mg by mouth 2 (two) times daily.  . NON FORMULARY Diet: _____ Regular,   ___x___ NAS, ____x___Consistent Carbohydrate,  _______NPO _____Other  . potassium chloride (KLOR-CON) 10 MEQ tablet Take 10 mEq by mouth daily.  . QUEtiapine (SEROQUEL) 25 MG tablet Take 25 mg by mouth at bedtime.  Marland Kitchen spironolactone (ALDACTONE) 25 MG tablet Take 25 mg by mouth daily.  Marland Kitchen torsemide (DEMADEX) 20 MG tablet Take 20 mg by mouth daily.  . vitamin B-12 (CYANOCOBALAMIN) 1000 MCG tablet Take 1,000 mcg by mouth daily.   No facility-administered encounter medications on file as of 01/30/2021.     SIGNIFICANT DIAGNOSTIC EXAMS  PREVIOUS   12-26-20; ct of head and cervical spine:  1. No acute intracranial pathology. 2. Fracture through a right anterior C5-6 bridging osteophyte with 3 mm of distraction. 3. High-density fluid and stranding in the soft tissues of the left neck concerning for a hemorrhagic contusion. 4. Cervical spine spondylosis as described above  12-28-20: bilateral lower extremity venous doppler: No evidence of deep venous thrombosis in either lower extremity.   NO NEW EXAMS.   LABS REVIEWED PREVIOUS  12-26-20: wbc 9.4; hgb 12.5; hct 36.9; mcv 90.7 plt 181; glucose 156; bun 19; creat 1.17; k+ 3.1; na++ 137 ca 9.0; GFR 46; liver normal albumin 3.6; CK 834 12-27-20: wbc 8.1; hgb 11.6; hct 3.5; hct 35; mcv 91.6 plt 163 12-28-20: glucose 92; bun 21; creat 1.22; k+ 4.1; na++ 136; ca 9.0; GFR 43  TODAY  01-24-21; wbc 6.8; hgb 12.3; hct 37.1; mcv 92.3 plt 224; glucose 96;  bun 38; creat 1.22 ;k+ 3.7; na++ 137; ca 9.7; GFR 43; liver normal albumin 3.8; PTH 31; mag 2.4; phos 3.0; sed ate 50; uric acid 6.0; vit D 58.60; hgb a1c 5.9; iron 81; tibc 308; vit B 12: 299    Review of Systems  Constitutional:  Negative for malaise/fatigue.  Respiratory:  Negative for cough and shortness of breath.   Cardiovascular:  Negative for chest pain, palpitations and leg swelling.  Gastrointestinal:  Negative for abdominal pain, constipation and heartburn.  Musculoskeletal:  Negative for back pain, joint pain and myalgias.  Skin: Negative.   Neurological:  Negative for dizziness.  Psychiatric/Behavioral:  The patient is not nervous/anxious.    Physical Exam Constitutional:      General: She is not in acute distress.    Appearance: She is well-developed. She is not diaphoretic.  Neck:     Thyroid: No thyromegaly.  Cardiovascular:     Rate and Rhythm: Normal rate and regular rhythm.     Heart sounds: Normal heart sounds.  Pulmonary:     Effort: Pulmonary effort is normal. No respiratory distress.     Breath sounds: Normal breath sounds.  Abdominal:     General: Bowel sounds are normal. There is no distension.     Palpations: Abdomen is soft.     Tenderness: There is no abdominal tenderness.  Musculoskeletal:        General: Normal range of motion.     Cervical back: Neck supple.     Right lower leg: Edema present.     Left lower leg: Edema present.     Comments: Trace to  bilateral lower extremity edema C-collar in place  Lymphadenopathy:     Cervical: No cervical adenopathy.  Skin:    General: Skin is warm and dry.  Neurological:     Mental Status: She is alert. Mental status is at baseline.  Psychiatric:        Mood and Affect: Mood normal.    ASSESSMENT/ PLAN:  TODAY  Cervical osteophyte fracture is stable has c-collar in place will monitor   2. CKD stage 3 due to type 2 diabetes mellitus: is stable bun 38; creat 1.22 GFR 43 will monitor  3. Type 2  diabetes mellitus with stage 3 chronic kidney disease without long term current use of insulin: is stable hgb a1c 5.9; will continue to monitor her status.   4. Hypertension associated with stage 3 chronic kidney disease due to type 2 diabetes mellitus: is stable b/p 124/74 will continue aldactone 25 mg daily   PREVIOUS   5. Bilateral lower extremity edema: is stable will continue demadex 20 mg daily   6. Chronic gout due to renal impairment without tophi unspecified site: is stable no recent outbreak uric acid 6.0  will continue allopurinol 100 mg daily   7. Psychosis in elderly without behavioral disturbance: is stable will continue serouqel 25 mg nightly   8. Vascular dementia without behavioral disturbance: is stable weight is 133 pounds  will continue namenda 10 mg twice daily   9. Hypokalemia: is stable k+ is 3.7; will continue k+ 10 meq daily   10. Diabetic peripheral neuropathy due to diabetes mellitus type 2: is stable will continue gabapentin 300 mg twice daily         Ok Edwards NP [facility]  Contact [phone] Monday through Friday 8am- 5pm  After hours call [phone]

## 2021-02-01 ENCOUNTER — Ambulatory Visit (HOSPITAL_COMMUNITY): Payer: Medicare HMO | Attending: Nephrology

## 2021-02-01 ENCOUNTER — Other Ambulatory Visit: Payer: Self-pay

## 2021-02-01 DIAGNOSIS — D638 Anemia in other chronic diseases classified elsewhere: Secondary | ICD-10-CM | POA: Insufficient documentation

## 2021-02-01 DIAGNOSIS — E876 Hypokalemia: Secondary | ICD-10-CM | POA: Insufficient documentation

## 2021-02-01 DIAGNOSIS — Z79899 Other long term (current) drug therapy: Secondary | ICD-10-CM | POA: Diagnosis not present

## 2021-02-01 DIAGNOSIS — N170 Acute kidney failure with tubular necrosis: Secondary | ICD-10-CM | POA: Insufficient documentation

## 2021-02-01 DIAGNOSIS — E1122 Type 2 diabetes mellitus with diabetic chronic kidney disease: Secondary | ICD-10-CM | POA: Insufficient documentation

## 2021-02-01 DIAGNOSIS — I129 Hypertensive chronic kidney disease with stage 1 through stage 4 chronic kidney disease, or unspecified chronic kidney disease: Secondary | ICD-10-CM | POA: Diagnosis not present

## 2021-02-01 DIAGNOSIS — N189 Chronic kidney disease, unspecified: Secondary | ICD-10-CM | POA: Diagnosis not present

## 2021-02-01 DIAGNOSIS — R32 Unspecified urinary incontinence: Secondary | ICD-10-CM | POA: Insufficient documentation

## 2021-02-01 DIAGNOSIS — I10 Essential (primary) hypertension: Secondary | ICD-10-CM | POA: Diagnosis not present

## 2021-02-01 DIAGNOSIS — R600 Localized edema: Secondary | ICD-10-CM | POA: Diagnosis not present

## 2021-02-01 LAB — ECHOCARDIOGRAM COMPLETE
Area-P 1/2: 3.46 cm2
Height: 67 in
S' Lateral: 2.4 cm
Weight: 2134.4 oz

## 2021-02-01 NOTE — Progress Notes (Signed)
  Echocardiogram 2D Echocardiogram has been performed.  Jasmine Bridges 02/01/2021, 10:40 AM

## 2021-02-05 DIAGNOSIS — R030 Elevated blood-pressure reading, without diagnosis of hypertension: Secondary | ICD-10-CM | POA: Diagnosis not present

## 2021-02-05 DIAGNOSIS — S12501A Unspecified nondisplaced fracture of sixth cervical vertebra, initial encounter for closed fracture: Secondary | ICD-10-CM | POA: Diagnosis not present

## 2021-02-06 DIAGNOSIS — N170 Acute kidney failure with tubular necrosis: Secondary | ICD-10-CM | POA: Diagnosis not present

## 2021-02-06 DIAGNOSIS — D472 Monoclonal gammopathy: Secondary | ICD-10-CM | POA: Diagnosis not present

## 2021-02-06 DIAGNOSIS — I129 Hypertensive chronic kidney disease with stage 1 through stage 4 chronic kidney disease, or unspecified chronic kidney disease: Secondary | ICD-10-CM | POA: Diagnosis not present

## 2021-02-06 DIAGNOSIS — N281 Cyst of kidney, acquired: Secondary | ICD-10-CM | POA: Diagnosis not present

## 2021-02-06 DIAGNOSIS — E1122 Type 2 diabetes mellitus with diabetic chronic kidney disease: Secondary | ICD-10-CM | POA: Diagnosis not present

## 2021-02-06 DIAGNOSIS — N189 Chronic kidney disease, unspecified: Secondary | ICD-10-CM | POA: Diagnosis not present

## 2021-02-11 ENCOUNTER — Other Ambulatory Visit: Payer: Self-pay | Admitting: Adult Health

## 2021-02-11 ENCOUNTER — Encounter: Payer: Self-pay | Admitting: Adult Health

## 2021-02-11 ENCOUNTER — Non-Acute Institutional Stay (SKILLED_NURSING_FACILITY): Payer: Medicare HMO | Admitting: Adult Health

## 2021-02-11 DIAGNOSIS — M2578 Osteophyte, vertebrae: Secondary | ICD-10-CM

## 2021-02-11 DIAGNOSIS — T796XXS Traumatic ischemia of muscle, sequela: Secondary | ICD-10-CM

## 2021-02-11 DIAGNOSIS — F0150 Vascular dementia without behavioral disturbance: Secondary | ICD-10-CM | POA: Diagnosis not present

## 2021-02-11 DIAGNOSIS — I129 Hypertensive chronic kidney disease with stage 1 through stage 4 chronic kidney disease, or unspecified chronic kidney disease: Secondary | ICD-10-CM

## 2021-02-11 DIAGNOSIS — E1122 Type 2 diabetes mellitus with diabetic chronic kidney disease: Secondary | ICD-10-CM | POA: Diagnosis not present

## 2021-02-11 DIAGNOSIS — E1142 Type 2 diabetes mellitus with diabetic polyneuropathy: Secondary | ICD-10-CM | POA: Diagnosis not present

## 2021-02-11 DIAGNOSIS — M6281 Muscle weakness (generalized): Secondary | ICD-10-CM | POA: Diagnosis not present

## 2021-02-11 DIAGNOSIS — S12490D Other displaced fracture of fifth cervical vertebra, subsequent encounter for fracture with routine healing: Secondary | ICD-10-CM | POA: Diagnosis not present

## 2021-02-11 DIAGNOSIS — Z978 Presence of other specified devices: Secondary | ICD-10-CM | POA: Diagnosis not present

## 2021-02-11 DIAGNOSIS — N1832 Chronic kidney disease, stage 3b: Secondary | ICD-10-CM

## 2021-02-11 DIAGNOSIS — R262 Difficulty in walking, not elsewhere classified: Secondary | ICD-10-CM | POA: Diagnosis not present

## 2021-02-11 MED ORDER — QUETIAPINE FUMARATE 25 MG PO TABS: 25.0000 mg | ORAL_TABLET | Freq: Every day | ORAL | 0 refills | Status: AC

## 2021-02-11 MED ORDER — TORSEMIDE 20 MG PO TABS: 20.0000 mg | ORAL_TABLET | Freq: Every day | ORAL | 0 refills | Status: AC

## 2021-02-11 MED ORDER — GABAPENTIN 300 MG PO CAPS: 300.0000 mg | ORAL_CAPSULE | Freq: Two times a day (BID) | ORAL | 0 refills | Status: AC

## 2021-02-11 MED ORDER — ALLOPURINOL 100 MG PO TABS: 100.0000 mg | ORAL_TABLET | Freq: Every day | ORAL | 0 refills | Status: AC

## 2021-02-11 MED ORDER — MEMANTINE HCL 10 MG PO TABS: 10.0000 mg | ORAL_TABLET | Freq: Two times a day (BID) | ORAL | 0 refills | Status: AC

## 2021-02-11 MED ORDER — SPIRONOLACTONE 25 MG PO TABS: 25.0000 mg | ORAL_TABLET | Freq: Every day | ORAL | 0 refills | Status: AC

## 2021-02-11 MED ORDER — POTASSIUM CHLORIDE CRYS ER 10 MEQ PO TBCR: 10.0000 meq | EXTENDED_RELEASE_TABLET | Freq: Every day | ORAL | 0 refills | Status: AC

## 2021-02-11 NOTE — Progress Notes (Signed)
Location:    [facility] Room Number: 151-P Place of Service:  SNF (31)    CODE STATUS: Full  Allergies  Allergen Reactions  . Adhesive [Tape]   . Latex     Chief Complaint  Patient presents with  . Discharge Note    Discharge to home.    HPI:  She is being discharged to home with home health for pt/ot/rn/cna. she will need a wheelchair. she will need his prescriptions written and will need to follow up with his medical provider. She had been hospitalized after a fall suffering rhabdomyolysis and C5-6 fracture. She was admitted to this facility for short term rehab. Her c-collar is now off. She has participated in pt and ot is now ready to complete therapy on a home health basis.   Past Medical History:  Diagnosis Date  . Arthritis   . Diabetes mellitus type II   . GERD (gastroesophageal reflux disease)   . Hyperlipidemia   . Hypertension   . Mitral insufficiency     Past Surgical History:  Procedure Laterality Date  . ABDOMINAL HYSTERECTOMY     partial   . BACK SURGERY  1979, 1987, 2000  . FRACTURE SURGERY     hip (bilateral)  . JOINT REPLACEMENT    . PARTIAL HYSTERECTOMY    . SPINE SURGERY    . TOTAL HIP ARTHROPLASTY      Social History   Socioeconomic History  . Marital status: Widowed    Spouse name: Not on file  . Number of children: 2  . Years of education: 8  . Highest education level: 8th grade  Occupational History  . Occupation: Retired  Tobacco Use  . Smoking status: Former    Packs/day: 1.50    Types: Cigarettes  . Smokeless tobacco: Never  Vaping Use  . Vaping Use: Never used  Substance and Sexual Activity  . Alcohol use: No  . Drug use: No  . Sexual activity: Not Currently    Birth control/protection: Post-menopausal  Other Topics Concern  . Not on file  Social History Narrative   Right handed    Lives with family    Social Determinants of Health   Financial Resource Strain: Low Risk   . Difficulty of  Paying Living Expenses: Not hard at all  Food Insecurity: No Food Insecurity  . Worried About Charity fundraiser in the Last Year: Never true  . Ran Out of Food in the Last Year: Never true  Transportation Needs: No Transportation Needs  . Lack of Transportation (Medical): No  . Lack of Transportation (Non-Medical): No  Physical Activity: Inactive  . Days of Exercise per Week: 0 days  . Minutes of Exercise per Session: 0 min  Stress: No Stress Concern Present  . Feeling of Stress : Not at all  Social Connections: Moderately Isolated  . Frequency of Communication with Friends and Family: More than three times a week  . Frequency of Social Gatherings with Friends and Family: More than three times a week  . Attends Religious Services: More than 4 times per year  . Active Member of Clubs or Organizations: No  . Attends Archivist Meetings: Never  . Marital Status: Widowed  Intimate Partner Violence: Not At Risk  . Fear of Current or Ex-Partner: No  . Emotionally Abused: No  . Physically Abused: No  . Sexually Abused: No   Family History  Problem Relation Age of Onset  . Heart attack  Father 10  . Heart disease Father        before age 54  . Mitral valve prolapse Daughter   . Stroke Mother   . ALS Brother   . Heart attack Son   . Heart disease Son        before age 102  . Coronary artery disease Other     VITAL SIGNS BP 124/74   Pulse 65   Temp 97.9 F (36.6 C)   Resp 20   Ht 5\' 7"  (1.702 m)   Wt 133 lb 6.4 oz (60.5 kg)   SpO2 97%   BMI 20.89 kg/m   Patient's Medications  New Prescriptions   No medications on file  Previous Medications   ACETAMINOPHEN (TYLENOL) 325 MG TABLET    Take 650 mg by mouth every 6 (six) hours as needed.   ALLOPURINOL (ZYLOPRIM) 100 MG TABLET    Take 1 tablet (100 mg total) by mouth daily. Reported on 09/04/2015   CHOLECALCIFEROL (D3-1000) 25 MCG (1000 UT) TABLET    Take 2,000 Units by mouth daily.   GABAPENTIN (NEURONTIN) 300 MG  CAPSULE    Take 1 capsule (300 mg total) by mouth 2 (two) times daily.   MEMANTINE (NAMENDA) 10 MG TABLET    Take 1 tablet (10 mg total) by mouth 2 (two) times daily.   NON FORMULARY    Diet: _____ Regular,   ___x___ NAS, ____x___Consistent Carbohydrate,  _______NPO _____Other   POTASSIUM CHLORIDE (KLOR-CON) 10 MEQ TABLET    Take 1 tablet (10 mEq total) by mouth daily.   QUETIAPINE (SEROQUEL) 25 MG TABLET    Take 1 tablet (25 mg total) by mouth at bedtime.   SPIRONOLACTONE (ALDACTONE) 25 MG TABLET    Take 1 tablet (25 mg total) by mouth daily.   TORSEMIDE (DEMADEX) 20 MG TABLET    Take 1 tablet (20 mg total) by mouth daily.   VITAMIN B-12 (CYANOCOBALAMIN) 1000 MCG TABLET    Take 1,000 mcg by mouth daily.  Modified Medications   No medications on file  Discontinued Medications   No medications on file     SIGNIFICANT DIAGNOSTIC EXAMS   PREVIOUS   12-26-20; ct of head and cervical spine:  1. No acute intracranial pathology. 2. Fracture through a right anterior C5-6 bridging osteophyte with 3 mm of distraction. 3. High-density fluid and stranding in the soft tissues of the left neck concerning for a hemorrhagic contusion. 4. Cervical spine spondylosis as described above  12-28-20: bilateral lower extremity venous doppler: No evidence of deep venous thrombosis in either lower extremity.   NO NEW EXAMS.   LABS REVIEWED PREVIOUS  12-26-20: wbc 9.4; hgb 12.5; hct 36.9; mcv 90.7 plt 181; glucose 156; bun 19; creat 1.17; k+ 3.1; na++ 137 ca 9.0; GFR 46; liver normal albumin 3.6; CK 834 12-27-20: wbc 8.1; hgb 11.6; hct 3.5; hct 35; mcv 91.6 plt 163 12-28-20: glucose 92; bun 21; creat 1.22; k+ 4.1; na++ 136; ca 9.0; GFR 43 01-24-21; wbc 6.8; hgb 12.3; hct 37.1; mcv 92.3 plt 224; glucose 96; bun 38; creat 1.22 ;k+ 3.7; na++ 137; ca 9.7; GFR 43; liver normal albumin 3.8; PTH 31; mag 2.4; phos 3.0; sed ate 50; uric acid 6.0; vit D 58.60; hgb a1c 5.9; iron 81; tibc 308; vit B 12: 299   NO NEW LABS.    Review of Systems  Constitutional:  Negative for malaise/fatigue.  Respiratory:  Negative for cough and shortness of breath.   Cardiovascular:  Negative for chest pain, palpitations and leg swelling.  Gastrointestinal:  Negative for abdominal pain, constipation and heartburn.  Musculoskeletal:  Negative for back pain, joint pain and myalgias.  Skin: Negative.   Neurological:  Negative for dizziness.  Psychiatric/Behavioral:  The patient is not nervous/anxious.    Physical Exam Constitutional:      General: She is not in acute distress.    Appearance: She is well-developed. She is not diaphoretic.  Neck:     Thyroid: No thyromegaly.  Cardiovascular:     Rate and Rhythm: Normal rate and regular rhythm.     Pulses: Normal pulses.     Heart sounds: Normal heart sounds.  Pulmonary:     Effort: Pulmonary effort is normal. No respiratory distress.     Breath sounds: Normal breath sounds.  Abdominal:     General: Bowel sounds are normal. There is no distension.     Palpations: Abdomen is soft.     Tenderness: There is no abdominal tenderness.  Musculoskeletal:        General: Normal range of motion.     Cervical back: Neck supple.     Right lower leg: No edema.     Left lower leg: No edema.  Lymphadenopathy:     Cervical: No cervical adenopathy.  Skin:    General: Skin is warm and dry.  Neurological:     Mental Status: She is alert. Mental status is at baseline.  Psychiatric:        Mood and Affect: Mood normal.      ASSESSMENT/ PLAN:  Patient is being discharged with the following home health services:  pt/ot/t/cna/rn : to evaluate and treat as indicated for gait balance strength adl training medication management and adl care.   Patient is being discharged with the following durable medical equipment:  wheelchair to allow her to maintain her current level of independence with her adls which cannot be achieved with a cane crutches walker. She is able to propel herself in  wheelchair.   Patient has been advised to f/u with their PCP in 1-2 weeks to bring them up to date on their rehab stay.  Social services at facility was responsible for arranging this appointment.  Pt was provided with a 30 day supply of prescriptions for medications and refills must be obtained from their PCP.  For controlled substances, a more limited supply may be provided adequate until PCP appointment only.   A 30 day supply of her prescription medications have been sent to [place]  Time spent with patient: 45 minutes: dme; medications; home health.    Ok Edwards NP [facility]  Contact [phone] Monday through Friday 8am- 5pm  After hours call [phone]

## 2021-02-14 DIAGNOSIS — M2578 Osteophyte, vertebrae: Secondary | ICD-10-CM | POA: Diagnosis not present

## 2021-02-14 DIAGNOSIS — I129 Hypertensive chronic kidney disease with stage 1 through stage 4 chronic kidney disease, or unspecified chronic kidney disease: Secondary | ICD-10-CM | POA: Diagnosis not present

## 2021-02-14 DIAGNOSIS — S12590D Other displaced fracture of sixth cervical vertebra, subsequent encounter for fracture with routine healing: Secondary | ICD-10-CM | POA: Diagnosis not present

## 2021-02-14 DIAGNOSIS — N1832 Chronic kidney disease, stage 3b: Secondary | ICD-10-CM | POA: Diagnosis not present

## 2021-02-14 DIAGNOSIS — M47812 Spondylosis without myelopathy or radiculopathy, cervical region: Secondary | ICD-10-CM | POA: Diagnosis not present

## 2021-02-14 DIAGNOSIS — E1122 Type 2 diabetes mellitus with diabetic chronic kidney disease: Secondary | ICD-10-CM | POA: Diagnosis not present

## 2021-02-14 DIAGNOSIS — T796XXD Traumatic ischemia of muscle, subsequent encounter: Secondary | ICD-10-CM | POA: Diagnosis not present

## 2021-02-14 DIAGNOSIS — M4802 Spinal stenosis, cervical region: Secondary | ICD-10-CM | POA: Diagnosis not present

## 2021-02-14 DIAGNOSIS — S12490D Other displaced fracture of fifth cervical vertebra, subsequent encounter for fracture with routine healing: Secondary | ICD-10-CM | POA: Diagnosis not present

## 2021-02-18 DIAGNOSIS — M2578 Osteophyte, vertebrae: Secondary | ICD-10-CM | POA: Diagnosis not present

## 2021-02-18 DIAGNOSIS — G609 Hereditary and idiopathic neuropathy, unspecified: Secondary | ICD-10-CM | POA: Diagnosis not present

## 2021-02-18 DIAGNOSIS — I129 Hypertensive chronic kidney disease with stage 1 through stage 4 chronic kidney disease, or unspecified chronic kidney disease: Secondary | ICD-10-CM | POA: Diagnosis not present

## 2021-02-18 DIAGNOSIS — M1009 Idiopathic gout, multiple sites: Secondary | ICD-10-CM | POA: Diagnosis not present

## 2021-02-18 DIAGNOSIS — E1122 Type 2 diabetes mellitus with diabetic chronic kidney disease: Secondary | ICD-10-CM | POA: Diagnosis not present

## 2021-02-18 DIAGNOSIS — M4802 Spinal stenosis, cervical region: Secondary | ICD-10-CM | POA: Diagnosis not present

## 2021-02-18 DIAGNOSIS — M47812 Spondylosis without myelopathy or radiculopathy, cervical region: Secondary | ICD-10-CM | POA: Diagnosis not present

## 2021-02-18 DIAGNOSIS — S12490D Other displaced fracture of fifth cervical vertebra, subsequent encounter for fracture with routine healing: Secondary | ICD-10-CM | POA: Diagnosis not present

## 2021-02-18 DIAGNOSIS — M810 Age-related osteoporosis without current pathological fracture: Secondary | ICD-10-CM | POA: Diagnosis not present

## 2021-02-18 DIAGNOSIS — T796XXD Traumatic ischemia of muscle, subsequent encounter: Secondary | ICD-10-CM | POA: Diagnosis not present

## 2021-02-18 DIAGNOSIS — F0391 Unspecified dementia with behavioral disturbance: Secondary | ICD-10-CM | POA: Diagnosis not present

## 2021-02-18 DIAGNOSIS — N1832 Chronic kidney disease, stage 3b: Secondary | ICD-10-CM | POA: Diagnosis not present

## 2021-02-18 DIAGNOSIS — N1831 Chronic kidney disease, stage 3a: Secondary | ICD-10-CM | POA: Diagnosis not present

## 2021-02-18 DIAGNOSIS — I10 Essential (primary) hypertension: Secondary | ICD-10-CM | POA: Diagnosis not present

## 2021-02-18 DIAGNOSIS — M545 Low back pain, unspecified: Secondary | ICD-10-CM | POA: Diagnosis not present

## 2021-02-18 DIAGNOSIS — E782 Mixed hyperlipidemia: Secondary | ICD-10-CM | POA: Diagnosis not present

## 2021-02-18 DIAGNOSIS — S12590D Other displaced fracture of sixth cervical vertebra, subsequent encounter for fracture with routine healing: Secondary | ICD-10-CM | POA: Diagnosis not present

## 2021-02-19 DIAGNOSIS — I129 Hypertensive chronic kidney disease with stage 1 through stage 4 chronic kidney disease, or unspecified chronic kidney disease: Secondary | ICD-10-CM | POA: Diagnosis not present

## 2021-02-19 DIAGNOSIS — M4802 Spinal stenosis, cervical region: Secondary | ICD-10-CM | POA: Diagnosis not present

## 2021-02-19 DIAGNOSIS — T796XXD Traumatic ischemia of muscle, subsequent encounter: Secondary | ICD-10-CM | POA: Diagnosis not present

## 2021-02-19 DIAGNOSIS — S12490D Other displaced fracture of fifth cervical vertebra, subsequent encounter for fracture with routine healing: Secondary | ICD-10-CM | POA: Diagnosis not present

## 2021-02-19 DIAGNOSIS — M47812 Spondylosis without myelopathy or radiculopathy, cervical region: Secondary | ICD-10-CM | POA: Diagnosis not present

## 2021-02-19 DIAGNOSIS — E1122 Type 2 diabetes mellitus with diabetic chronic kidney disease: Secondary | ICD-10-CM | POA: Diagnosis not present

## 2021-02-19 DIAGNOSIS — S12590D Other displaced fracture of sixth cervical vertebra, subsequent encounter for fracture with routine healing: Secondary | ICD-10-CM | POA: Diagnosis not present

## 2021-02-19 DIAGNOSIS — N1832 Chronic kidney disease, stage 3b: Secondary | ICD-10-CM | POA: Diagnosis not present

## 2021-02-19 DIAGNOSIS — M2578 Osteophyte, vertebrae: Secondary | ICD-10-CM | POA: Diagnosis not present

## 2021-02-20 ENCOUNTER — Ambulatory Visit: Payer: Medicare HMO | Admitting: Podiatry

## 2021-02-20 ENCOUNTER — Other Ambulatory Visit: Payer: Self-pay

## 2021-02-20 DIAGNOSIS — B351 Tinea unguium: Secondary | ICD-10-CM | POA: Diagnosis not present

## 2021-02-20 DIAGNOSIS — M79675 Pain in left toe(s): Secondary | ICD-10-CM | POA: Diagnosis not present

## 2021-02-20 DIAGNOSIS — M2578 Osteophyte, vertebrae: Secondary | ICD-10-CM | POA: Diagnosis not present

## 2021-02-20 DIAGNOSIS — N1832 Chronic kidney disease, stage 3b: Secondary | ICD-10-CM | POA: Diagnosis not present

## 2021-02-20 DIAGNOSIS — S12490D Other displaced fracture of fifth cervical vertebra, subsequent encounter for fracture with routine healing: Secondary | ICD-10-CM | POA: Diagnosis not present

## 2021-02-20 DIAGNOSIS — T796XXD Traumatic ischemia of muscle, subsequent encounter: Secondary | ICD-10-CM | POA: Diagnosis not present

## 2021-02-20 DIAGNOSIS — L89891 Pressure ulcer of other site, stage 1: Secondary | ICD-10-CM

## 2021-02-20 DIAGNOSIS — M4802 Spinal stenosis, cervical region: Secondary | ICD-10-CM | POA: Diagnosis not present

## 2021-02-20 DIAGNOSIS — I129 Hypertensive chronic kidney disease with stage 1 through stage 4 chronic kidney disease, or unspecified chronic kidney disease: Secondary | ICD-10-CM | POA: Diagnosis not present

## 2021-02-20 DIAGNOSIS — M79674 Pain in right toe(s): Secondary | ICD-10-CM

## 2021-02-20 DIAGNOSIS — S12590D Other displaced fracture of sixth cervical vertebra, subsequent encounter for fracture with routine healing: Secondary | ICD-10-CM | POA: Diagnosis not present

## 2021-02-20 DIAGNOSIS — M47812 Spondylosis without myelopathy or radiculopathy, cervical region: Secondary | ICD-10-CM | POA: Diagnosis not present

## 2021-02-20 DIAGNOSIS — E1122 Type 2 diabetes mellitus with diabetic chronic kidney disease: Secondary | ICD-10-CM | POA: Diagnosis not present

## 2021-02-21 ENCOUNTER — Encounter: Payer: Self-pay | Admitting: Podiatry

## 2021-02-21 DIAGNOSIS — S12590D Other displaced fracture of sixth cervical vertebra, subsequent encounter for fracture with routine healing: Secondary | ICD-10-CM | POA: Diagnosis not present

## 2021-02-21 DIAGNOSIS — M4802 Spinal stenosis, cervical region: Secondary | ICD-10-CM | POA: Diagnosis not present

## 2021-02-21 DIAGNOSIS — T796XXD Traumatic ischemia of muscle, subsequent encounter: Secondary | ICD-10-CM | POA: Diagnosis not present

## 2021-02-21 DIAGNOSIS — M47812 Spondylosis without myelopathy or radiculopathy, cervical region: Secondary | ICD-10-CM | POA: Diagnosis not present

## 2021-02-21 DIAGNOSIS — N1832 Chronic kidney disease, stage 3b: Secondary | ICD-10-CM | POA: Diagnosis not present

## 2021-02-21 DIAGNOSIS — S12490D Other displaced fracture of fifth cervical vertebra, subsequent encounter for fracture with routine healing: Secondary | ICD-10-CM | POA: Diagnosis not present

## 2021-02-21 DIAGNOSIS — E1122 Type 2 diabetes mellitus with diabetic chronic kidney disease: Secondary | ICD-10-CM | POA: Diagnosis not present

## 2021-02-21 DIAGNOSIS — I129 Hypertensive chronic kidney disease with stage 1 through stage 4 chronic kidney disease, or unspecified chronic kidney disease: Secondary | ICD-10-CM | POA: Diagnosis not present

## 2021-02-21 DIAGNOSIS — M2578 Osteophyte, vertebrae: Secondary | ICD-10-CM | POA: Diagnosis not present

## 2021-02-21 NOTE — Progress Notes (Signed)
  Subjective:  Patient ID: Jasmine Bridges, female    DOB: [birth year],  MRN: [id number]  Chief Complaint  Patient presents with   Nail Problem    Nail trim    87 y.o. female returns for the above complaint.  Patient presents with complaint of thickened elongated dystrophic toenails x10.  Patient states is painful to touch.  She states that she would like to have them debrided down.  She has not been seen by anyone else prior to seeing me.  She has secondary complaint of pain to the distal tip of the hallux.  Patient states it hurts mostly at nighttime.  She is noticing a little bit of a sore spot.  She does wear shoes that are a little bit tighter.  She denies any other acute complaint she wanted to know if there is anything that can be done for her.  Objective:  There were no vitals filed for this visit. Podiatric Exam: Vascular: dorsalis pedis and posterior tibial pulses are palpable bilateral. Capillary return is immediate. Temperature gradient is WNL. Skin turgor WNL  Sensorium: Normal Semmes Weinstein monofilament test. Normal tactile sensation bilaterally. Nail Exam: Pt has thick disfigured discolored nails with subungual debris noted bilateral entire nail hallux through fifth toenails.  Pain on palpation to the nails. Ulcer Exam: There is no evidence of ulcer or pre-ulcerative changes or infection. Orthopedic Exam: Muscle tone and strength are WNL. No limitations in general ROM. No crepitus or effusions noted. HAV  B/L.  Hammer toes 2-5  B/L. Skin: No Porokeratosis. No infection or ulcers.  Superficial pressure sore noted to the distal tip of the left hallux.  Mild pain on palpation.  No open wounds or lesions noted.    Assessment & Plan:   1. Pressure injury of toe of left foot, stage 1   2. Pain due to onychomycosis of toenails of both feet      Patient was evaluated and treated and all questions answered.  Left hallux pressure injury superficial -I explained to the patient the  etiology of pressure sore and various treatment option.  I discussed with the patient the importance of shoe gear modification to decrease the pressure of the tip of the toe.  Patient states understanding will work on that immediately.  I have asked her to keep it covered with a Band-Aid.  At this time there is no open wounds or lesions and not concern for any aggressive wound care.  No clinical signs of infection noted.  Generalized edema to both lower extremity 1+ pitting -I improving  Xerosis bilateral lower extremity -Clinically improving  Onychomycosis with pain  -Nails palliatively debrided as below. -Educated on self-care  Procedure: Nail Debridement Rationale: pain  Type of Debridement: manual, sharp debridement. Instrumentation: Nail nipper, rotary burr. Number of Nails: 10  Procedures and Treatment: Consent by patient was obtained for treatment procedures. The patient understood the discussion of treatment and procedures well. All questions were answered thoroughly reviewed. Debridement of mycotic and hypertrophic toenails, 1 through 5 bilateral and clearing of subungual debris. No ulceration, no infection noted.  Return Visit-Office Procedure: Patient instructed to return to the office for a follow up visit 3 months for continued evaluation and treatment.  Boneta Lucks, DPM    No follow-ups on file.

## 2021-02-25 ENCOUNTER — Ambulatory Visit: Payer: Medicare HMO | Admitting: Urology

## 2021-02-25 DIAGNOSIS — N1832 Chronic kidney disease, stage 3b: Secondary | ICD-10-CM | POA: Diagnosis not present

## 2021-02-25 DIAGNOSIS — M47812 Spondylosis without myelopathy or radiculopathy, cervical region: Secondary | ICD-10-CM | POA: Diagnosis not present

## 2021-02-25 DIAGNOSIS — M2578 Osteophyte, vertebrae: Secondary | ICD-10-CM | POA: Diagnosis not present

## 2021-02-25 DIAGNOSIS — S12590D Other displaced fracture of sixth cervical vertebra, subsequent encounter for fracture with routine healing: Secondary | ICD-10-CM | POA: Diagnosis not present

## 2021-02-25 DIAGNOSIS — S12490D Other displaced fracture of fifth cervical vertebra, subsequent encounter for fracture with routine healing: Secondary | ICD-10-CM | POA: Diagnosis not present

## 2021-02-25 DIAGNOSIS — T796XXD Traumatic ischemia of muscle, subsequent encounter: Secondary | ICD-10-CM | POA: Diagnosis not present

## 2021-02-25 DIAGNOSIS — E1122 Type 2 diabetes mellitus with diabetic chronic kidney disease: Secondary | ICD-10-CM | POA: Diagnosis not present

## 2021-02-25 DIAGNOSIS — M4802 Spinal stenosis, cervical region: Secondary | ICD-10-CM | POA: Diagnosis not present

## 2021-02-25 DIAGNOSIS — I129 Hypertensive chronic kidney disease with stage 1 through stage 4 chronic kidney disease, or unspecified chronic kidney disease: Secondary | ICD-10-CM | POA: Diagnosis not present

## 2021-02-26 DIAGNOSIS — S12490D Other displaced fracture of fifth cervical vertebra, subsequent encounter for fracture with routine healing: Secondary | ICD-10-CM | POA: Diagnosis not present

## 2021-02-26 DIAGNOSIS — I129 Hypertensive chronic kidney disease with stage 1 through stage 4 chronic kidney disease, or unspecified chronic kidney disease: Secondary | ICD-10-CM | POA: Diagnosis not present

## 2021-02-26 DIAGNOSIS — S12590D Other displaced fracture of sixth cervical vertebra, subsequent encounter for fracture with routine healing: Secondary | ICD-10-CM | POA: Diagnosis not present

## 2021-02-26 DIAGNOSIS — T796XXD Traumatic ischemia of muscle, subsequent encounter: Secondary | ICD-10-CM | POA: Diagnosis not present

## 2021-02-26 DIAGNOSIS — E1122 Type 2 diabetes mellitus with diabetic chronic kidney disease: Secondary | ICD-10-CM | POA: Diagnosis not present

## 2021-02-26 DIAGNOSIS — M4802 Spinal stenosis, cervical region: Secondary | ICD-10-CM | POA: Diagnosis not present

## 2021-02-26 DIAGNOSIS — N1832 Chronic kidney disease, stage 3b: Secondary | ICD-10-CM | POA: Diagnosis not present

## 2021-02-26 DIAGNOSIS — M47812 Spondylosis without myelopathy or radiculopathy, cervical region: Secondary | ICD-10-CM | POA: Diagnosis not present

## 2021-02-26 DIAGNOSIS — M2578 Osteophyte, vertebrae: Secondary | ICD-10-CM | POA: Diagnosis not present

## 2021-02-27 DIAGNOSIS — E1122 Type 2 diabetes mellitus with diabetic chronic kidney disease: Secondary | ICD-10-CM | POA: Diagnosis not present

## 2021-02-27 DIAGNOSIS — N1832 Chronic kidney disease, stage 3b: Secondary | ICD-10-CM | POA: Diagnosis not present

## 2021-02-27 DIAGNOSIS — T796XXD Traumatic ischemia of muscle, subsequent encounter: Secondary | ICD-10-CM | POA: Diagnosis not present

## 2021-02-27 DIAGNOSIS — S12490D Other displaced fracture of fifth cervical vertebra, subsequent encounter for fracture with routine healing: Secondary | ICD-10-CM | POA: Diagnosis not present

## 2021-02-27 DIAGNOSIS — S12590D Other displaced fracture of sixth cervical vertebra, subsequent encounter for fracture with routine healing: Secondary | ICD-10-CM | POA: Diagnosis not present

## 2021-02-27 DIAGNOSIS — M2578 Osteophyte, vertebrae: Secondary | ICD-10-CM | POA: Diagnosis not present

## 2021-02-27 DIAGNOSIS — I129 Hypertensive chronic kidney disease with stage 1 through stage 4 chronic kidney disease, or unspecified chronic kidney disease: Secondary | ICD-10-CM | POA: Diagnosis not present

## 2021-02-27 DIAGNOSIS — M47812 Spondylosis without myelopathy or radiculopathy, cervical region: Secondary | ICD-10-CM | POA: Diagnosis not present

## 2021-02-27 DIAGNOSIS — M4802 Spinal stenosis, cervical region: Secondary | ICD-10-CM | POA: Diagnosis not present

## 2021-03-01 DIAGNOSIS — E1122 Type 2 diabetes mellitus with diabetic chronic kidney disease: Secondary | ICD-10-CM | POA: Diagnosis not present

## 2021-03-01 DIAGNOSIS — N1832 Chronic kidney disease, stage 3b: Secondary | ICD-10-CM | POA: Diagnosis not present

## 2021-03-01 DIAGNOSIS — M2578 Osteophyte, vertebrae: Secondary | ICD-10-CM | POA: Diagnosis not present

## 2021-03-01 DIAGNOSIS — S12590D Other displaced fracture of sixth cervical vertebra, subsequent encounter for fracture with routine healing: Secondary | ICD-10-CM | POA: Diagnosis not present

## 2021-03-01 DIAGNOSIS — M47812 Spondylosis without myelopathy or radiculopathy, cervical region: Secondary | ICD-10-CM | POA: Diagnosis not present

## 2021-03-01 DIAGNOSIS — M4802 Spinal stenosis, cervical region: Secondary | ICD-10-CM | POA: Diagnosis not present

## 2021-03-01 DIAGNOSIS — T796XXD Traumatic ischemia of muscle, subsequent encounter: Secondary | ICD-10-CM | POA: Diagnosis not present

## 2021-03-01 DIAGNOSIS — S12490D Other displaced fracture of fifth cervical vertebra, subsequent encounter for fracture with routine healing: Secondary | ICD-10-CM | POA: Diagnosis not present

## 2021-03-01 DIAGNOSIS — I129 Hypertensive chronic kidney disease with stage 1 through stage 4 chronic kidney disease, or unspecified chronic kidney disease: Secondary | ICD-10-CM | POA: Diagnosis not present

## 2021-03-04 DIAGNOSIS — N1832 Chronic kidney disease, stage 3b: Secondary | ICD-10-CM | POA: Diagnosis not present

## 2021-03-04 DIAGNOSIS — T796XXD Traumatic ischemia of muscle, subsequent encounter: Secondary | ICD-10-CM | POA: Diagnosis not present

## 2021-03-04 DIAGNOSIS — M2578 Osteophyte, vertebrae: Secondary | ICD-10-CM | POA: Diagnosis not present

## 2021-03-04 DIAGNOSIS — S12590D Other displaced fracture of sixth cervical vertebra, subsequent encounter for fracture with routine healing: Secondary | ICD-10-CM | POA: Diagnosis not present

## 2021-03-04 DIAGNOSIS — M4802 Spinal stenosis, cervical region: Secondary | ICD-10-CM | POA: Diagnosis not present

## 2021-03-04 DIAGNOSIS — S12490D Other displaced fracture of fifth cervical vertebra, subsequent encounter for fracture with routine healing: Secondary | ICD-10-CM | POA: Diagnosis not present

## 2021-03-04 DIAGNOSIS — M47812 Spondylosis without myelopathy or radiculopathy, cervical region: Secondary | ICD-10-CM | POA: Diagnosis not present

## 2021-03-04 DIAGNOSIS — I129 Hypertensive chronic kidney disease with stage 1 through stage 4 chronic kidney disease, or unspecified chronic kidney disease: Secondary | ICD-10-CM | POA: Diagnosis not present

## 2021-03-04 DIAGNOSIS — E1122 Type 2 diabetes mellitus with diabetic chronic kidney disease: Secondary | ICD-10-CM | POA: Diagnosis not present

## 2021-03-05 DIAGNOSIS — N1832 Chronic kidney disease, stage 3b: Secondary | ICD-10-CM | POA: Diagnosis not present

## 2021-03-05 DIAGNOSIS — I129 Hypertensive chronic kidney disease with stage 1 through stage 4 chronic kidney disease, or unspecified chronic kidney disease: Secondary | ICD-10-CM | POA: Diagnosis not present

## 2021-03-05 DIAGNOSIS — M4802 Spinal stenosis, cervical region: Secondary | ICD-10-CM | POA: Diagnosis not present

## 2021-03-05 DIAGNOSIS — E1122 Type 2 diabetes mellitus with diabetic chronic kidney disease: Secondary | ICD-10-CM | POA: Diagnosis not present

## 2021-03-05 DIAGNOSIS — S12490D Other displaced fracture of fifth cervical vertebra, subsequent encounter for fracture with routine healing: Secondary | ICD-10-CM | POA: Diagnosis not present

## 2021-03-05 DIAGNOSIS — M2578 Osteophyte, vertebrae: Secondary | ICD-10-CM | POA: Diagnosis not present

## 2021-03-05 DIAGNOSIS — M47812 Spondylosis without myelopathy or radiculopathy, cervical region: Secondary | ICD-10-CM | POA: Diagnosis not present

## 2021-03-05 DIAGNOSIS — S12590D Other displaced fracture of sixth cervical vertebra, subsequent encounter for fracture with routine healing: Secondary | ICD-10-CM | POA: Diagnosis not present

## 2021-03-05 DIAGNOSIS — T796XXD Traumatic ischemia of muscle, subsequent encounter: Secondary | ICD-10-CM | POA: Diagnosis not present

## 2021-03-06 DIAGNOSIS — T796XXD Traumatic ischemia of muscle, subsequent encounter: Secondary | ICD-10-CM | POA: Diagnosis not present

## 2021-03-06 DIAGNOSIS — S12590D Other displaced fracture of sixth cervical vertebra, subsequent encounter for fracture with routine healing: Secondary | ICD-10-CM | POA: Diagnosis not present

## 2021-03-06 DIAGNOSIS — M2578 Osteophyte, vertebrae: Secondary | ICD-10-CM | POA: Diagnosis not present

## 2021-03-06 DIAGNOSIS — N1832 Chronic kidney disease, stage 3b: Secondary | ICD-10-CM | POA: Diagnosis not present

## 2021-03-06 DIAGNOSIS — I129 Hypertensive chronic kidney disease with stage 1 through stage 4 chronic kidney disease, or unspecified chronic kidney disease: Secondary | ICD-10-CM | POA: Diagnosis not present

## 2021-03-06 DIAGNOSIS — E1122 Type 2 diabetes mellitus with diabetic chronic kidney disease: Secondary | ICD-10-CM | POA: Diagnosis not present

## 2021-03-06 DIAGNOSIS — M47812 Spondylosis without myelopathy or radiculopathy, cervical region: Secondary | ICD-10-CM | POA: Diagnosis not present

## 2021-03-06 DIAGNOSIS — S12490D Other displaced fracture of fifth cervical vertebra, subsequent encounter for fracture with routine healing: Secondary | ICD-10-CM | POA: Diagnosis not present

## 2021-03-06 DIAGNOSIS — M4802 Spinal stenosis, cervical region: Secondary | ICD-10-CM | POA: Diagnosis not present

## 2021-03-07 DIAGNOSIS — E1122 Type 2 diabetes mellitus with diabetic chronic kidney disease: Secondary | ICD-10-CM | POA: Diagnosis not present

## 2021-03-07 DIAGNOSIS — M47812 Spondylosis without myelopathy or radiculopathy, cervical region: Secondary | ICD-10-CM | POA: Diagnosis not present

## 2021-03-07 DIAGNOSIS — S12490D Other displaced fracture of fifth cervical vertebra, subsequent encounter for fracture with routine healing: Secondary | ICD-10-CM | POA: Diagnosis not present

## 2021-03-07 DIAGNOSIS — M4802 Spinal stenosis, cervical region: Secondary | ICD-10-CM | POA: Diagnosis not present

## 2021-03-07 DIAGNOSIS — N1832 Chronic kidney disease, stage 3b: Secondary | ICD-10-CM | POA: Diagnosis not present

## 2021-03-07 DIAGNOSIS — M2578 Osteophyte, vertebrae: Secondary | ICD-10-CM | POA: Diagnosis not present

## 2021-03-07 DIAGNOSIS — T796XXD Traumatic ischemia of muscle, subsequent encounter: Secondary | ICD-10-CM | POA: Diagnosis not present

## 2021-03-07 DIAGNOSIS — I129 Hypertensive chronic kidney disease with stage 1 through stage 4 chronic kidney disease, or unspecified chronic kidney disease: Secondary | ICD-10-CM | POA: Diagnosis not present

## 2021-03-07 DIAGNOSIS — S12590D Other displaced fracture of sixth cervical vertebra, subsequent encounter for fracture with routine healing: Secondary | ICD-10-CM | POA: Diagnosis not present

## 2021-03-11 DIAGNOSIS — S12590D Other displaced fracture of sixth cervical vertebra, subsequent encounter for fracture with routine healing: Secondary | ICD-10-CM | POA: Diagnosis not present

## 2021-03-11 DIAGNOSIS — N1832 Chronic kidney disease, stage 3b: Secondary | ICD-10-CM | POA: Diagnosis not present

## 2021-03-11 DIAGNOSIS — T796XXD Traumatic ischemia of muscle, subsequent encounter: Secondary | ICD-10-CM | POA: Diagnosis not present

## 2021-03-11 DIAGNOSIS — M47812 Spondylosis without myelopathy or radiculopathy, cervical region: Secondary | ICD-10-CM | POA: Diagnosis not present

## 2021-03-11 DIAGNOSIS — M2578 Osteophyte, vertebrae: Secondary | ICD-10-CM | POA: Diagnosis not present

## 2021-03-11 DIAGNOSIS — E1122 Type 2 diabetes mellitus with diabetic chronic kidney disease: Secondary | ICD-10-CM | POA: Diagnosis not present

## 2021-03-11 DIAGNOSIS — M4802 Spinal stenosis, cervical region: Secondary | ICD-10-CM | POA: Diagnosis not present

## 2021-03-11 DIAGNOSIS — I129 Hypertensive chronic kidney disease with stage 1 through stage 4 chronic kidney disease, or unspecified chronic kidney disease: Secondary | ICD-10-CM | POA: Diagnosis not present

## 2021-03-11 DIAGNOSIS — S12490D Other displaced fracture of fifth cervical vertebra, subsequent encounter for fracture with routine healing: Secondary | ICD-10-CM | POA: Diagnosis not present

## 2021-03-12 DIAGNOSIS — M2578 Osteophyte, vertebrae: Secondary | ICD-10-CM | POA: Diagnosis not present

## 2021-03-12 DIAGNOSIS — E1122 Type 2 diabetes mellitus with diabetic chronic kidney disease: Secondary | ICD-10-CM | POA: Diagnosis not present

## 2021-03-12 DIAGNOSIS — M47812 Spondylosis without myelopathy or radiculopathy, cervical region: Secondary | ICD-10-CM | POA: Diagnosis not present

## 2021-03-12 DIAGNOSIS — M4802 Spinal stenosis, cervical region: Secondary | ICD-10-CM | POA: Diagnosis not present

## 2021-03-12 DIAGNOSIS — S12590D Other displaced fracture of sixth cervical vertebra, subsequent encounter for fracture with routine healing: Secondary | ICD-10-CM | POA: Diagnosis not present

## 2021-03-12 DIAGNOSIS — S12490D Other displaced fracture of fifth cervical vertebra, subsequent encounter for fracture with routine healing: Secondary | ICD-10-CM | POA: Diagnosis not present

## 2021-03-12 DIAGNOSIS — I129 Hypertensive chronic kidney disease with stage 1 through stage 4 chronic kidney disease, or unspecified chronic kidney disease: Secondary | ICD-10-CM | POA: Diagnosis not present

## 2021-03-12 DIAGNOSIS — N1832 Chronic kidney disease, stage 3b: Secondary | ICD-10-CM | POA: Diagnosis not present

## 2021-03-12 DIAGNOSIS — T796XXD Traumatic ischemia of muscle, subsequent encounter: Secondary | ICD-10-CM | POA: Diagnosis not present

## 2021-03-13 DIAGNOSIS — M4802 Spinal stenosis, cervical region: Secondary | ICD-10-CM | POA: Diagnosis not present

## 2021-03-13 DIAGNOSIS — S12490D Other displaced fracture of fifth cervical vertebra, subsequent encounter for fracture with routine healing: Secondary | ICD-10-CM | POA: Diagnosis not present

## 2021-03-13 DIAGNOSIS — N1832 Chronic kidney disease, stage 3b: Secondary | ICD-10-CM | POA: Diagnosis not present

## 2021-03-13 DIAGNOSIS — M2578 Osteophyte, vertebrae: Secondary | ICD-10-CM | POA: Diagnosis not present

## 2021-03-13 DIAGNOSIS — M47812 Spondylosis without myelopathy or radiculopathy, cervical region: Secondary | ICD-10-CM | POA: Diagnosis not present

## 2021-03-13 DIAGNOSIS — E1122 Type 2 diabetes mellitus with diabetic chronic kidney disease: Secondary | ICD-10-CM | POA: Diagnosis not present

## 2021-03-13 DIAGNOSIS — T796XXD Traumatic ischemia of muscle, subsequent encounter: Secondary | ICD-10-CM | POA: Diagnosis not present

## 2021-03-13 DIAGNOSIS — I129 Hypertensive chronic kidney disease with stage 1 through stage 4 chronic kidney disease, or unspecified chronic kidney disease: Secondary | ICD-10-CM | POA: Diagnosis not present

## 2021-03-13 DIAGNOSIS — S12590D Other displaced fracture of sixth cervical vertebra, subsequent encounter for fracture with routine healing: Secondary | ICD-10-CM | POA: Diagnosis not present

## 2021-03-14 DIAGNOSIS — E1122 Type 2 diabetes mellitus with diabetic chronic kidney disease: Secondary | ICD-10-CM | POA: Diagnosis not present

## 2021-03-14 DIAGNOSIS — N1832 Chronic kidney disease, stage 3b: Secondary | ICD-10-CM | POA: Diagnosis not present

## 2021-03-14 DIAGNOSIS — S12490D Other displaced fracture of fifth cervical vertebra, subsequent encounter for fracture with routine healing: Secondary | ICD-10-CM | POA: Diagnosis not present

## 2021-03-14 DIAGNOSIS — T796XXD Traumatic ischemia of muscle, subsequent encounter: Secondary | ICD-10-CM | POA: Diagnosis not present

## 2021-03-14 DIAGNOSIS — M2578 Osteophyte, vertebrae: Secondary | ICD-10-CM | POA: Diagnosis not present

## 2021-03-14 DIAGNOSIS — M4802 Spinal stenosis, cervical region: Secondary | ICD-10-CM | POA: Diagnosis not present

## 2021-03-14 DIAGNOSIS — S12590D Other displaced fracture of sixth cervical vertebra, subsequent encounter for fracture with routine healing: Secondary | ICD-10-CM | POA: Diagnosis not present

## 2021-03-14 DIAGNOSIS — M47812 Spondylosis without myelopathy or radiculopathy, cervical region: Secondary | ICD-10-CM | POA: Diagnosis not present

## 2021-03-14 DIAGNOSIS — I129 Hypertensive chronic kidney disease with stage 1 through stage 4 chronic kidney disease, or unspecified chronic kidney disease: Secondary | ICD-10-CM | POA: Diagnosis not present

## 2021-03-15 DIAGNOSIS — T796XXD Traumatic ischemia of muscle, subsequent encounter: Secondary | ICD-10-CM | POA: Diagnosis not present

## 2021-03-15 DIAGNOSIS — I129 Hypertensive chronic kidney disease with stage 1 through stage 4 chronic kidney disease, or unspecified chronic kidney disease: Secondary | ICD-10-CM | POA: Diagnosis not present

## 2021-03-15 DIAGNOSIS — S12590D Other displaced fracture of sixth cervical vertebra, subsequent encounter for fracture with routine healing: Secondary | ICD-10-CM | POA: Diagnosis not present

## 2021-03-15 DIAGNOSIS — M2578 Osteophyte, vertebrae: Secondary | ICD-10-CM | POA: Diagnosis not present

## 2021-03-15 DIAGNOSIS — E1122 Type 2 diabetes mellitus with diabetic chronic kidney disease: Secondary | ICD-10-CM | POA: Diagnosis not present

## 2021-03-15 DIAGNOSIS — M47812 Spondylosis without myelopathy or radiculopathy, cervical region: Secondary | ICD-10-CM | POA: Diagnosis not present

## 2021-03-15 DIAGNOSIS — N1832 Chronic kidney disease, stage 3b: Secondary | ICD-10-CM | POA: Diagnosis not present

## 2021-03-15 DIAGNOSIS — M4802 Spinal stenosis, cervical region: Secondary | ICD-10-CM | POA: Diagnosis not present

## 2021-03-15 DIAGNOSIS — S12490D Other displaced fracture of fifth cervical vertebra, subsequent encounter for fracture with routine healing: Secondary | ICD-10-CM | POA: Diagnosis not present

## 2021-03-18 DIAGNOSIS — N1832 Chronic kidney disease, stage 3b: Secondary | ICD-10-CM | POA: Diagnosis not present

## 2021-03-18 DIAGNOSIS — S12590D Other displaced fracture of sixth cervical vertebra, subsequent encounter for fracture with routine healing: Secondary | ICD-10-CM | POA: Diagnosis not present

## 2021-03-18 DIAGNOSIS — T796XXD Traumatic ischemia of muscle, subsequent encounter: Secondary | ICD-10-CM | POA: Diagnosis not present

## 2021-03-18 DIAGNOSIS — M47812 Spondylosis without myelopathy or radiculopathy, cervical region: Secondary | ICD-10-CM | POA: Diagnosis not present

## 2021-03-18 DIAGNOSIS — M2578 Osteophyte, vertebrae: Secondary | ICD-10-CM | POA: Diagnosis not present

## 2021-03-18 DIAGNOSIS — M4802 Spinal stenosis, cervical region: Secondary | ICD-10-CM | POA: Diagnosis not present

## 2021-03-18 DIAGNOSIS — S12490D Other displaced fracture of fifth cervical vertebra, subsequent encounter for fracture with routine healing: Secondary | ICD-10-CM | POA: Diagnosis not present

## 2021-03-18 DIAGNOSIS — I129 Hypertensive chronic kidney disease with stage 1 through stage 4 chronic kidney disease, or unspecified chronic kidney disease: Secondary | ICD-10-CM | POA: Diagnosis not present

## 2021-03-18 DIAGNOSIS — E1122 Type 2 diabetes mellitus with diabetic chronic kidney disease: Secondary | ICD-10-CM | POA: Diagnosis not present

## 2021-03-19 DIAGNOSIS — I129 Hypertensive chronic kidney disease with stage 1 through stage 4 chronic kidney disease, or unspecified chronic kidney disease: Secondary | ICD-10-CM | POA: Diagnosis not present

## 2021-03-19 DIAGNOSIS — M2578 Osteophyte, vertebrae: Secondary | ICD-10-CM | POA: Diagnosis not present

## 2021-03-19 DIAGNOSIS — E1122 Type 2 diabetes mellitus with diabetic chronic kidney disease: Secondary | ICD-10-CM | POA: Diagnosis not present

## 2021-03-19 DIAGNOSIS — M47812 Spondylosis without myelopathy or radiculopathy, cervical region: Secondary | ICD-10-CM | POA: Diagnosis not present

## 2021-03-19 DIAGNOSIS — T796XXD Traumatic ischemia of muscle, subsequent encounter: Secondary | ICD-10-CM | POA: Diagnosis not present

## 2021-03-19 DIAGNOSIS — S12590D Other displaced fracture of sixth cervical vertebra, subsequent encounter for fracture with routine healing: Secondary | ICD-10-CM | POA: Diagnosis not present

## 2021-03-19 DIAGNOSIS — M4802 Spinal stenosis, cervical region: Secondary | ICD-10-CM | POA: Diagnosis not present

## 2021-03-19 DIAGNOSIS — S12490D Other displaced fracture of fifth cervical vertebra, subsequent encounter for fracture with routine healing: Secondary | ICD-10-CM | POA: Diagnosis not present

## 2021-03-19 DIAGNOSIS — N1832 Chronic kidney disease, stage 3b: Secondary | ICD-10-CM | POA: Diagnosis not present

## 2021-03-27 DIAGNOSIS — I10 Essential (primary) hypertension: Secondary | ICD-10-CM | POA: Diagnosis not present

## 2021-03-27 DIAGNOSIS — I129 Hypertensive chronic kidney disease with stage 1 through stage 4 chronic kidney disease, or unspecified chronic kidney disease: Secondary | ICD-10-CM | POA: Diagnosis not present

## 2021-04-27 DEATH — deceased

## 2021-05-29 ENCOUNTER — Ambulatory Visit: Payer: Self-pay | Admitting: Podiatry

## 2021-07-04 IMAGING — US US EXTREM LOW VENOUS
1 series · 13 of 24 positions shown · non-contrast
Comparison: None.

CLINICAL DATA: Bilateral leg pain.  Rule out DVT.



[Series 1: us venous img lower bilat (dvt) · portal-venous · 13 of 89 slices shown]
[im 1/89]
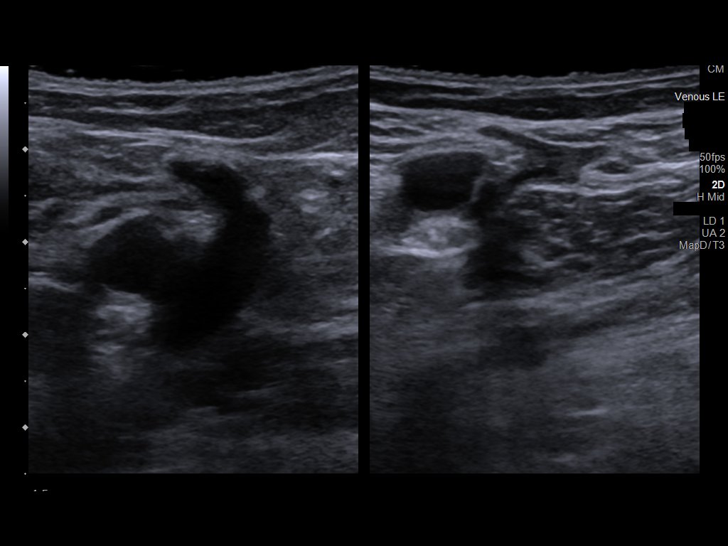
[im 8/89]
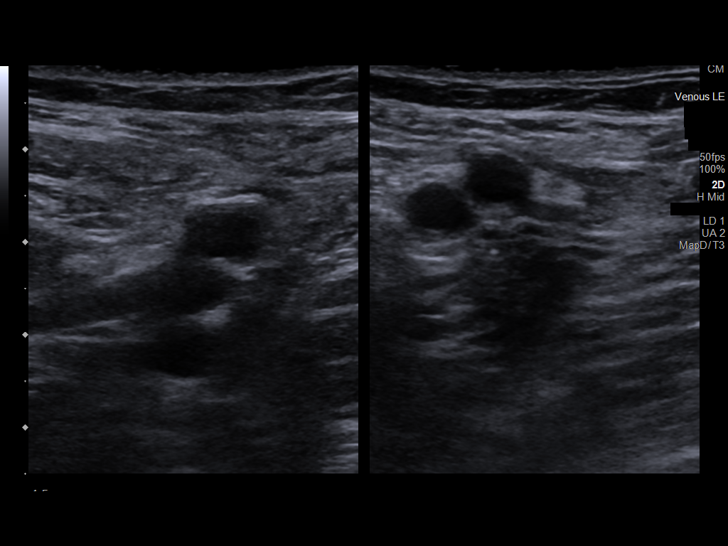
[im 16/89]
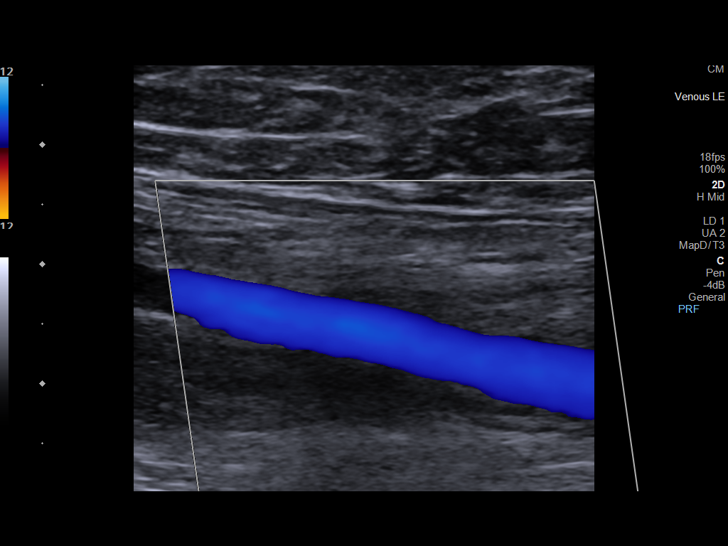
[im 23/89]
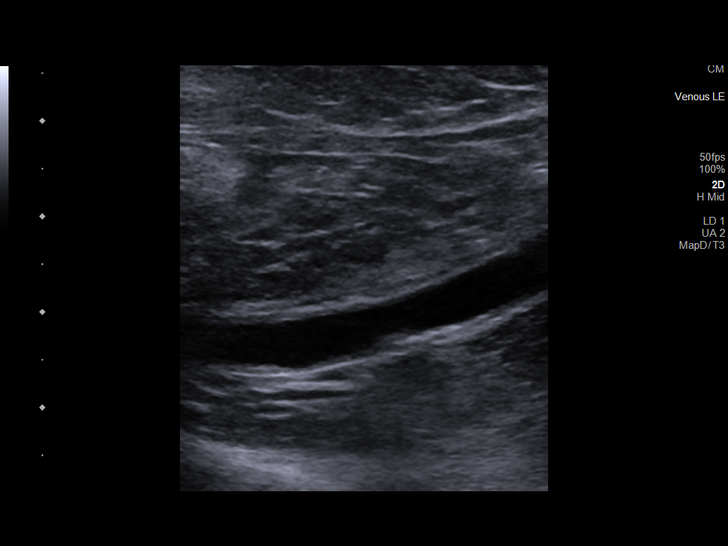
[im 31/89]
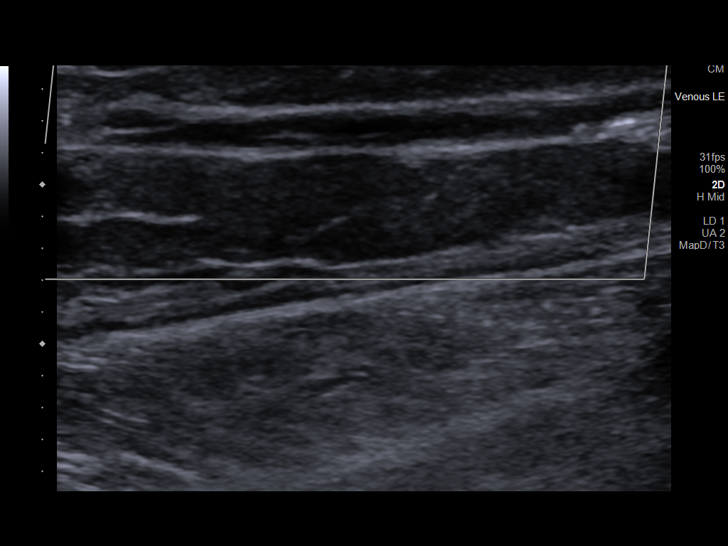
[im 39/89]
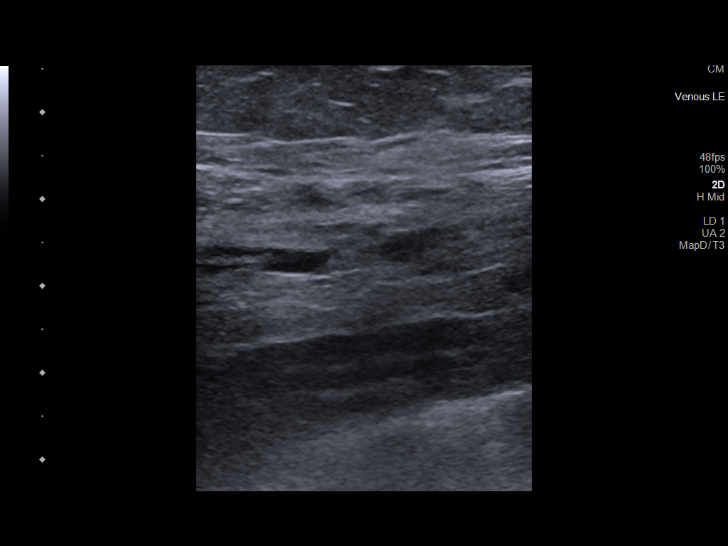
[im 46/89]
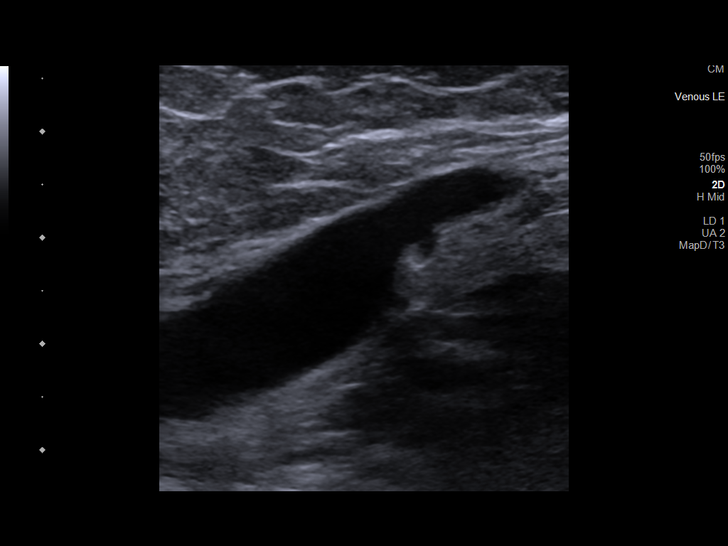
[im 50/89]
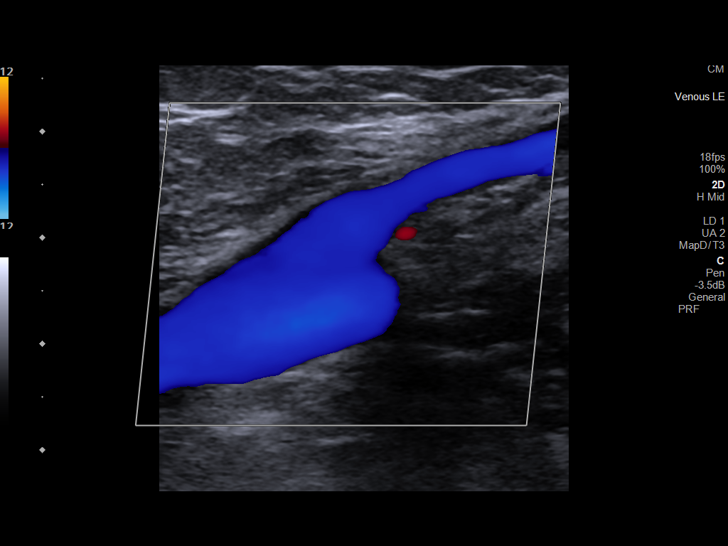
[im 58/89]
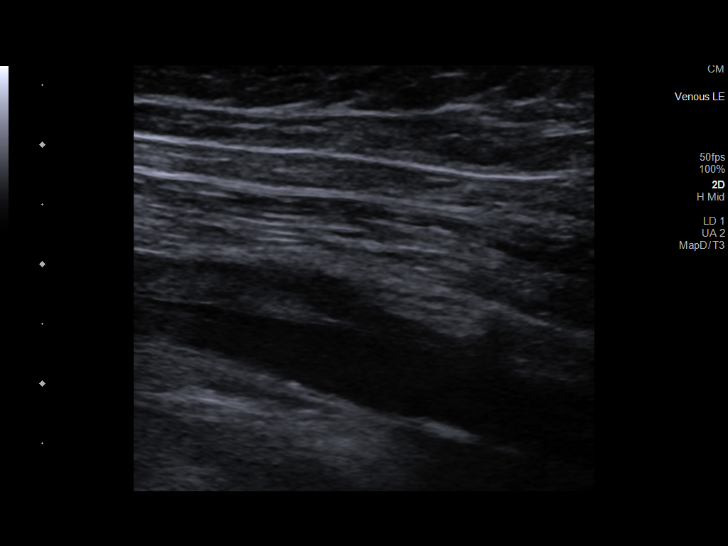
[im 66/89]
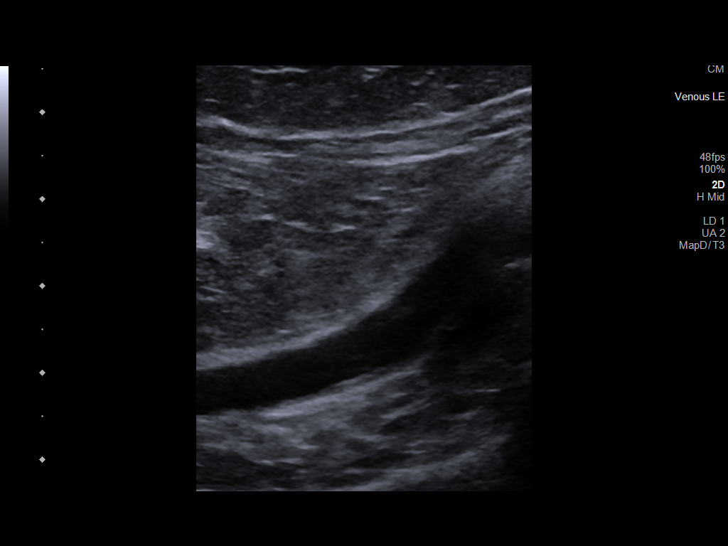
[im 73/89]
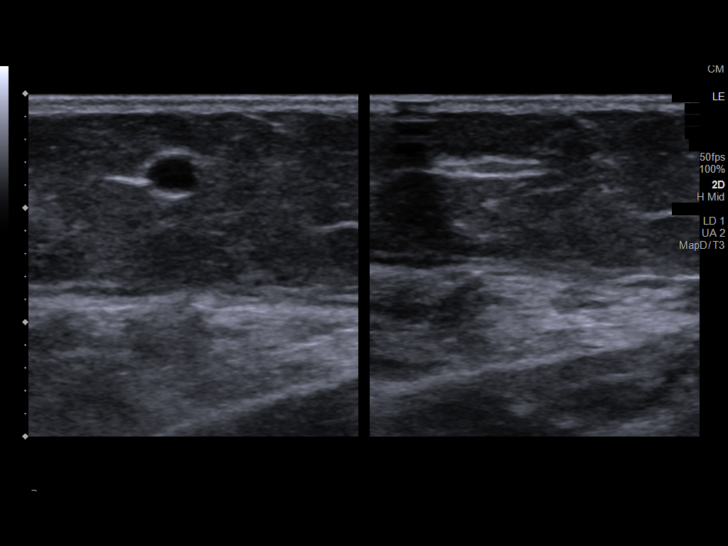
[im 81/89]
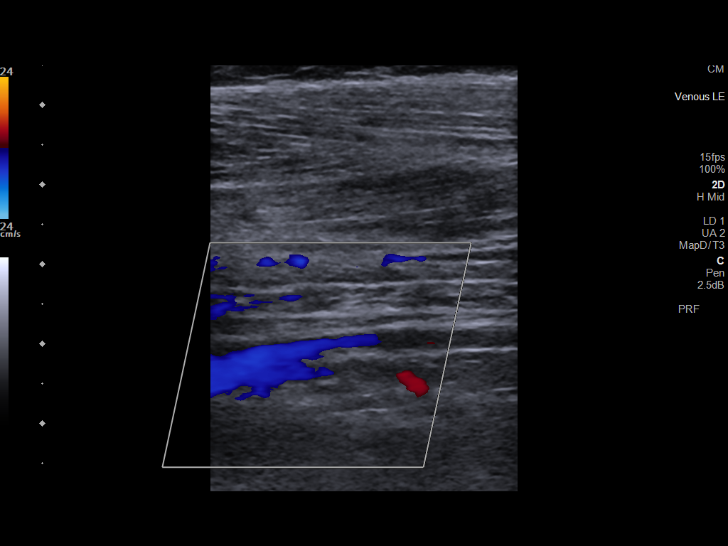
[im 89/89]
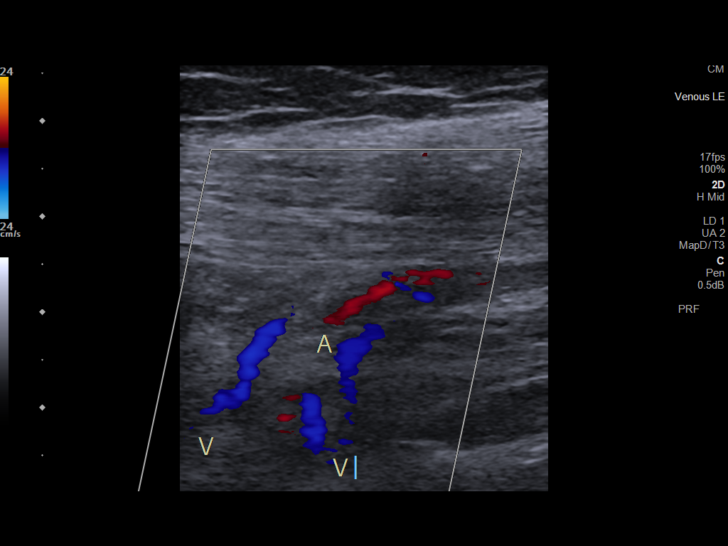

[13 of 24 positions shown; findings below may reference images not displayed]

FINDINGS: RIGHT LOWER EXTREMITY

Common Femoral Vein: No evidence of thrombus. Normal
compressibility, respiratory phasicity and response to augmentation.

Saphenofemoral Junction: No evidence of thrombus. Normal
compressibility and flow on color Doppler imaging.

Profunda Femoral Vein: No evidence of thrombus. Normal
compressibility and flow on color Doppler imaging.

Femoral Vein: No evidence of thrombus. Normal compressibility,
respiratory phasicity and response to augmentation.

Popliteal Vein: No evidence of thrombus. Normal compressibility,
respiratory phasicity and response to augmentation.

Calf Veins: No evidence of thrombus. Normal compressibility and flow
on color Doppler imaging.

Superficial Great Saphenous Vein: No evidence of thrombus. Normal
compressibility.

Venous Reflux:  None.

Other Findings:  None.

LEFT LOWER EXTREMITY

Common Femoral Vein: No evidence of thrombus. Normal
compressibility, respiratory phasicity and response to augmentation.

Saphenofemoral Junction: No evidence of thrombus. Normal
compressibility and flow on color Doppler imaging.

Profunda Femoral Vein: No evidence of thrombus. Normal
compressibility and flow on color Doppler imaging.

Femoral Vein: No evidence of thrombus. Normal compressibility,
respiratory phasicity and response to augmentation.

Popliteal Vein: No evidence of thrombus. Normal compressibility,
respiratory phasicity and response to augmentation.

Calf Veins: No evidence of thrombus. Normal compressibility and flow
on color Doppler imaging.

Superficial Great Saphenous Vein: No evidence of thrombus. Normal
compressibility.

Venous Reflux:  None.

Other Findings:  None.
IMPRESSION: No evidence of deep venous thrombosis in either lower extremity.
